# Patient Record
Sex: Female | Born: 1970 | Race: Black or African American | Hispanic: No | Marital: Single | Smoking: Current every day smoker
Health system: Southern US, Community
[De-identification: ages and names within clinical notes are randomized; demographics above are authoritative.]

## PROBLEM LIST (undated history)

## (undated) DIAGNOSIS — I10 Essential (primary) hypertension: Secondary | ICD-10-CM

## (undated) DIAGNOSIS — E119 Type 2 diabetes mellitus without complications: Secondary | ICD-10-CM

## (undated) HISTORY — PX: TUBAL LIGATION: SHX77

---

## 2001-02-19 ENCOUNTER — Inpatient Hospital Stay (HOSPITAL_COMMUNITY): Admission: RE | Admit: 2001-02-19 | Discharge: 2001-02-21 | Payer: Self-pay | Admitting: Obstetrics & Gynecology

## 2001-04-02 ENCOUNTER — Ambulatory Visit (HOSPITAL_COMMUNITY): Admission: RE | Admit: 2001-04-02 | Discharge: 2001-04-02 | Payer: Self-pay | Admitting: Obstetrics & Gynecology

## 2002-06-21 ENCOUNTER — Encounter: Payer: Self-pay | Admitting: Emergency Medicine

## 2002-06-21 ENCOUNTER — Emergency Department (HOSPITAL_COMMUNITY): Admission: EM | Admit: 2002-06-21 | Discharge: 2002-06-21 | Payer: Self-pay | Admitting: Emergency Medicine

## 2010-05-25 ENCOUNTER — Emergency Department (HOSPITAL_COMMUNITY)
Admission: EM | Admit: 2010-05-25 | Discharge: 2010-05-25 | Disposition: A | Discharge status: Home / Self Care | Payer: Self-pay | Attending: Emergency Medicine | Admitting: Emergency Medicine

## 2010-05-25 DIAGNOSIS — L03211 Cellulitis of face: Secondary | ICD-10-CM | POA: Insufficient documentation

## 2010-05-25 DIAGNOSIS — L0201 Cutaneous abscess of face: Secondary | ICD-10-CM | POA: Insufficient documentation

## 2010-05-25 DIAGNOSIS — R51 Headache: Secondary | ICD-10-CM | POA: Insufficient documentation

## 2010-05-28 LAB — CULTURE, ROUTINE-ABSCESS

## 2010-11-05 ENCOUNTER — Emergency Department (HOSPITAL_COMMUNITY)
Admission: EM | Admit: 2010-11-05 | Discharge: 2010-11-05 | Disposition: A | Discharge status: Home / Self Care | Payer: Self-pay | Attending: Emergency Medicine | Admitting: Emergency Medicine

## 2010-11-05 ENCOUNTER — Encounter: Payer: Self-pay | Admitting: *Deleted

## 2010-11-05 ENCOUNTER — Emergency Department (HOSPITAL_COMMUNITY): Payer: Self-pay

## 2010-11-05 DIAGNOSIS — M25519 Pain in unspecified shoulder: Secondary | ICD-10-CM | POA: Insufficient documentation

## 2010-11-05 DIAGNOSIS — M25512 Pain in left shoulder: Secondary | ICD-10-CM

## 2010-11-05 DIAGNOSIS — F17200 Nicotine dependence, unspecified, uncomplicated: Secondary | ICD-10-CM | POA: Insufficient documentation

## 2010-11-05 DIAGNOSIS — W1789XA Other fall from one level to another, initial encounter: Secondary | ICD-10-CM | POA: Insufficient documentation

## 2010-11-05 MED ORDER — IBUPROFEN 800 MG PO TABS: 800.0000 mg | ORAL_TABLET | Freq: Three times a day (TID) | ORAL | Status: AC

## 2010-11-05 MED ORDER — IBUPROFEN 800 MG PO TABS
800.0000 mg | ORAL_TABLET | Freq: Once | ORAL | Status: AC
  Administered 2010-11-05: 800 mg via ORAL
  Filled 2010-11-05: qty 1

## 2010-11-05 NOTE — ED Provider Notes (Signed)
History     CSN: [id number] Arrival date & time: 11/05/2010  9:44 AM   First MD Initiated Contact with Patient 11/05/10 0945      Chief Complaint  Patient presents with  . Fall  . Shoulder Pain    left    (Consider location/radiation/quality/duration/timing/severity/associated sxs/prior treatment) Patient is a 40 y.o. female presenting with fall and shoulder pain. The history is provided by the patient.  Fall Incident onset: She fell off her porch 2 weeks ago landing on her left  shoulder.   The fall occurred while walking. She fell from a height of 1 to 2 ft. She landed on grass. The point of impact was the left shoulder. The pain is present in the left shoulder. The pain is at a severity of 7/10. The pain is moderate. She was ambulatory at the scene. Pertinent negatives include no fever, no numbness, no abdominal pain, no nausea, no headaches, no loss of consciousness and no tingling. The symptoms are aggravated by rotation (Attempts to raise arm above shoulder level worsens pain.). Treatments tried: aspirin. The treatment provided no relief.  Shoulder Pain Associated symptoms include arthralgias. Pertinent negatives include no abdominal pain, chest pain, congestion, fever, headaches, joint swelling, nausea, neck pain, numbness, rash, sore throat or weakness.    History reviewed. No pertinent past medical history.  Past Surgical History  Procedure Date  . Tubal ligation     History reviewed. No pertinent family history.  History  Substance Use Topics  . Smoking status: Current Everyday Smoker  . Smokeless tobacco: Not on file  . Alcohol Use: Yes     occasionally    OB History    Grav Para Term Preterm Abortions TAB SAB Ect Mult Living                  Review of Systems  Constitutional: Negative for fever.  HENT: Negative for congestion, sore throat and neck pain.   Eyes: Negative.   Respiratory: Negative for chest tightness and shortness of breath.     Cardiovascular: Negative for chest pain.  Gastrointestinal: Negative for nausea and abdominal pain.  Genitourinary: Negative.   Musculoskeletal: Positive for arthralgias. Negative for joint swelling.  Skin: Negative.  Negative for rash and wound.  Neurological: Negative for dizziness, tingling, loss of consciousness, weakness, light-headedness, numbness and headaches.  Hematological: Negative.   Psychiatric/Behavioral: Negative.     Allergies  Review of patient's allergies indicates no known allergies.  Home Medications  No current outpatient prescriptions on file.  BP 159/108  Pulse 84  Temp(Src) 97.6 F (36.4 C) (Oral)  Resp 18  Ht 5\' 1"  (1.549 m)  Wt 220 lb (99.791 kg)  BMI 41.57 kg/m2  SpO2 100%  LMP 10/11/2010  Physical Exam  Nursing note and vitals reviewed. Constitutional: She is oriented to person, place, and time. She appears well-developed and well-nourished.  HENT:  Head: Normocephalic.  Eyes: Conjunctivae are normal.  Neck: Normal range of motion.  Cardiovascular: Normal rate and intact distal pulses.  Exam reveals no decreased pulses.   Pulses:      Dorsalis pedis pulses are 2+ on the right side, and 2+ on the left side.       Posterior tibial pulses are 2+ on the right side, and 2+ on the left side.  Pulmonary/Chest: Effort normal.  Musculoskeletal: She exhibits tenderness. She exhibits no edema.       Left shoulder: She exhibits decreased range of motion and tenderness. She exhibits no swelling,  no effusion, no crepitus, no deformity, normal pulse and normal strength.       Point tender over anterior proximal humeral head.  Neurological: She is alert and oriented to person, place, and time. No sensory deficit.  Skin: Skin is warm, dry and intact.    ED Course  Procedures (including critical care time)  Labs Reviewed - No data to display No results found.   No diagnosis found.    MDM  Patients labs and/or radiological studies were reviewed  during the medical decision making and disposition process. xrays negative.  Given injury and site of pain,  Favor rotator cuff injury as source of continued sx.  Referral to Dr Romeo Apple for further eval.          Candis Musa, PA 11/05/10 1205

## 2010-11-05 NOTE — ED Notes (Signed)
Sling applied to left arm---voices much increase in comfort with sling application.

## 2010-11-05 NOTE — ED Notes (Signed)
B/P retaken  In exam room  153/105

## 2010-11-05 NOTE — ED Notes (Signed)
Pt states she fell two weeks ago and is still having pain to left shoulder; pt states she has limited mobility to that shoulder now

## 2010-11-05 NOTE — ED Notes (Signed)
Advises she injured her left shoulder about one week ago --did not seek trt until now.  Pain is increased when arm is out stretched and she lifts the entire arm upwardly--no visible deformities--rates the pain a 7 on 1-10 scale.

## 2010-11-05 NOTE — ED Notes (Signed)
Returned from x-ray--reports slight decrease in shoulder pain 2nd to Motrin administration--awaiting x-ray results

## 2010-11-11 NOTE — ED Provider Notes (Signed)
Evaluation and management procedures were performed by the PA/NP under my supervision/collaboration.    Rhianon Zabawa D Riyansh Gerstner, MD 11/11/10 2123 

## 2012-03-12 ENCOUNTER — Emergency Department (HOSPITAL_COMMUNITY)
Admission: EM | Admit: 2012-03-12 | Discharge: 2012-03-12 | Disposition: A | Discharge status: Home / Self Care | Payer: Self-pay | Attending: Emergency Medicine | Admitting: Emergency Medicine

## 2012-03-12 ENCOUNTER — Encounter (HOSPITAL_COMMUNITY): Payer: Self-pay

## 2012-03-12 DIAGNOSIS — R220 Localized swelling, mass and lump, head: Secondary | ICD-10-CM | POA: Insufficient documentation

## 2012-03-12 DIAGNOSIS — K089 Disorder of teeth and supporting structures, unspecified: Secondary | ICD-10-CM | POA: Insufficient documentation

## 2012-03-12 DIAGNOSIS — K047 Periapical abscess without sinus: Secondary | ICD-10-CM | POA: Insufficient documentation

## 2012-03-12 DIAGNOSIS — F17200 Nicotine dependence, unspecified, uncomplicated: Secondary | ICD-10-CM | POA: Insufficient documentation

## 2012-03-12 MED ORDER — OXYCODONE-ACETAMINOPHEN 5-325 MG PO TABS: 2.0000 | ORAL_TABLET | ORAL | Status: DC | PRN

## 2012-03-12 MED ORDER — PENICILLIN V POTASSIUM 500 MG PO TABS: 500.0000 mg | ORAL_TABLET | Freq: Four times a day (QID) | ORAL | Status: AC

## 2012-03-12 MED ORDER — OXYCODONE-ACETAMINOPHEN 5-325 MG PO TABS
2.0000 | ORAL_TABLET | Freq: Once | ORAL | Status: AC
  Administered 2012-03-12: 2 via ORAL
  Filled 2012-03-12: qty 2

## 2012-03-12 NOTE — ED Notes (Signed)
Pt c/o toothache x 3 days.  Left side of face swollen.

## 2012-03-12 NOTE — ED Provider Notes (Signed)
History     CSN: [id number]  Arrival date & time 03/12/12  0719   First MD Initiated Contact with Patient 03/12/12 [phone]      Chief Complaint  Patient presents with  . Dental Pain    (Consider location/radiation/quality/duration/timing/severity/associated sxs/prior treatment) HPI Comments: Left lower molar pain and swelling for the past 3 days. No difficulty breathing or swallowing. No fevers. No vomiting. Taking ibuprofen without relief.  Patient is a 42 y.o. female presenting with tooth pain. The history is provided by the patient.  Dental PainThe primary symptoms include mouth pain. Primary symptoms do not include dental injury, headaches, fever, sore throat or cough. The symptoms began 3 to 5 days ago. The symptoms are worsening. The symptoms are new. The symptoms occur constantly.  Additional symptoms include: facial swelling. Additional symptoms do not include: trismus, trouble swallowing, pain with swallowing and drooling. Medical issues include: smoking and periodontal disease.    History reviewed. No pertinent past medical history.  Past Surgical History  Procedure Laterality Date  . Tubal ligation      No family history on file.  History  Substance Use Topics  . Smoking status: Current Every Day Smoker  . Smokeless tobacco: Not on file  . Alcohol Use: No     Comment: occasionally    OB History   Grav Para Term Preterm Abortions TAB SAB Ect Mult Living                  Review of Systems  Constitutional: Negative for fever.  HENT: Positive for facial swelling and dental problem. Negative for sore throat, drooling and trouble swallowing.   Respiratory: Negative for cough and stridor.   Gastrointestinal: Negative for nausea, vomiting and abdominal pain.  Musculoskeletal: Negative for back pain.  Neurological: Negative for headaches.  A complete 10 system review of systems was obtained and all systems are negative except as noted in the HPI and PMH.     Allergies  Review of patient's allergies indicates no known allergies.  Home Medications  No current outpatient prescriptions on file.  BP 147/93  Pulse 101  Temp(Src) 98.4 F (36.9 C) (Oral)  Resp 18  Ht 5\' 1"  (1.549 m)  Wt 225 lb (102.059 kg)  BMI 42.54 kg/m2  SpO2 100%  LMP 02/19/2012  Physical Exam  Constitutional: She is oriented to person, place, and time. She appears well-developed and well-nourished. No distress.  HENT:  Head: Normocephalic and atraumatic.  Mouth/Throat: Oropharynx is clear and moist. No oropharyngeal exudate.    Induration of gum line. No fluctuance Mild L sided facial swelling, no trismus, floor of mouth soft.  Eyes: Conjunctivae and EOM are normal. Pupils are equal, round, and reactive to light.  Neck: Normal range of motion. Neck supple.  No meningismus  Cardiovascular: Normal rate, regular rhythm and normal heart sounds.   No murmur heard. Pulmonary/Chest: Effort normal and breath sounds normal. No respiratory distress.  Abdominal: Soft. There is no tenderness. There is no rebound and no guarding.  Musculoskeletal: Normal range of motion. She exhibits no edema and no tenderness.  Lymphadenopathy:    She has no cervical adenopathy.  Neurological: She is alert and oriented to person, place, and time. No cranial nerve deficit. She exhibits normal muscle tone. Coordination normal.  Skin: Skin is warm.    ED Course  Procedures (including critical care time)  Labs Reviewed - No data to display No results found.   No diagnosis found.    MDM  Dental pain for the past 3 days. No systemic symptoms. No difficulty breathing or swallowing.  No trismus, floor of mouth soft, no evidence of Ludwig angina. Suspect early abscess. Patient declines drainage attempt today.  Antibiotics, warm compresses, pain medication, Referral to dentistry.       Glynn Octave, MD 03/12/12 [phone]

## 2012-07-22 ENCOUNTER — Emergency Department (HOSPITAL_COMMUNITY)
Admission: EM | Admit: 2012-07-22 | Discharge: 2012-07-22 | Disposition: A | Discharge status: Home / Self Care | Payer: Self-pay | Attending: Emergency Medicine | Admitting: Emergency Medicine

## 2012-07-22 ENCOUNTER — Encounter (HOSPITAL_COMMUNITY): Payer: Self-pay

## 2012-07-22 ENCOUNTER — Emergency Department (HOSPITAL_COMMUNITY): Payer: Self-pay

## 2012-07-22 DIAGNOSIS — F17200 Nicotine dependence, unspecified, uncomplicated: Secondary | ICD-10-CM | POA: Insufficient documentation

## 2012-07-22 DIAGNOSIS — Z79899 Other long term (current) drug therapy: Secondary | ICD-10-CM | POA: Insufficient documentation

## 2012-07-22 DIAGNOSIS — I10 Essential (primary) hypertension: Secondary | ICD-10-CM | POA: Insufficient documentation

## 2012-07-22 DIAGNOSIS — K029 Dental caries, unspecified: Secondary | ICD-10-CM | POA: Insufficient documentation

## 2012-07-22 DIAGNOSIS — L0201 Cutaneous abscess of face: Secondary | ICD-10-CM | POA: Insufficient documentation

## 2012-07-22 DIAGNOSIS — L03211 Cellulitis of face: Secondary | ICD-10-CM | POA: Insufficient documentation

## 2012-07-22 LAB — CBC WITH DIFFERENTIAL/PLATELET
Basophils Absolute: 0 10*3/uL (ref 0.0–0.1)
Basophils Relative: 0 % (ref 0–1)
Eosinophils Absolute: 0.1 10*3/uL (ref 0.0–0.7)
Eosinophils Relative: 1 % (ref 0–5)
HCT: 34.4 % — ABNORMAL LOW (ref 36.0–46.0)
MCH: 31.6 pg (ref 26.0–34.0)
MCHC: 34 g/dL (ref 30.0–36.0)
MCV: 93 fL (ref 78.0–100.0)
Monocytes Absolute: 0.4 10*3/uL (ref 0.1–1.0)
RDW: 13.4 % (ref 11.5–15.5)

## 2012-07-22 LAB — BASIC METABOLIC PANEL
CO2: 28 mEq/L (ref 19–32)
Calcium: 9.7 mg/dL (ref 8.4–10.5)
Creatinine, Ser: 0.78 mg/dL (ref 0.50–1.10)
GFR calc Af Amer: >90 mL/min (ref >90)
GFR calc non Af Amer: >90 mL/min (ref >90)

## 2012-07-22 MED ORDER — AMOXICILLIN 500 MG PO CAPS: 500.0000 mg | ORAL_CAPSULE | Freq: Three times a day (TID) | ORAL | Status: DC

## 2012-07-22 MED ORDER — OXYCODONE-ACETAMINOPHEN 5-325 MG PO TABS: 2.0000 | ORAL_TABLET | ORAL | Status: DC | PRN

## 2012-07-22 MED ORDER — CLINDAMYCIN PHOSPHATE 600 MG/50ML IV SOLN
600.0000 mg | Freq: Once | INTRAVENOUS | Status: AC
  Administered 2012-07-22: 600 mg via INTRAVENOUS
  Filled 2012-07-22: qty 50

## 2012-07-22 MED ORDER — SODIUM CHLORIDE 0.9 % IV BOLUS (SEPSIS)
1000.0000 mL | Freq: Once | INTRAVENOUS | Status: AC
  Administered 2012-07-22: 1000 mL via INTRAVENOUS

## 2012-07-22 MED ORDER — HYDROMORPHONE HCL PF 1 MG/ML IJ SOLN
1.0000 mg | Freq: Once | INTRAMUSCULAR | Status: AC
  Administered 2012-07-22: 1 mg via INTRAVENOUS
  Filled 2012-07-22: qty 1

## 2012-07-22 MED ORDER — IOHEXOL 300 MG/ML  SOLN
75.0000 mL | Freq: Once | INTRAMUSCULAR | Status: AC | PRN
  Administered 2012-07-22: 75 mL via INTRAVENOUS

## 2012-07-22 MED ORDER — KETOROLAC TROMETHAMINE 30 MG/ML IJ SOLN
30.0000 mg | Freq: Once | INTRAMUSCULAR | Status: AC
  Administered 2012-07-22: 30 mg via INTRAVENOUS
  Filled 2012-07-22: qty 1

## 2012-07-22 NOTE — ED Notes (Signed)
Swelling left lower jaw 

## 2012-07-22 NOTE — ED Notes (Signed)
Pt reports dental swelling to left lower jaw since yesterday, denies any fever, pt has large amount of swelling to left side of face.

## 2012-07-23 NOTE — ED Provider Notes (Signed)
History    CSN: [id number] Arrival date & time 07/22/12  1128  First MD Initiated Contact with Patient 07/22/12 1223     Chief Complaint  Patient presents with  . Oral Swelling  . Hypertension   (Consider location/radiation/quality/duration/timing/severity/associated sxs/prior Treatment) HPI.... Left facial swelling and lower toothache for 24 hours.   No fever, chills, meningeal signs. Severity is moderate. Nothing makes symptoms better or worse. No radiation of pain. History reviewed. No pertinent past medical history. Past Surgical History  Procedure Laterality Date  . Tubal ligation     No family history on file. History  Substance Use Topics  . Smoking status: Current Every Day Smoker    Types: Cigarettes  . Smokeless tobacco: Not on file  . Alcohol Use: Yes     Comment: occasionally   OB History   Grav Para Term Preterm Abortions TAB SAB Ect Mult Living                 Review of Systems  All other systems reviewed and are negative.    Allergies  Review of patient's allergies indicates no known allergies.  Home Medications   Current Outpatient Rx  Name  Route  Sig  Dispense  Refill  . acetaminophen (TYLENOL) 500 MG tablet   Oral   Take 500 mg by mouth every 6 (six) hours as needed for pain.         Marland Kitchen amoxicillin (AMOXIL) 500 MG capsule   Oral   Take 1 capsule (500 mg total) by mouth 3 (three) times daily.   30 capsule   0   . oxyCODONE-acetaminophen (PERCOCET) 5-325 MG per tablet   Oral   Take 2 tablets by mouth every 4 (four) hours as needed for pain.   20 tablet   0    BP 156/108  Pulse 90  Temp(Src) 97.6 F (36.4 C) (Oral)  Resp 16  Ht 5\' 1"  (1.549 m)  Wt 241 lb 1 oz (109.345 kg)  BMI 45.57 kg/m2  SpO2 100%  LMP 07/16/2012 Physical Exam  Nursing note and vitals reviewed. Constitutional: She is oriented to person, place, and time. She appears well-developed and well-nourished.  HENT:  Obvious caries in left lower mandible. Cheek is  edematous and tender to palpation.  Neck is supple.  Eyes: Conjunctivae and EOM are normal. Pupils are equal, round, and reactive to light.  Neck: Normal range of motion. Neck supple.  Cardiovascular: Normal rate, regular rhythm and normal heart sounds.   Pulmonary/Chest: Effort normal and breath sounds normal.  Abdominal: Soft. Bowel sounds are normal.  Musculoskeletal: Normal range of motion.  Neurological: She is alert and oriented to person, place, and time.  Skin: Skin is warm and dry.  Psychiatric: She has a normal mood and affect.    ED Course  Procedures (including critical care time) Labs Reviewed  CBC WITH DIFFERENTIAL - Abnormal; Notable for the following:    RBC 3.70 (*)    Hemoglobin 11.7 (*)    HCT 34.4 (*)    All other components within normal limits  BASIC METABOLIC PANEL - Abnormal; Notable for the following:    Glucose, Bld 109 (*)    All other components within normal limits   Ct Maxillofacial W/cm  07/22/2012   *RADIOLOGY REPORT*  Clinical Data: Left jaw and lips swelling.  Dental pain.  Query abscess.  CT MAXILLOFACIAL WITH CONTRAST  Technique:  Multidetector CT imaging of the maxillofacial structures was performed with intravenous contrast. Multiplanar  CT image reconstructions were also generated.  Contrast: 75mL OMNIPAQUE IOHEXOL 300 MG/ML  SOLN  Comparison: None.  Findings: With a periapical lucencies associated with sheath 28 and 29 as shown on image 35 of series seven.  Both of these teeth demonstrate a large amount of decay.  Cavity and periapical lucency noted in tooth #20.  There is periapical lucency and severe decay involving the remaining left posterior mandibular molar.  Mild sclerosis is present in both mandibular angle regions but without bony destructive findings aside from the periapical lucencies.  There is prominent decayed of tooth #10 along with periapical lucencies in tooth #8 and tooth #10.  Nasal mucosal swelling noted.  Paranasal sinuses clear.   Extensive phlegmon noted in the left facial soft tissues adjacent to the mandible.  Thickening and adjacent platysma muscle noted. Small reactive submandibular lymph nodes are present.  I do not observe a drainable abscess.  No significant orbital or periorbital involvement.  Parotid glands symmetric.  Parapharyngeal spaces unremarkable.  IMPRESSION:  1.  Considerable bilateral tooth decay with periapical lucencies favoring periapical abscesses.  There is some sclerosis in the mandibular angles but without definite bony destructive findings aside from the periapical lucencies.  Left facial cellulitis noted without discrete abscess observed.   Original Report Authenticated By: Gaylyn Rong, M.D.   1. Facial cellulitis     MDM  CT scan reveals no abscess.   Rx IV clindamycin.   Discharge meds amoxicillin and Percocet.   No dentist or oral surgeon on call in the [facility] system today.  Patient understands to return to the emergency department if worse  Donnetta Hutching, MD 07/23/12 [phone]

## 2014-03-13 ENCOUNTER — Other Ambulatory Visit (HOSPITAL_COMMUNITY): Payer: Self-pay

## 2014-03-13 ENCOUNTER — Inpatient Hospital Stay (HOSPITAL_COMMUNITY)
Admission: EM | Admit: 2014-03-13 | Discharge: 2014-03-16 | DRG: 638 | Disposition: A | Discharge status: Home / Self Care | Payer: Self-pay | Attending: Internal Medicine | Admitting: Internal Medicine

## 2014-03-13 ENCOUNTER — Emergency Department (HOSPITAL_COMMUNITY): Payer: Self-pay

## 2014-03-13 ENCOUNTER — Encounter (HOSPITAL_COMMUNITY): Payer: Self-pay | Admitting: Emergency Medicine

## 2014-03-13 DIAGNOSIS — Z720 Tobacco use: Secondary | ICD-10-CM | POA: Diagnosis present

## 2014-03-13 DIAGNOSIS — Z6841 Body Mass Index (BMI) 40.0 and over, adult: Secondary | ICD-10-CM

## 2014-03-13 DIAGNOSIS — E669 Obesity, unspecified: Secondary | ICD-10-CM | POA: Diagnosis present

## 2014-03-13 DIAGNOSIS — Z833 Family history of diabetes mellitus: Secondary | ICD-10-CM

## 2014-03-13 DIAGNOSIS — E1100 Type 2 diabetes mellitus with hyperosmolarity without nonketotic hyperglycemic-hyperosmolar coma (NKHHC): Principal | ICD-10-CM | POA: Diagnosis present

## 2014-03-13 DIAGNOSIS — E1101 Type 2 diabetes mellitus with hyperosmolarity with coma: Secondary | ICD-10-CM | POA: Insufficient documentation

## 2014-03-13 DIAGNOSIS — F17210 Nicotine dependence, cigarettes, uncomplicated: Secondary | ICD-10-CM | POA: Diagnosis present

## 2014-03-13 LAB — CBC
HCT: 40.8 % (ref 36.0–46.0)
Hemoglobin: 13.5 g/dL (ref 12.0–15.0)
MCH: 32 pg (ref 26.0–34.0)
MCHC: 33.1 g/dL (ref 30.0–36.0)
MCV: 96.7 fL (ref 78.0–100.0)
PLATELETS: 280 10*3/uL (ref 150–400)
RBC: 4.22 MIL/uL (ref 3.87–5.11)
RDW: 13.2 % (ref 11.5–15.5)
WBC: 6.1 10*3/uL (ref 4.0–10.5)

## 2014-03-13 LAB — COMPREHENSIVE METABOLIC PANEL
ALK PHOS: 88 U/L (ref 39–117)
ALT: 43 U/L — ABNORMAL HIGH (ref 0–35)
ANION GAP: 9 (ref 5–15)
AST: 41 U/L — ABNORMAL HIGH (ref 0–37)
Albumin: 4.5 g/dL (ref 3.5–5.2)
BILIRUBIN TOTAL: 1 mg/dL (ref 0.3–1.2)
BUN: 11 mg/dL (ref 6–23)
CHLORIDE: 94 mmol/L — AB (ref 96–112)
CO2: 24 mmol/L (ref 19–32)
Calcium: 9.3 mg/dL (ref 8.4–10.5)
Creatinine, Ser: 1.29 mg/dL — ABNORMAL HIGH (ref 0.50–1.10)
GFR, EST AFRICAN AMERICAN: 59 mL/min — AB (ref >90)
GFR, EST NON AFRICAN AMERICAN: 51 mL/min — AB (ref >90)
Glucose, Bld: 825 mg/dL (ref 70–99)
Potassium: 4.1 mmol/L (ref 3.5–5.1)
SODIUM: 127 mmol/L — AB (ref 135–145)
Total Protein: 8.2 g/dL (ref 6.0–8.3)

## 2014-03-13 LAB — BLOOD GAS, VENOUS
Acid-base deficit: 2 mmol/L (ref 0.0–2.0)
BICARBONATE: 22.4 meq/L (ref 20.0–24.0)
Drawn by: 105551
FIO2: 0.21 %
O2 SAT: 78 %
PATIENT TEMPERATURE: 37
PO2 VEN: 42.9 mmHg (ref 30.0–45.0)
TCO2: 18.7 mmol/L (ref 0–100)
pCO2, Ven: 39.3 mmHg — ABNORMAL LOW (ref 45.0–50.0)
pH, Ven: 7.374 — ABNORMAL HIGH (ref 7.250–7.300)

## 2014-03-13 LAB — URINALYSIS, ROUTINE W REFLEX MICROSCOPIC
Bilirubin Urine: NEGATIVE
Glucose, UA: 250 mg/dL — AB
KETONES UR: 15 mg/dL — AB
LEUKOCYTES UA: NEGATIVE
NITRITE: NEGATIVE
PH: 5.5 (ref 5.0–8.0)
PROTEIN: NEGATIVE mg/dL
Specific Gravity, Urine: <1.005 — ABNORMAL LOW (ref 1.005–1.030)
UROBILINOGEN UA: 0.2 mg/dL (ref 0.0–1.0)

## 2014-03-13 LAB — PREGNANCY, URINE: PREG TEST UR: NEGATIVE

## 2014-03-13 LAB — MRSA PCR SCREENING: MRSA BY PCR: NEGATIVE

## 2014-03-13 LAB — URINE MICROSCOPIC-ADD ON

## 2014-03-13 LAB — LACTIC ACID, PLASMA: LACTIC ACID, VENOUS: 2 mmol/L (ref 0.5–2.0)

## 2014-03-13 LAB — CBG MONITORING, ED: Glucose-Capillary: >600 mg/dL (ref 70–99)

## 2014-03-13 MED ORDER — SODIUM CHLORIDE 0.9 % IV SOLN
INTRAVENOUS | Status: DC
  Filled 2014-03-13: qty 2.5

## 2014-03-13 MED ORDER — SODIUM CHLORIDE 0.9 % IV BOLUS (SEPSIS)
1000.0000 mL | Freq: Once | INTRAVENOUS | Status: AC
  Administered 2014-03-13: 1000 mL via INTRAVENOUS

## 2014-03-13 MED ORDER — SODIUM CHLORIDE 0.9 % IV SOLN: INTRAVENOUS | Status: DC

## 2014-03-13 MED ORDER — SODIUM CHLORIDE 0.9 % IV SOLN
INTRAVENOUS | Status: DC
  Administered 2014-03-13 – 2014-03-16 (×6): via INTRAVENOUS

## 2014-03-13 MED ORDER — DEXTROSE-NACL 5-0.45 % IV SOLN: INTRAVENOUS | Status: DC

## 2014-03-13 MED ORDER — MORPHINE SULFATE 2 MG/ML IJ SOLN: 1.0000 mg | INTRAMUSCULAR | Status: DC | PRN

## 2014-03-13 MED ORDER — INSULIN REGULAR HUMAN 100 UNIT/ML IJ SOLN
INTRAMUSCULAR | Status: DC
  Administered 2014-03-13: 5.4 [IU]/h via INTRAVENOUS
  Filled 2014-03-13: qty 2.5

## 2014-03-13 MED ORDER — SODIUM CHLORIDE 0.9 % IV SOLN: INTRAVENOUS | Status: AC

## 2014-03-13 MED ORDER — HEPARIN SODIUM (PORCINE) 5000 UNIT/ML IJ SOLN
5000.0000 [IU] | Freq: Three times a day (TID) | INTRAMUSCULAR | Status: DC
  Administered 2014-03-13 – 2014-03-16 (×6): 5000 [IU] via SUBCUTANEOUS
  Filled 2014-03-13 (×8): qty 1

## 2014-03-13 MED ORDER — POTASSIUM CHLORIDE 10 MEQ/100ML IV SOLN
10.0000 meq | INTRAVENOUS | Status: DC
  Administered 2014-03-13 (×2): 10 meq via INTRAVENOUS
  Filled 2014-03-13 (×4): qty 100

## 2014-03-13 NOTE — ED Provider Notes (Signed)
CSN: [id number]     Arrival date & time 03/13/14  1745 History   First MD Initiated Contact with Patient 03/13/14 1902     Chief Complaint  Patient presents with  . Hyperglycemia     (Consider location/radiation/quality/duration/timing/severity/associated sxs/prior Treatment) HPI Comments: Patient reports dry mouth for the past 2 days. She endorses urinary frequency and urgency. She denies any cough, chills, fever, sore throat, nausea or vomiting. She has some trouble swallowing his dry. Denies any history of diabetes. Denies any chronic medical problems and takes no medications.  The history is provided by the patient.    History reviewed. No pertinent past medical history. Past Surgical History  Procedure Laterality Date  . Tubal ligation     History reviewed. No pertinent family history. History  Substance Use Topics  . Smoking status: Current Every Day Smoker -- 1.00 packs/day    Types: Cigarettes  . Smokeless tobacco: Not on file  . Alcohol Use: Yes     Comment: occasionally   OB History    No data available     Review of Systems  Constitutional: Positive for activity change, appetite change and fatigue. Negative for fever.  Respiratory: Negative for cough, chest tightness and shortness of breath.   Cardiovascular: Negative for chest pain.  Gastrointestinal: Negative for nausea, vomiting and abdominal pain.  Genitourinary: Negative for dysuria and hematuria.  Musculoskeletal: Negative for myalgias and arthralgias.  Skin: Negative for rash.  Neurological: Negative for dizziness, weakness and headaches.  A complete 10 system review of systems was obtained and all systems are negative except as noted in the HPI and PMH.      Allergies  Review of patient's allergies indicates no known allergies.  Home Medications   Prior to Admission medications   Medication Sig Start Date End Date Taking? Authorizing Provider  acetaminophen (TYLENOL) 500 MG tablet Take 500 mg  by mouth every 6 (six) hours as needed for pain.   Yes Historical Provider, MD  amoxicillin (AMOXIL) 500 MG capsule Take 1 capsule (500 mg total) by mouth 3 (three) times daily. Patient not taking: Reported on 03/13/2014 07/22/12   Donnetta Hutching, MD  oxyCODONE-acetaminophen (PERCOCET) 5-325 MG per tablet Take 2 tablets by mouth every 4 (four) hours as needed for pain. Patient not taking: Reported on 03/13/2014 07/22/12   Donnetta Hutching, MD   BP 126/71 mmHg  Pulse 122  Temp(Src) 97.8 F (36.6 C) (Oral)  Resp 18  Ht  (1.549 m)  Wt 220 lb (99.791 kg)  BMI 41.59 kg/m2  SpO2 100%  LMP 03/11/2014 Physical Exam  Constitutional: She is oriented to person, place, and time. She appears well-developed and well-nourished. No distress.  HENT:  Head: Normocephalic and atraumatic.  Mouth/Throat: Oropharynx is clear and moist. No oropharyngeal exudate.  Dry mucus membranes  Eyes: Conjunctivae and EOM are normal. Pupils are equal, round, and reactive to light.  Neck: Normal range of motion. Neck supple.  No meningismus.  Cardiovascular: Normal rate, normal heart sounds and intact distal pulses.   No murmur heard. tachycardic  Pulmonary/Chest: Effort normal and breath sounds normal. No respiratory distress.  Abdominal: Soft. There is no tenderness. There is no rebound and no guarding.  Musculoskeletal: Normal range of motion. She exhibits no edema or tenderness.  Neurological: She is alert and oriented to person, place, and time. No cranial nerve deficit. She exhibits normal muscle tone. Coordination normal.  No ataxia on finger to nose bilaterally. No pronator drift. 5/5 strength throughout. CN  2-12 intact. Negative Romberg. Equal grip strength. Sensation intact. Gait is normal.   Skin: Skin is warm.  Psychiatric: She has a normal mood and affect. Her behavior is normal.  Nursing note and vitals reviewed.   ED Course  Procedures (including critical care time) Labs Review Labs Reviewed  COMPREHENSIVE  METABOLIC PANEL - Abnormal; Notable for the following:    Sodium 127 (*)    Chloride 94 (*)    Glucose, Bld 825 (*)    Creatinine, Ser 1.29 (*)    AST 41 (*)    ALT 43 (*)    GFR calc non Af Amer 51 (*)    GFR calc Af Amer 59 (*)    All other components within normal limits  URINALYSIS, ROUTINE W REFLEX MICROSCOPIC - Abnormal; Notable for the following:    Color, Urine STRAW (*)    Specific Gravity, Urine <1.005 (*)    Glucose, UA 250 (*)    Hgb urine dipstick LARGE (*)    Ketones, ur 15 (*)    All other components within normal limits  BLOOD GAS, VENOUS - Abnormal; Notable for the following:    pH, Ven 7.374 (*)    pCO2, Ven 39.3 (*)    All other components within normal limits  URINE MICROSCOPIC-ADD ON - Abnormal; Notable for the following:    Squamous Epithelial / LPF MANY (*)    All other components within normal limits  CBG MONITORING, ED - Abnormal; Notable for the following:    Glucose-Capillary >600 (*)    All other components within normal limits  MRSA PCR SCREENING  CBC  PREGNANCY, URINE  LACTIC ACID, PLASMA  BETA-HYDROXYBUTYRIC ACID  BASIC METABOLIC PANEL  BASIC METABOLIC PANEL  BASIC METABOLIC PANEL    Imaging Review Dg Chest 2 View  03/13/2014   CLINICAL DATA:  Dry mouth for 2 days. Hyperglycemia. Current smoker. Patient states she is diabetic.  EXAM: CHEST  2 VIEW  COMPARISON:  None.  FINDINGS: The heart size and mediastinal contours are within normal limits. Both lungs are clear. The visualized skeletal structures are unremarkable.  IMPRESSION: No active cardiopulmonary disease.   Electronically Signed   By: Burman NievesWilliam  Stevens M.D.   On: 03/13/2014 20:13     EKG Interpretation None      MDM   Final diagnoses:  Hyperglycemic hyperosmolar nonketotic coma    3 days of dry mouth and increased urination. Patient is tachycardic. CBG greater than 600. Reports no history of diabetes. She is drinking [employer]ountain [employer].  Anion gap is normal. Hyperglycemia with an  800. Patient started on IV fluids and IV insulin. No evidence of infection.  Admission discussed with Dr. Karilyn CotaGosrani.  CRITICAL CARE Performed by: Glynn OctaveANCOUR, Birdia Jaycox Total critical care time: 30 Critical care time was exclusive of separately billable procedures and treating other patients. Critical care was necessary to treat or prevent imminent or life-threatening deterioration. Critical care was time spent personally by me on the following activities: development of treatment plan with patient and/or surrogate as well as nursing, discussions with consultants, evaluation of patient's response to treatment, examination of patient, obtaining history from patient or surrogate, ordering and performing treatments and interventions, ordering and review of laboratory studies, ordering and review of radiographic studies, pulse oximetry and re-evaluation of patient's condition.   Glynn OctaveStephen Kierre Deines, MD 03/13/14 [phone]

## 2014-03-13 NOTE — H&P (Signed)
[facility] History and Physical  Megan Mullins YQM:[id number] DOB: [birth year] DOA: 03/13/2014  Referring physician: ER PCP: No PCP Per Patient   Chief Complaint: Polydipsia, polyuria.  HPI: Megan Mullins is a 44 y.o. female  This is a 44 year old lady who presents because of polyuria, polydipsia, dry mouth for last 3 days. She denies any nausea, vomiting, diarrhea, fever. She denies any chest pain, dyspnea or palpitations. There is no cough. There is no weight loss. She does have a family history of diabetes. Evaluation in the emergency room showed her to be newly diagnosed diabetic. She is now being admitted for further management.   Review of Systems:  Apart from symptoms above, all systems negative.   History reviewed. No pertinent past medical history. Past Surgical History  Procedure Laterality Date  . Tubal ligation     Social History:  reports that she has been smoking Cigarettes.  She has been smoking about 1.00 pack per day. She does not have any smokeless tobacco history on file. She reports that she drinks alcohol. She reports that she does not use illicit drugs.  No Known Allergies   FH: her mother and sister both have diabetes.   Prior to Admission medications   Medication Sig Start Date End Date Taking? Authorizing Provider  acetaminophen (TYLENOL) 500 MG tablet Take 500 mg by mouth every 6 (six) hours as needed for pain.   Yes Historical Provider, MD  amoxicillin (AMOXIL) 500 MG capsule Take 1 capsule (500 mg total) by mouth 3 (three) times daily. Patient not taking: Reported on 03/13/2014 07/22/12   Donnetta Hutching, MD  oxyCODONE-acetaminophen (PERCOCET) 5-325 MG per tablet Take 2 tablets by mouth every 4 (four) hours as needed for pain. Patient not taking: Reported on 03/13/2014 07/22/12   Donnetta Hutching, MD   Physical Exam: Filed Vitals:   03/13/14 1751  BP: 126/71  Pulse: 122  Temp: 97.8 F (36.6 C)  TempSrc: Oral  Resp: 18  Height:  (1.549 m)  Weight:  99.791 kg (220 lb)  SpO2: 100%    Wt Readings from Last 3 Encounters:  03/13/14 99.791 kg (220 lb)  07/22/12 109.345 kg (241 lb 1 oz)  03/12/12 102.059 kg (225 lb)    General:  Appears calm and comfortable. She does not look clinically dehydrated. She is alert and oriented.  Eyes: PERRL, normal lids, irises & conjunctiva ENT: grossly normal hearing, lips & tongue Neck: no LAD, masses or thyromegaly Cardiovascular: RRR, no m/r/g. No LE edema. Telemetry: SR, no arrhythmias  Respiratory: CTA bilaterally, no w/r/r. Normal respiratory effort. Abdomen: soft, ntnd Skin: no rash or induration seen on limited exam Musculoskeletal: grossly normal tone BUE/BLE Psychiatric: grossly normal mood and affect, speech fluent and appropriate Neurologic: grossly non-focal.          Labs on Admission:  Basic Metabolic Panel:  Recent Labs Lab 03/13/14 1839  NA 127*  K 4.1  CL 94*  CO2 24  GLUCOSE 825*  BUN 11  CREATININE 1.29*  CALCIUM 9.3   Liver Function Tests:  Recent Labs Lab 03/13/14 1839  AST 41*  ALT 43*  ALKPHOS 88  BILITOT 1.0  PROT 8.2  ALBUMIN 4.5   No results for input(s): LIPASE, AMYLASE in the last 168 hours. No results for input(s): AMMONIA in the last 168 hours. CBC:  Recent Labs Lab 03/13/14 1839  WBC 6.1  HGB 13.5  HCT 40.8  MCV 96.7  PLT 280   Cardiac Enzymes: No results for  input(s): CKTOTAL, CKMB, CKMBINDEX, TROPONINI in the last 168 hours.  BNP (last 3 results) No results for input(s): BNP in the last 8760 hours.  ProBNP (last 3 results) No results for input(s): PROBNP in the last 8760 hours.  CBG:  Recent Labs Lab 03/13/14 1932  GLUCAP >600*    Radiological Exams on Admission: Dg Chest 2 View  03/13/2014   CLINICAL DATA:  Dry mouth for 2 days. Hyperglycemia. Current smoker. Patient states she is diabetic.  EXAM: CHEST  2 VIEW  COMPARISON:  None.  FINDINGS: The heart size and mediastinal contours are within normal limits. Both lungs  are clear. The visualized skeletal structures are unremarkable.  IMPRESSION: No active cardiopulmonary disease.   Electronically Signed   By: Burman NievesWilliam  Stevens M.D.   On: 03/13/2014 20:13      Assessment/Plan  1. Newly diagnosed type 2 diabetes mellitus with hyperosmolar nonketotic hyperglycemia. She will be treated with intravenous insulin, intravenous fluids. Once her glucose stabilizes, she will need to be started on oral hypoglycemic agent. She will need diabetic indication, in particular nutrition.  2. Tobacco abuse. I counseled her to stop smoking.  Code Status:Full code.   DVT Prophylaxis: heparin   Family Communication: I discussed the plan with the patient at the bedside.  Disposition Home when medically stable.  Time spent: 60 minutes  [facility] - [facility] One ResidenceGOSRANI,NIMISH C [facility] Pager [phone]

## 2014-03-13 NOTE — ED Notes (Signed)
Pt reports dry mouth x2 days. Pt reports cbg at home 601. Pt denies any pain or other symptoms.

## 2014-03-13 NOTE — ED Notes (Signed)
Attempted to obtain insulin drip several drip several time but no answer

## 2014-03-13 NOTE — ED Notes (Signed)
CBG in triage resulted "HIGH".

## 2014-03-14 ENCOUNTER — Encounter (HOSPITAL_COMMUNITY): Payer: Self-pay | Admitting: *Deleted

## 2014-03-14 LAB — BASIC METABOLIC PANEL
ANION GAP: 5 (ref 5–15)
Anion gap: 3 — ABNORMAL LOW (ref 5–15)
Anion gap: 5 (ref 5–15)
Anion gap: 9 (ref 5–15)
Anion gap: 3 — ABNORMAL LOW (ref 5–15)
BUN: 10 mg/dL (ref 6–23)
BUN: 11 mg/dL (ref 6–23)
BUN: 10 mg/dL (ref 6–23)
BUN: 12 mg/dL (ref 6–23)
BUN: 13 mg/dL (ref 6–23)
CALCIUM: 8.4 mg/dL (ref 8.4–10.5)
CHLORIDE: 107 mmol/L (ref 96–112)
CO2: 26 mmol/L (ref 19–32)
CO2: 23 mmol/L (ref 19–32)
CO2: 23 mmol/L (ref 19–32)
CO2: 22 mmol/L (ref 19–32)
CO2: 21 mmol/L (ref 19–32)
CREATININE: 1.05 mg/dL (ref 0.50–1.10)
CREATININE: 1.03 mg/dL (ref 0.50–1.10)
CREATININE: 0.96 mg/dL (ref 0.50–1.10)
Calcium: 9.2 mg/dL (ref 8.4–10.5)
Calcium: 8.9 mg/dL (ref 8.4–10.5)
Calcium: 8.6 mg/dL (ref 8.4–10.5)
Calcium: 8.3 mg/dL — ABNORMAL LOW (ref 8.4–10.5)
Chloride: 108 mmol/L (ref 96–112)
Chloride: 108 mmol/L (ref 96–112)
Chloride: 103 mmol/L (ref 96–112)
Chloride: 108 mmol/L (ref 96–112)
Creatinine, Ser: 0.99 mg/dL (ref 0.50–1.10)
Creatinine, Ser: 1.01 mg/dL (ref 0.50–1.10)
GFR calc Af Amer: 77 mL/min — ABNORMAL LOW (ref >90)
GFR calc non Af Amer: 65 mL/min — ABNORMAL LOW (ref >90)
GFR calc non Af Amer: 68 mL/min — ABNORMAL LOW (ref >90)
GFR calc non Af Amer: 72 mL/min — ABNORMAL LOW (ref >90)
GFR, EST AFRICAN AMERICAN: 81 mL/min — AB (ref >90)
GFR, EST AFRICAN AMERICAN: 75 mL/min — AB (ref >90)
GFR, EST AFRICAN AMERICAN: 79 mL/min — AB (ref >90)
GFR, EST AFRICAN AMERICAN: 84 mL/min — AB (ref >90)
GFR, EST NON AFRICAN AMERICAN: 70 mL/min — AB (ref >90)
GFR, EST NON AFRICAN AMERICAN: 67 mL/min — AB (ref >90)
GLUCOSE: 309 mg/dL — AB (ref 70–99)
Glucose, Bld: 193 mg/dL — ABNORMAL HIGH (ref 70–99)
Glucose, Bld: 288 mg/dL — ABNORMAL HIGH (ref 70–99)
Glucose, Bld: 309 mg/dL — ABNORMAL HIGH (ref 70–99)
Glucose, Bld: 403 mg/dL — ABNORMAL HIGH (ref 70–99)
POTASSIUM: 3.6 mmol/L (ref 3.5–5.1)
POTASSIUM: 3.6 mmol/L (ref 3.5–5.1)
Potassium: 3.2 mmol/L — ABNORMAL LOW (ref 3.5–5.1)
Potassium: 4.2 mmol/L (ref 3.5–5.1)
Potassium: 3.5 mmol/L (ref 3.5–5.1)
SODIUM: 135 mmol/L (ref 135–145)
SODIUM: 136 mmol/L (ref 135–145)
SODIUM: 132 mmol/L — AB (ref 135–145)
Sodium: 137 mmol/L (ref 135–145)
Sodium: 134 mmol/L — ABNORMAL LOW (ref 135–145)

## 2014-03-14 LAB — GLUCOSE, CAPILLARY
GLUCOSE-CAPILLARY: 422 mg/dL — AB (ref 70–99)
GLUCOSE-CAPILLARY: 267 mg/dL — AB (ref 70–99)
GLUCOSE-CAPILLARY: 307 mg/dL — AB (ref 70–99)
Glucose-Capillary: 224 mg/dL — ABNORMAL HIGH (ref 70–99)
Glucose-Capillary: 224 mg/dL — ABNORMAL HIGH (ref 70–99)
Glucose-Capillary: 286 mg/dL — ABNORMAL HIGH (ref 70–99)
Glucose-Capillary: 335 mg/dL — ABNORMAL HIGH (ref 70–99)
Glucose-Capillary: 342 mg/dL — ABNORMAL HIGH (ref 70–99)
Glucose-Capillary: 342 mg/dL — ABNORMAL HIGH (ref 70–99)
Glucose-Capillary: 292 mg/dL — ABNORMAL HIGH (ref 70–99)
Glucose-Capillary: 340 mg/dL — ABNORMAL HIGH (ref 70–99)

## 2014-03-14 LAB — BETA-HYDROXYBUTYRIC ACID: BETA-HYDROXYBUTYRIC ACID: 2.01 mmol/L — AB (ref 0.05–0.27)

## 2014-03-14 MED ORDER — INSULIN ASPART 100 UNIT/ML ~~LOC~~ SOLN
5.0000 [IU] | Freq: Once | SUBCUTANEOUS | Status: AC
Start: 2014-03-14 — End: 2014-03-14
  Administered 2014-03-14: 5 [IU] via SUBCUTANEOUS

## 2014-03-14 MED ORDER — INSULIN GLARGINE 100 UNIT/ML ~~LOC~~ SOLN
10.0000 [IU] | Freq: Every day | SUBCUTANEOUS | Status: DC
  Administered 2014-03-14: 10 [IU] via SUBCUTANEOUS
  Filled 2014-03-14 (×3): qty 0.1

## 2014-03-14 MED ORDER — METFORMIN HCL 500 MG PO TABS
500.0000 mg | ORAL_TABLET | Freq: Two times a day (BID) | ORAL | Status: DC
  Administered 2014-03-14 – 2014-03-16 (×4): 500 mg via ORAL
  Filled 2014-03-14 (×4): qty 1

## 2014-03-14 MED ORDER — INSULIN GLARGINE 100 UNIT/ML ~~LOC~~ SOLN
12.0000 [IU] | Freq: Every day | SUBCUTANEOUS | Status: DC
  Administered 2014-03-15 – 2014-03-16 (×2): 12 [IU] via SUBCUTANEOUS
  Filled 2014-03-14 (×3): qty 0.12

## 2014-03-14 MED ORDER — INSULIN ASPART 100 UNIT/ML ~~LOC~~ SOLN
0.0000 [IU] | Freq: Three times a day (TID) | SUBCUTANEOUS | Status: DC
  Administered 2014-03-14: 7 [IU] via SUBCUTANEOUS
  Administered 2014-03-14 (×2): 5 [IU] via SUBCUTANEOUS
  Administered 2014-03-15: 3 [IU] via SUBCUTANEOUS
  Administered 2014-03-15 – 2014-03-16 (×4): 5 [IU] via SUBCUTANEOUS

## 2014-03-14 MED ORDER — POTASSIUM CHLORIDE CRYS ER 20 MEQ PO TBCR
40.0000 meq | EXTENDED_RELEASE_TABLET | Freq: Once | ORAL | Status: AC
  Administered 2014-03-14: 40 meq via ORAL
  Filled 2014-03-14: qty 2

## 2014-03-14 MED ORDER — LIVING WELL WITH DIABETES BOOK
Freq: Once | Status: AC
  Administered 2014-03-14: 12:00:00
  Filled 2014-03-14: qty 1

## 2014-03-14 NOTE — Progress Notes (Signed)
1730 Patient educated on proper technique to administer insulin. Patient adequately demonstrated how to cleanse selected area with alcohol swab, pinch up selected SQ tissue and administer insulin needle into the skin. Patient tolerated this well and performed this skill appropriately. Patient educated on areas of the body she can give herself insulin including inner thigh region, back of arm and abdominal tissue. Patient administered herself insulin to RIGHT inner thigh SQ tissue and was comfortable with that area she reported.

## 2014-03-14 NOTE — Progress Notes (Signed)
Consult Acknowledged  Rd not on site today. Will f/u for education regarding diabetic education on Monday if pt still in hospital.  Christophe LouisNathan Elleah Hemsley RD, LDN Nutrition Pager: [phone] 03/14/2014 11:30 AM

## 2014-03-14 NOTE — Progress Notes (Signed)
[facility] PROGRESS NOTE  Megan Mullins ZOX:[id number] DOB: Dec 16, 1970 DOA: 03/13/2014 PCP: No PCP Per Patient  Assessment/Plan: New Onset DM/HONK -Transitioned off insulin drip overnight. -Likely is a Type II and would hope to eventually be able to control with oral meds only. -For now will need insulin to get under better control. -Education today on diet, insulin administration and CBG checks. -Hopes to go home in am.  Morbid Obesity  Tobacco Abuse -Counseled on cessation.  Code Status: Full Code Family Communication: Patient only  Disposition Plan: Home in am   Consultants:  None   Antibiotics:  None   Subjective: No complaints.  Objective: Filed Vitals:   03/14/14 1100 03/14/14 1130 03/14/14 1200 03/14/14 1456  BP: 127/100  134/107 126/75  Pulse: 88  97 88  Temp:  97.9 F (36.6 C)  97.7 F (36.5 C)  TempSrc:  Oral  Oral  Resp: Height:      Weight:      SpO2: 100%  100% 99%    Intake/Output Summary (Last 24 hours) at 03/14/14 1858 Last data filed at 03/14/14 1500  Gross per 24 hour  Intake 2307.5 ml  Output    750 ml  Net 1557.5 ml   Filed Weights   03/13/14 1751 03/14/14 0400  Weight: 99.791 kg (220 lb) 101 kg (222 lb 10.6 oz)    Exam:   General:  AA Ox3  Cardiovascular: RRR  Respiratory: CTA B  Abdomen: obese/S/NT/ND/+BS  Extremities: no C/C/E   Neurologic:  Intact/non-focal  Data Reviewed: Basic Metabolic Panel:  Recent Labs Lab 03/14/14 0234 03/14/14 0518 03/14/14 0857 03/14/14 1311 03/14/14 1636  NA 137 135 136 134* 132*  K 3.2* 4.2 3.6 3.6 3.5  CL 108 107 108 103 108  CO2 GLUCOSE 193* 288* 309* 403* 309*  BUN CREATININE 0.99 1.05 1.03 1.01 0.96  CALCIUM 9.2 8.4 8.9 8.6 8.3*   Liver Function Tests:  Recent Labs Lab 03/13/14 1839  AST 41*  ALT 43*  ALKPHOS 88  BILITOT 1.0  PROT 8.2  ALBUMIN 4.5   No results for input(s): LIPASE, AMYLASE in the last  168 hours. No results for input(s): AMMONIA in the last 168 hours. CBC:  Recent Labs Lab 03/13/14 1839  WBC 6.1  HGB 13.5  HCT 40.8  MCV 96.7  PLT 280   Cardiac Enzymes: No results for input(s): CKTOTAL, CKMB, CKMBINDEX, TROPONINI in the last 168 hours. BNP (last 3 results) No results for input(s): BNP in the last 8760 hours.  ProBNP (last 3 results) No results for input(s): PROBNP in the last 8760 hours.  CBG:  Recent Labs Lab 03/14/14 0205 03/14/14 0341 03/14/14 0751 03/14/14 1206 03/14/14 1658  GLUCAP 224* 224* 267* 292* 307*    Recent Results (from the past 240 hour(s))  MRSA PCR Screening     Status: None   Collection Time: 03/13/14  9:28 PM  Result Value Ref Range Status   MRSA by PCR NEGATIVE NEGATIVE Final    Comment:        The GeneXpert MRSA Assay (FDA approved for NASAL specimens only), is one component of a comprehensive MRSA colonization surveillance program. It is not intended to diagnose MRSA infection nor to guide or monitor treatment for MRSA infections.      Studies: Dg Chest 2 View  03/13/2014   CLINICAL DATA:  Dry mouth for 2 days.  Hyperglycemia. Current smoker. Patient states she is diabetic.  EXAM: CHEST  2 VIEW  COMPARISON:  None.  FINDINGS: The heart size and mediastinal contours are within normal limits. Both lungs are clear. The visualized skeletal structures are unremarkable.  IMPRESSION: No active cardiopulmonary disease.   Electronically Signed   By: Burman NievesWilliam  Stevens M.D.   On: 03/13/2014 20:13    Scheduled Meds: . heparin  5,000 Units Subcutaneous 3 times per day  . insulin aspart  0-9 Units Subcutaneous TID WC  . [START ON 03/15/2014] insulin glargine  12 Units Subcutaneous Daily  . metFORMIN  500 mg Oral BID WC   Continuous Infusions: . sodium chloride 100 mL/hr at 03/14/14 1729    Active Problems:   Type 2 diabetes mellitus with hyperosmolar nonketotic hyperglycemia   Obesity   Tobacco abuse    Time spent: 35  minutes. Greater than 50% of this time was spent in direct contact with the patient coordinating care.    Chaya JanHERNANDEZ ACOSTA,ESTELA  [facility] Pager [phone]  If 7PM-7AM, please contact night-coverage at [email or web address], password [email or web address] 03/14/2014, 6:58 PM  LOS: 1 day

## 2014-03-14 NOTE — Plan of Care (Signed)
Problem: Phase I Progression Outcomes Goal: CBGs steadily decreasing with treatment Outcome: Progressing Insulin drip being titrated to bring blood sugar down  Goal: NPO or per MD order Outcome: Not Applicable Date Met:  63/78/58 Carb modified diet Goal: Pain controlled with appropriate interventions Outcome: Progressing Patient denies pain at this time Goal: OOB as tolerated unless otherwise ordered Outcome: Progressing Steady gait Goal: Initial discharge plan identified Outcome: [place] with family Goal: Hemodynamically stable Outcome: Progressing Vital Signs Stable

## 2014-03-14 NOTE — Progress Notes (Signed)
1425 New order given per MD to d/c cardiac monitoring at this time.

## 2014-03-14 NOTE — Progress Notes (Signed)
Pt is a newly diagnosed diabetic. I have paged the on call diabetes coordinator three times this morning with no return call. I will page one more time before transferring pt to med-surg floor. Pt/family is aware and agreeable to transfer. Assessment is unchanged from this morning and receiving RN has been given report. Belongings sent with pt at bedside.

## 2014-03-14 NOTE — Progress Notes (Signed)
Inpatient Diabetes Program Recommendations  AACE/ADA: New Consensus Statement on Inpatient Glycemic Control (2013)  Target Ranges:  Prepandial:   less than 140 mg/dL      Peak postprandial:   less than 180 mg/dL (1-2 hours)      Critically ill patients:  140 - 180 mg/dL   Consult received regarding new onset dm 2. Glucose level up into 400's since insulin d   Inpatient Diabetes Program Recommendations Correction (SSI): Please increase to resistant correction tidwc and add the HS scale.  Called patient and discussed with her what she needs to review on the TV education network, review the Ed booklet, Living Well with Diabetes and to practice checking her own blood sugar with the floor meter. Pt states she is familiar with this as she has helped her mother check hers in the past;however, she is not familiar with the strip technology as to drawing the drop of blood into the strip. Pt [facility]bg [facility] in order to check her cbg's at home. Pt does not have insurance nor a PCP. Ordered a care management consult to assist with f/up care when discharged from the hospital. Also ordered a dietician consult for basic diabetes nutrition. Ed booklet ordered and requested RN's and CMA's to assist with education. Pt can get education at [facility]. Have.attempted to talk with RN regarding basic ed assistance-she was unable to answer the call. Will continue to try to contact and write a sticky note to the team management.  Will follow and glad to assist.  Thank you Lenor CoffinAnn Ariyanna Oien, RN, MSN, CDE  Diabetes Inpatient Program Office: [phone] Pager: [phone] 8:00 am to 5:00 pm

## 2014-03-14 NOTE — Progress Notes (Signed)
1409 Patient is refusing to wear heart monitor stating "My heart is fine. I don't need to wear that thing." MD notified.

## 2014-03-14 NOTE — Progress Notes (Signed)
1757 Inpatient diabetic coordinator on call paged.

## 2014-03-15 LAB — GLUCOSE, CAPILLARY
GLUCOSE-CAPILLARY: 254 mg/dL — AB (ref 70–99)
GLUCOSE-CAPILLARY: 232 mg/dL — AB (ref 70–99)
Glucose-Capillary: 268 mg/dL — ABNORMAL HIGH (ref 70–99)
Glucose-Capillary: 348 mg/dL — ABNORMAL HIGH (ref 70–99)
Glucose-Capillary: 367 mg/dL — ABNORMAL HIGH (ref 70–99)

## 2014-03-15 MED ORDER — METFORMIN HCL 500 MG PO TABS: 500.0000 mg | ORAL_TABLET | Freq: Two times a day (BID) | ORAL | Status: DC

## 2014-03-15 MED ORDER — INSULIN NPH (HUMAN) (ISOPHANE) 100 UNIT/ML ~~LOC~~ SUSP: 8.0000 [IU] | Freq: Two times a day (BID) | SUBCUTANEOUS | Status: DC

## 2014-03-15 MED ORDER — INSULIN SYRINGES (DISPOSABLE) U-100 1 ML MISC: Status: DC

## 2014-03-15 MED ORDER — BLOOD GLUCOSE MONITOR KIT: PACK | Status: DC

## 2014-03-15 NOTE — Progress Notes (Signed)
[id number] Patient unable to make it to the pharmacy this evening in time if d/c and is concerned about getting her diabetic supplies. MD notified and ok for patient to stay another night admitted.

## 2014-03-15 NOTE — Discharge Summary (Addendum)
Physician Discharge Summary  Megan Mullins:[id number] DOB: Aug 02, 1970 DOA: 03/13/2014  PCP: No PCP Per Patient  Admit date: 03/13/2014 Discharge date: 03/16/2014  Time spent: 45 minutes  Recommendations for Outpatient Follow-up:  -Will be discharged home today. -Advised to follow up with PCP in 2 weeks.   Discharge Diagnoses:  Active Problems:   Type 2 diabetes mellitus with hyperosmolar nonketotic hyperglycemia   Obesity   Tobacco abuse   Discharge Condition: Stable and improved  Filed Weights   03/13/14 1751 03/14/14 0400  Weight: 99.791 kg (220 lb) 101 kg (222 lb 10.6 oz)    History of present illness:  This is a 44 year old lady who presents because of polyuria, polydipsia, dry mouth for last 3 days. She denies any nausea, vomiting, diarrhea, fever. She denies any chest pain, dyspnea or palpitations. There is no cough. There is no weight loss. She does have a family history of diabetes. Evaluation in the emergency room showed her to be newly diagnosed diabetic. She is now being admitted for further management.  Hospital Course:   New Onset DM/HONK -Likely is a Type II and would hope to eventually be able to control with oral meds only. -For now will need insulin to get under better control. -Lantus is cost-prohibitive for her, so will order NPH 15 units BID. -She is advised to keep a CBG log and bring with her to her next appointment with her PCP. -Has been educated on diet, insulin administration and CBG checks.  Morbid Obesity  Tobacco Abuse -Counseled on cessation.   Procedures:  None   Consultations:  None  Discharge Instructions      Discharge Instructions    Ambulatory referral to Nutrition and Diabetic Education    Complete by:  As directed   New DM dx; new to insulin; no PCP or insurance Will have follow up at [facility]; appt set for 04/03/14 Will need to be seen in the [facility] office     Diet Carb  Modified    Complete by:  As directed      Increase activity slowly    Complete by:  As directed             Medication List    STOP taking these medications        amoxicillin 500 MG capsule  Commonly known as:  AMOXIL     oxyCODONE-acetaminophen 5-325 MG per tablet  Commonly known as:  PERCOCET      TAKE these medications        acetaminophen 500 MG tablet  Commonly known as:  TYLENOL  Take 500 mg by mouth every 6 (six) hours as needed for pain.     blood glucose meter kit and supplies Kit  To check CBG every day before breakfast and before dinner.     insulin NPH Human 100 UNIT/ML injection  Commonly known as:  HUMULIN N,NOVOLIN N  Inject 0.15 mLs (15 Units total) into the skin 2 (two) times daily before a meal.     Insulin Syringes (Disposable) U-100 1 ML Misc  Use BID with NPH     metFORMIN 500 MG tablet  Commonly known as:  GLUCOPHAGE  Take 1 tablet (500 mg total) by mouth 2 (two) times daily with a meal.       No Known Allergies Follow-up Information    Follow up with [facility] On 04/03/2014.   Specialty:  Occupational Therapy   Why:  at 9:00   Contact information:   [address] [place] [address] [phone]        The results of significant diagnostics from this hospitalization (including imaging, microbiology, ancillary and laboratory) are listed below for reference.    Significant Diagnostic Studies: Dg Chest 2 View  03/13/2014   CLINICAL DATA:  Dry mouth for 2 days. Hyperglycemia. Current smoker. Patient states she is diabetic.  EXAM: CHEST  2 VIEW  COMPARISON:  None.  FINDINGS: The heart size and mediastinal contours are within normal limits. Both lungs are clear. The visualized skeletal structures are unremarkable.  IMPRESSION: No active cardiopulmonary disease.   Electronically Signed   By: Lucienne Capers M.D.   On: 03/13/2014 20:13    Microbiology: Recent Results (from the past 240 hour(s))  MRSA PCR  Screening     Status: None   Collection Time: 03/13/14  9:28 PM  Result Value Ref Range Status   MRSA by PCR NEGATIVE NEGATIVE Final    Comment:        The GeneXpert MRSA Assay (FDA approved for NASAL specimens only), is one component of a comprehensive MRSA colonization surveillance program. It is not intended to diagnose MRSA infection nor to guide or monitor treatment for MRSA infections.      Labs: Basic Metabolic Panel:  Recent Labs Lab 03/14/14 0234 03/14/14 0518 03/14/14 0857 03/14/14 1311 03/14/14 1636  NA 137 135 136 134* 132*  K 3.2* 4.2 3.6 3.6 3.5  CL 108 107 108 103 108  CO2 $Re'26 23 23 22 21  'HEp$ GLUCOSE 193* 288* 309* 403* 309*  BUN $Re'10 11 10 12 13  'FpG$ CREATININE 0.99 1.05 1.03 1.01 0.96  CALCIUM 9.2 8.4 8.9 8.6 8.3*   Liver Function Tests:  Recent Labs Lab 03/13/14 1839  AST 41*  ALT 43*  ALKPHOS 88  BILITOT 1.0  PROT 8.2  ALBUMIN 4.5   No results for input(s): LIPASE, AMYLASE in the last 168 hours. No results for input(s): AMMONIA in the last 168 hours. CBC:  Recent Labs Lab 03/13/14 1839  WBC 6.1  HGB 13.5  HCT 40.8  MCV 96.7  PLT 280   Cardiac Enzymes: No results for input(s): CKTOTAL, CKMB, CKMBINDEX, TROPONINI in the last 168 hours. BNP: BNP (last 3 results) No results for input(s): BNP in the last 8760 hours.  ProBNP (last 3 results) No results for input(s): PROBNP in the last 8760 hours.  CBG:  Recent Labs Lab 03/15/14 2358 03/16/14 0119 03/16/14 0646 03/16/14 0743 03/16/14 1132  GLUCAP 367* 327* 275* 269* 291*       Signed:  Lelon Frohlich  [facility] Pager: [phone] 03/16/2014, 11:39 AM

## 2014-03-16 DIAGNOSIS — E1101 Type 2 diabetes mellitus with hyperosmolarity with coma: Secondary | ICD-10-CM | POA: Insufficient documentation

## 2014-03-16 LAB — GLUCOSE, CAPILLARY
GLUCOSE-CAPILLARY: 275 mg/dL — AB (ref 70–99)
Glucose-Capillary: >600 mg/dL (ref 70–99)
Glucose-Capillary: 327 mg/dL — ABNORMAL HIGH (ref 70–99)
Glucose-Capillary: 269 mg/dL — ABNORMAL HIGH (ref 70–99)
Glucose-Capillary: 291 mg/dL — ABNORMAL HIGH (ref 70–99)

## 2014-03-16 MED ORDER — INSULIN ASPART 100 UNIT/ML ~~LOC~~ SOLN
7.0000 [IU] | Freq: Once | SUBCUTANEOUS | Status: AC
Start: 2014-03-16 — End: 2014-03-16
  Administered 2014-03-16: 7 [IU] via SUBCUTANEOUS

## 2014-03-16 MED ORDER — INSULIN STARTER KIT- SYRINGES (ENGLISH)
1.0000 | Freq: Once | Status: AC
  Administered 2014-03-16: 1
  Filled 2014-03-16: qty 1

## 2014-03-16 MED ORDER — INSULIN NPH (HUMAN) (ISOPHANE) 100 UNIT/ML ~~LOC~~ SUSP: 15.0000 [IU] | Freq: Two times a day (BID) | SUBCUTANEOUS | Status: DC

## 2014-03-16 NOTE — Progress Notes (Signed)
Recheck of CBG = 327.  FYI to Dr

## 2014-03-16 NOTE — Progress Notes (Signed)
Another CBG check was 275 @0700 

## 2014-03-16 NOTE — Care Management Note (Signed)
    Page 1 of 1   03/16/2014     11:00:19 AM CARE MANAGEMENT NOTE 03/16/2014  Patient:  Megan Mullins,Megan Mullins   Account Number:  [id number]  Date Initiated:  03/16/2014  Documentation initiated by:  Sharrie RothmanBLACKWELL,Wong Steadham C  Subjective/Objective Assessment:   Pt admitted from home with DKA. Pt lives with family and will return home at discharge. Pt is independent with ADL's.     Action/Plan:   Pt has no insurance or PCP. [facility] per pts preference. Appt documented on AVS and pt made aware. Pt also made aware of ouptpt DM classes at [facility] the first Monday of each month.   Anticipated DC Date:  03/16/2014   Anticipated DC Plan:  HOME/SELF CARE  In-house referral  Financial Counselor      DC Planning Services  CM consult  MATCH Program      Choice offered to / List presented to:             Status of service:  Completed, signed off Medicare Important Message given?   (If response is "NO", the following Medicare IM given date fields will be blank) Date Medicare IM given:   Medicare IM given by:   Date Additional Medicare IM given:   Additional Medicare IM given by:    Discharge Disposition:  HOME/SELF CARE  Per UR Regulation:    If discussed at Long Length of Stay Meetings, dates discussed:    Comments:  03/16/14 1055 Arlyss Queenammy Regina Coppolino, RN BSN CM Referral made in [employer] for outpt DM education per [employer] direction. MATCH voucher given as well. Financial counselor made aware of self pay status.

## 2014-03-16 NOTE — Progress Notes (Signed)
Inpatient Diabetes Program Recommendations  AACE/ADA: New Consensus Statement on Inpatient Glycemic Control (2013)  Target Ranges:  Prepandial:   less than 140 mg/dL      Peak postprandial:   less than 180 mg/dL (1-2 hours)      Critically ill patients:  140 - 180 mg/dL   Results for Megan Mullins, Megan Mullins (MRN [id number]) as of 03/16/2014 10:53  Ref. Range 03/15/2014 07:56 03/15/2014 12:01 03/15/2014 17:12 03/15/2014 21:31 03/15/2014 23:58 03/16/2014 01:19 03/16/2014 06:46 03/16/2014 07:43  Glucose-Capillary Latest Range: 70-99 mg/dL 528268 (H) 413254 (H) 244232 (H) 348 (H) 367 (H) 327 (H) 275 (H) 269 (H)   Diabetes history: No; new onset Outpatient Diabetes medications: NA Current orders for Inpatient glycemic control: Lantus 12 units daily, Novolog 0-9 units TID with meals, Metformin 500 mg BID  Inpatient Diabetes Program Recommendations Insulin - Basal: Noted patient has no insurance and will discharge on insulin. Patient can purchase Novolin 70/30 at [facility] for $25 per vial. Please consider ordering 70/30 15 units BID with meals (breakfast and lunch).  Thanks, Orlando PennerMarie Nadie Fiumara, RN, MSN, CCRN, CDE Diabetes Coordinator Inpatient Diabetes Program [phone] (Team Pager) [phone] ([facility] office) [phone] [facility] office)

## 2014-03-16 NOTE — Progress Notes (Signed)
Patient received discharge instructions, scripts, and insulin starter kit and had no further questions.  Patient was able to demonstrate how to draw up 15 units of insulin.  Patient was able to teach back how and when to check blood sugar and how to administer insulin.  Patient about to teach back about signs of hyper and hypoglycemia.  Patient in stable condition at discharge.

## 2014-03-16 NOTE — Progress Notes (Signed)
  RD consulted for nutrition education regarding diabetes.   No results found for: HGBA1C  RD provided "Carbohydrate Counting for People with Diabetes" handout from the Academy of Nutrition and Dietetics. Discussed different food groups and their effects on blood sugar, emphasizing carbohydrate-containing foods. Provided list of carbohydrates and recommended serving sizes of common foods.  Discussed importance of controlled and consistent carbohydrate intake throughout the day. Provided examples of ways to balance meals/snacks and encouraged intake of high-fiber, whole grain complex carbohydrates. Teach back method used.  Expect good compliance.  Body mass index is 42.09 kg/(m^2). Pt meets criteria for obesity class III based on current BMI.  Current diet order is CHO modified, patient is consuming approximately 75% of meals at this time. Labs and medications reviewed. Pt is planning to attend Diabetes Ed [facility]  Royann ShiversLynn Gevin Perea MS,RD,CSG,LDN Office: [phone] Pager: [phone]

## 2014-03-16 NOTE — Progress Notes (Signed)
03/16/14 Spoke with pt regarding discharge plan.  Encouraged to check BS 4x/day before meals and at bedtime.  Pt has apptmt at the health department March 18th, but plans [facility] Monday March 7th. Lives with her mother who also has diabetes. Orlando PennerMarie Byrd, diabetes coordinator,  recommended 70/30 at discharge.  Discussed discharge insulin orders with MD.  Dr.Hernandez prefers to leave on NPH, but increased the dose.  Mellissa KohutSherrie Mehki Klumpp RD, CDE. M.Ed. Pager [phone] Inpatient Diabetes Coordinator

## 2014-03-16 NOTE — Progress Notes (Signed)
Pt's CBG was 367. Dr notified. Ordered 7 units Novalog and  recheck in about an hour.

## 2014-03-16 NOTE — Progress Notes (Signed)
UR chart review completed.  

## 2014-05-04 ENCOUNTER — Ambulatory Visit: Payer: Self-pay | Admitting: Nutrition

## 2014-05-07 ENCOUNTER — Ambulatory Visit: Payer: Self-pay | Admitting: Nutrition

## 2014-05-13 ENCOUNTER — Other Ambulatory Visit (HOSPITAL_COMMUNITY): Payer: Self-pay | Admitting: Family

## 2014-05-13 DIAGNOSIS — Z1231 Encounter for screening mammogram for malignant neoplasm of breast: Secondary | ICD-10-CM

## 2014-06-01 ENCOUNTER — Ambulatory Visit (HOSPITAL_COMMUNITY): Payer: Self-pay

## 2014-06-17 ENCOUNTER — Ambulatory Visit: Payer: Self-pay | Admitting: Nutrition

## 2014-09-20 ENCOUNTER — Emergency Department (HOSPITAL_COMMUNITY)
Admission: EM | Admit: 2014-09-20 | Discharge: 2014-09-20 | Disposition: A | Discharge status: Home / Self Care | Payer: Self-pay | Attending: Emergency Medicine | Admitting: Emergency Medicine

## 2014-09-20 ENCOUNTER — Encounter (HOSPITAL_COMMUNITY): Payer: Self-pay | Admitting: Cardiology

## 2014-09-20 DIAGNOSIS — E119 Type 2 diabetes mellitus without complications: Secondary | ICD-10-CM | POA: Insufficient documentation

## 2014-09-20 DIAGNOSIS — Z79899 Other long term (current) drug therapy: Secondary | ICD-10-CM | POA: Insufficient documentation

## 2014-09-20 DIAGNOSIS — H109 Unspecified conjunctivitis: Secondary | ICD-10-CM | POA: Insufficient documentation

## 2014-09-20 DIAGNOSIS — Z794 Long term (current) use of insulin: Secondary | ICD-10-CM | POA: Insufficient documentation

## 2014-09-20 DIAGNOSIS — Z720 Tobacco use: Secondary | ICD-10-CM | POA: Insufficient documentation

## 2014-09-20 HISTORY — DX: Type 2 diabetes mellitus without complications: E11.9

## 2014-09-20 MED ORDER — TOBRAMYCIN 0.3 % OP SOLN
2.0000 [drp] | Freq: Once | OPHTHALMIC | Status: AC
  Administered 2014-09-20: 2 [drp] via OPHTHALMIC
  Filled 2014-09-20: qty 10

## 2014-09-20 NOTE — ED Notes (Signed)
Bilateral eye draining and pain times 3 days .

## 2014-09-20 NOTE — Discharge Instructions (Signed)
Bacterial Conjunctivitis °Bacterial conjunctivitis (commonly called pink eye) is redness, soreness, or puffiness (inflammation) of the white part of your eye. It is caused by a germ called bacteria. These germs can easily spread from person to person (contagious). Your eye often will become red or pink. Your eye may also become irritated, watery, or have a thick discharge.  °HOME CARE  °· Apply a cool, clean washcloth over closed eyelids. Do this for 10-20 minutes, 3-4 times a day while you have pain. °· Gently wipe away any fluid coming from the eye with a warm, wet washcloth or cotton ball. °· Wash your hands often with soap and water. Use paper towels to dry your hands. °· Do not share towels or washcloths. °· Change or wash your pillowcase every day. °· Do not use eye makeup until the infection is gone. °· Do not use machines or drive if your vision is blurry. °· Stop using contact lenses. Do not use them again until your doctor says it is okay. °· Do not touch the tip of the eye drop bottle or medicine tube with your fingers when you put medicine on the eye. °GET HELP RIGHT AWAY IF:  °· Your eye is not better after 3 days of starting your medicine. °· You have a yellowish fluid coming out of the eye. °· You have more pain in the eye. °· Your eye redness is spreading. °· Your vision becomes blurry. °· You have a fever or lasting symptoms for more than 2-3 days. °· You have a fever and your symptoms suddenly get worse. °· You have pain in the face. °· Your face gets red or puffy (swollen). °MAKE SURE YOU:  °· Understand these instructions. °· Will watch this condition. °· Will get help right away if you are not doing well or get worse. °Document Released: 10/12/2007 Document Revised: 12/20/2011 Document Reviewed: 09/08/2011 °ExitCare® Patient Information ©2015 [employer]. This information is not intended to replace advice given to you by your health care provider. Make sure you discuss any questions you have  with your health care provider. ° °

## 2014-09-21 NOTE — ED Provider Notes (Signed)
CSN: [id number]     Arrival date & time 09/20/14  1020 History   First MD Initiated Contact with Patient 09/20/14 1035     Chief Complaint  Patient presents with  . Eye Problem     (Consider location/radiation/quality/duration/timing/severity/associated sxs/prior Treatment) HPI   Megan Mullins is a 44 y.o. female who presents to the Emergency Department complaining of redness, itching and tearing of both her eyes for 3 days.  She states that she wakes up each morning with her eyes crusted closed. She has tried OTC eye drops without relief.  She denies fever, sore throat, facial pain, headaches and dizziness.  Tearing of her eyes is worse with bright light.  Nothing makes her symptoms better   Past Medical History  Diagnosis Date  . Diabetes mellitus without complication    Past Surgical History  Procedure Laterality Date  . Tubal ligation     History reviewed. No pertinent family history. Social History  Substance Use Topics  . Smoking status: Current Every Day Smoker -- 1.00 packs/day    Types: Cigarettes  . Smokeless tobacco: None  . Alcohol Use: Yes     Comment: occasionally   OB History    No data available     Review of Systems  Constitutional: Negative for fever, chills, activity change and appetite change.  HENT: Negative for congestion, ear pain and facial swelling.   Eyes: Positive for photophobia, pain, discharge, redness, itching and visual disturbance.  Respiratory: Negative for shortness of breath.   Cardiovascular: Negative for chest pain.  Gastrointestinal: Negative for nausea and vomiting.  Skin: Negative for rash.  Neurological: Negative for dizziness, weakness and numbness.  All other systems reviewed and are negative.     Allergies  Review of patient's allergies indicates no known allergies.  Home Medications   Prior to Admission medications   Medication Sig Start Date End Date Taking? Authorizing Provider  insulin NPH Human (HUMULIN  N,NOVOLIN N) 100 UNIT/ML injection Inject 0.15 mLs (15 Units total) into the skin 2 (two) times daily before a meal. 03/16/14  Yes Estela Leonie Green, MD  metFORMIN (GLUCOPHAGE) 1000 MG tablet Take 1,000 mg by mouth 2 (two) times daily with a meal.   Yes Historical Provider, MD  blood glucose meter kit and supplies KIT To check CBG every day before breakfast and before dinner. 03/15/14   Erline Hau, MD  Insulin Syringes, Disposable, U-100 1 ML MISC Use BID with NPH 03/15/14   Erline Hau, MD  metFORMIN (GLUCOPHAGE) 500 MG tablet Take 1 tablet (500 mg total) by mouth 2 (two) times daily with a meal. 03/15/14   Erline Hau, MD   BP 130/88 mmHg  Pulse 90  Temp(Src) 97.8 F (36.6 C) (Oral)  Resp 18  Ht 5' 1"  (1.549 m)  Wt 215 lb (97.523 kg)  BMI 40.64 kg/m2  SpO2 98%  LMP 08/20/2014 Physical Exam  Constitutional: She is oriented to person, place, and time. She appears well-developed and well-nourished. No distress.  HENT:  Head: Normocephalic and atraumatic.  Eyes: EOM are normal. Pupils are equal, round, and reactive to light. Lids are everted and swept, no foreign bodies found. Right eye exhibits discharge. Right eye exhibits no chemosis. No foreign body present in the right eye. Left eye exhibits discharge. Left eye exhibits no chemosis. No foreign body present in the left eye. Right conjunctiva is injected. Left conjunctiva is injected.  Neck: Normal range of motion. Neck supple.  No thyromegaly present.  Cardiovascular: Normal rate, regular rhythm, normal heart sounds and intact distal pulses.   No murmur heard. Pulmonary/Chest: Effort normal and breath sounds normal. No respiratory distress.  Musculoskeletal: Normal range of motion.  Lymphadenopathy:    She has no cervical adenopathy.  Neurological: She is alert and oriented to person, place, and time. She exhibits normal muscle tone. Coordination normal.  Skin: Skin is warm and dry. No rash  noted.  Nursing note and vitals reviewed.   ED Course  Procedures (including critical care time) Labs Review Labs Reviewed - No data to display  I  EKG Interpretation None         Visual Acuity  Right Eye Distance: 20/25 Left Eye Distance: 20/20 Bilateral Distance: 20/20  Right Eye Near:   Left Eye Near:    Bilateral Near:      MDM   Final diagnoses:  Bilateral conjunctivitis    Pt is well appearing, non-toxic.  Green discharge at the bilateral medial eye and crusting to eyelids.  Appears c/w bacterial conjunctivitis.  Tobramycin ophtho drops dispensed.  Pt agrees to warm compresses and f/u with Dr. Iona Hansen if needed.      Kem Parkinson, PA-C 09/21/14 2009  Forde Dandy, MD 09/22/14 1134

## 2015-07-08 ENCOUNTER — Encounter (HOSPITAL_COMMUNITY): Payer: Self-pay | Admitting: *Deleted

## 2015-07-08 ENCOUNTER — Observation Stay (HOSPITAL_COMMUNITY)
Admission: EM | Admit: 2015-07-08 | Discharge: 2015-07-10 | Disposition: A | Discharge status: Home / Self Care | Payer: Self-pay | Attending: Internal Medicine | Admitting: Internal Medicine

## 2015-07-08 DIAGNOSIS — E1165 Type 2 diabetes mellitus with hyperglycemia: Principal | ICD-10-CM | POA: Insufficient documentation

## 2015-07-08 DIAGNOSIS — E1101 Type 2 diabetes mellitus with hyperosmolarity with coma: Secondary | ICD-10-CM | POA: Diagnosis present

## 2015-07-08 DIAGNOSIS — E1100 Type 2 diabetes mellitus with hyperosmolarity without nonketotic hyperglycemic-hyperosmolar coma (NKHHC): Secondary | ICD-10-CM | POA: Diagnosis present

## 2015-07-08 DIAGNOSIS — E871 Hypo-osmolality and hyponatremia: Secondary | ICD-10-CM | POA: Diagnosis present

## 2015-07-08 DIAGNOSIS — Z91199 Patient's noncompliance with other medical treatment and regimen due to unspecified reason: Secondary | ICD-10-CM

## 2015-07-08 DIAGNOSIS — Z720 Tobacco use: Secondary | ICD-10-CM | POA: Diagnosis present

## 2015-07-08 DIAGNOSIS — E669 Obesity, unspecified: Secondary | ICD-10-CM | POA: Diagnosis present

## 2015-07-08 DIAGNOSIS — E870 Hyperosmolality and hypernatremia: Secondary | ICD-10-CM | POA: Diagnosis present

## 2015-07-08 DIAGNOSIS — F17210 Nicotine dependence, cigarettes, uncomplicated: Secondary | ICD-10-CM | POA: Insufficient documentation

## 2015-07-08 DIAGNOSIS — Z9119 Patient's noncompliance with other medical treatment and regimen: Secondary | ICD-10-CM

## 2015-07-08 LAB — BLOOD GAS, VENOUS
Acid-base deficit: 5.2 mmol/L — ABNORMAL HIGH (ref 0.0–2.0)
BICARBONATE: 20 meq/L (ref 20.0–24.0)
O2 SAT: 87.8 %
PATIENT TEMPERATURE: 37
PO2 VEN: 58.6 mmHg — AB (ref 31.0–45.0)
TCO2: 16.7 mmol/L (ref 0–100)
pCO2, Ven: 38.2 mmHg — ABNORMAL LOW (ref 45.0–50.0)
pH, Ven: 7.332 — ABNORMAL HIGH (ref 7.250–7.300)

## 2015-07-08 LAB — CBG MONITORING, ED
Glucose-Capillary: >600 mg/dL (ref 65–99)
Glucose-Capillary: >600 mg/dL (ref 65–99)

## 2015-07-08 LAB — CBC WITH DIFFERENTIAL/PLATELET
BASOS ABS: 0 10*3/uL (ref 0.0–0.1)
Basophils Relative: 0 %
Eosinophils Absolute: 0.1 10*3/uL (ref 0.0–0.7)
Eosinophils Relative: 1 %
HEMATOCRIT: 41.6 % (ref 36.0–46.0)
Hemoglobin: 14.4 g/dL (ref 12.0–15.0)
LYMPHS PCT: 29 %
Lymphs Abs: 2.3 10*3/uL (ref 0.7–4.0)
MCH: 31.9 pg (ref 26.0–34.0)
MCHC: 34.6 g/dL (ref 30.0–36.0)
MCV: 92.2 fL (ref 78.0–100.0)
MONO ABS: 0.6 10*3/uL (ref 0.1–1.0)
Monocytes Relative: 8 %
NEUTROS ABS: 4.9 10*3/uL (ref 1.7–7.7)
NEUTROS PCT: 62 %
Platelets: 305 10*3/uL (ref 150–400)
RBC: 4.51 MIL/uL (ref 3.87–5.11)
RDW: 12.7 % (ref 11.5–15.5)
WBC: 7.9 10*3/uL (ref 4.0–10.5)

## 2015-07-08 LAB — COMPREHENSIVE METABOLIC PANEL
ALBUMIN: 4.5 g/dL (ref 3.5–5.0)
ALT: 39 U/L (ref 14–54)
AST: 36 U/L (ref 15–41)
Alkaline Phosphatase: 79 U/L (ref 38–126)
Anion gap: 19 — ABNORMAL HIGH (ref 5–15)
BUN: 18 mg/dL (ref 6–20)
CHLORIDE: 86 mmol/L — AB (ref 101–111)
CO2: 19 mmol/L — AB (ref 22–32)
Calcium: 10 mg/dL (ref 8.9–10.3)
Creatinine, Ser: 1.55 mg/dL — ABNORMAL HIGH (ref 0.44–1.00)
GFR calc Af Amer: 47 mL/min — ABNORMAL LOW (ref >60)
GFR calc non Af Amer: 40 mL/min — ABNORMAL LOW (ref >60)
Glucose, Bld: 782 mg/dL (ref 65–99)
Potassium: 3.8 mmol/L (ref 3.5–5.1)
SODIUM: 124 mmol/L — AB (ref 135–145)
Total Bilirubin: 1.1 mg/dL (ref 0.3–1.2)
Total Protein: 8.2 g/dL — ABNORMAL HIGH (ref 6.5–8.1)

## 2015-07-08 MED ORDER — SODIUM CHLORIDE 0.9 % IV SOLN
INTRAVENOUS | Status: DC
  Administered 2015-07-08: 5.4 [IU]/h via INTRAVENOUS
  Filled 2015-07-08: qty 2.5

## 2015-07-08 MED ORDER — SODIUM CHLORIDE 0.9 % IV SOLN
Freq: Once | INTRAVENOUS | Status: AC
  Administered 2015-07-08: 1000 mL via INTRAVENOUS

## 2015-07-08 MED ORDER — SODIUM CHLORIDE 0.9 % IV SOLN
INTRAVENOUS | Status: DC
  Administered 2015-07-09: 03:00:00 via INTRAVENOUS

## 2015-07-08 MED ORDER — SODIUM CHLORIDE 0.9 % IV BOLUS (SEPSIS)
1000.0000 mL | Freq: Once | INTRAVENOUS | Status: AC
  Administered 2015-07-08: 1000 mL via INTRAVENOUS

## 2015-07-08 MED ORDER — SODIUM CHLORIDE 0.9 % IV SOLN
INTRAVENOUS | Status: AC
  Filled 2015-07-08: qty 2.5

## 2015-07-08 MED ORDER — SODIUM CHLORIDE 0.9 % IV BOLUS (SEPSIS)
1000.0000 mL | Freq: Once | INTRAVENOUS | Status: AC
  Administered 2015-07-09: 1000 mL via INTRAVENOUS

## 2015-07-08 NOTE — ED Notes (Signed)
Pt reports having a CBG of >600 just PTA. Pt reports polyuria and polydipsia.

## 2015-07-08 NOTE — ED Provider Notes (Signed)
CSN: [id number]     Arrival date & time 07/08/15  2144 History  By signing my name below, I, Ephriam Jenkins, attest that this documentation has been prepared under the direction and in the presence of Dorie Rank, MD. Electronically signed, Ephriam Jenkins, ED Scribe. 07/08/2015. 10:32 PM.    Chief Complaint  Patient presents with  . Hyperglycemia   The history is provided by the patient. No language interpreter was used.   HPI Comments: Megan Mullins is a 45 y.o. female with a PMHx of DM, who presents to the Emergency Department complaining of high blood sugar onset less than one hour ago. Pt states she checked her blood sugar PTA and was >600 prior to arrival. Pt also reports polyuria and states she has been drinking a lot of water today. Pt states she does not take insulin for her diabetes but she takes a pill occasionally. Pt denies any fever or cough, vomiting, diarrhea.  Past Medical History  Diagnosis Date  . Diabetes mellitus without complication [facility])    Past Surgical History  Procedure Laterality Date  . Tubal ligation     History reviewed. No pertinent family history. Social History  Substance Use Topics  . Smoking status: Current Every Day Smoker -- 0.50 packs/day    Types: Cigarettes  . Smokeless tobacco: None  . Alcohol Use: Yes     Comment: occasionally   OB History    No data available     Review of Systems  Constitutional: Negative for fever.  Respiratory: Negative for cough.   Gastrointestinal: Negative for vomiting and diarrhea.  Endocrine: Positive for polyuria.  All other systems reviewed and are negative.     Allergies  Review of patient's allergies indicates no known allergies.  Home Medications   Prior to Admission medications   Medication Sig Start Date End Date Taking? Authorizing Provider  blood glucose meter kit and supplies KIT To check CBG every day before breakfast and before dinner. 03/15/14   Erline Hau, MD  insulin NPH Human  (HUMULIN N,NOVOLIN N) 100 UNIT/ML injection Inject 0.15 mLs (15 Units total) into the skin 2 (two) times daily before a meal. 03/16/14   Erline Hau, MD  Insulin Syringes, Disposable, U-100 1 ML MISC Use BID with NPH 03/15/14   Erline Hau, MD  metFORMIN (GLUCOPHAGE) 1000 MG tablet Take 1,000 mg by mouth 2 (two) times daily with a meal.    Historical Provider, MD  metFORMIN (GLUCOPHAGE) 500 MG tablet Take 1 tablet (500 mg total) by mouth 2 (two) times daily with a meal. 03/15/14   Erline Hau, MD   BP 126/98 mmHg  Pulse 129  Temp(Src) 96.4 F (35.8 C) (Temporal)  Resp 20  Ht '5\' 1"'$  (1.549 m)  Wt 240 lb (108.863 kg)  BMI 45.37 kg/m2  SpO2 97%  LMP 06/23/2015 Physical Exam  Constitutional: She appears well-developed and well-nourished. No distress.  HENT:  Head: Normocephalic and atraumatic.  Right Ear: External ear normal.  Left Ear: External ear normal.  Eyes: Conjunctivae are normal. Right eye exhibits no discharge. Left eye exhibits no discharge. No scleral icterus.  Neck: Neck supple. No tracheal deviation present.  Cardiovascular: Normal rate, regular rhythm and intact distal pulses.   Pulmonary/Chest: Effort normal and breath sounds normal. No stridor. No respiratory distress. She has no wheezes. She has no rales.  Abdominal: Soft. Bowel sounds are normal. She exhibits no distension. There is no tenderness. There is  no rebound and no guarding.  Musculoskeletal: She exhibits no edema or tenderness.  Neurological: She is alert. She has normal strength. No cranial nerve deficit (no facial droop, extraocular movements intact, no slurred speech) or sensory deficit. She exhibits normal muscle tone. She displays no seizure activity. Coordination normal.  Skin: Skin is warm and dry. No rash noted.  Psychiatric: She has a normal mood and affect.  Nursing note and vitals reviewed.   ED Course  Procedures  10:21 PM-Will order blood work. Discussed  treatment plan with pt at bedside and pt agreed to plan.   Medications given in the ED Medications  insulin regular (NOVOLIN R,HUMULIN R) 250 Units in sodium chloride 0.9 % 250 mL (1 Units/mL) infusion (5.4 Units/hr Intravenous New Bag/Given 07/08/15 2257)  sodium chloride 0.9 % bolus 1,000 mL (not administered)    And  sodium chloride 0.9 % bolus 1,000 mL (1,000 mLs Intravenous New Bag/Given 07/08/15 2230)    And  0.9 %  sodium chloride infusion (not administered)  0.9 %  sodium chloride infusion (1,000 mLs Intravenous New Bag/Given 07/08/15 2227)     Labs Review Labs Reviewed  CBG MONITORING, ED - Abnormal; Notable for the following:    Glucose-Capillary >600 (*)    All other components within normal limits  CBC WITH DIFFERENTIAL/PLATELET  COMPREHENSIVE METABOLIC PANEL     MDM   Final diagnoses:  Type 2 diabetes mellitus with hyperosmolar nonketotic hyperglycemia ([place])   The patient has profound hyperglycemia. Patient unfortunately has not been taking her medications.  Venous blood gas is not acidotic. Marland Kitchen No evidence of DKA.  Pt has been given IV fluids and started on an insulin infusion.  Will admit for further treatment.  I personally performed the services described in this documentation, which was scribed in my presence.  The recorded information has been reviewed and is accurate.     Dorie Rank, MD 07/08/15 2350

## 2015-07-08 NOTE — ED Notes (Signed)
CRITICAL VALUE ALERT  Critical value received:  Blood Glucose 782  Date of notification:  07/08/2015 Time of notification:  2238  Critical value read back:Yes.    Nurse who received alert:  BKN  MD notified (1st page):  Lynelle DoctorKnapp  Time of first page:  2238  MD notified (2nd page):  Time of second page:  Responding MD:    Time MD responded:

## 2015-07-08 NOTE — H&P (Signed)
History and Physical    Megan Mullins VZD:[id number] DOB: 1970-11-12 DOA: 07/08/2015  Referring MD/NP/PA: Ephriam Jenkins PCP: No PCP Per Patient  Outpatient Specialists:  Patient coming from: home  Chief Complaint: hyperglycemia  HPI: Megan Mullins is a 45 y.o. female with medical history significant of tobacco abuse, diabetes type 2 who presents to the ED with complaints of hyperglycemia onset less than an hour prior to her arrival in the ED. She reports that when she checked her blood sugar prior to admission, her glucose level was greater than 600. She reports associated polyuria and polydipsia. She admits to poor diabetes management. She was newly diagnosed with diabetes in February 2017, and was started on insulin (15 units NPH in the a.m. and 15 units in the p.m.) upon discharge. She did not take her insulin, but did take her PO medication until she ran out of pills. She has not taken any medication since. She does not regularly check her blood glucose either. She denies any fever, cough, vomiting, or diarrhea. She reports cigarette use, but denies any illicit drug use. She drinks occasionally, but is not an every day drinker.  She believes in God now, and feels God will take away her diabetes.   ED Course: While in the ED, patient was found to be hyperglycemic. She was started on IV fluids and insulin drip. ABG showed a pH of 7.332 and pCO2 of 38.2 Hospitalist was asked to admit for treatment of hyperosmolar non-ketotic hyperglycemia.  Review of Systems: As per HPI otherwise 10 point review of systems negative.   Past Medical History  Diagnosis Date  . Diabetes mellitus without complication [facility])     Past Surgical History  Procedure Laterality Date  . Tubal ligation       reports that she has been smoking Cigarettes.  She has been smoking about 0.50 packs per day. She does not have any smokeless tobacco history on file. She reports that she drinks alcohol. She reports that she does not  use illicit drugs.  No Known Allergies  History reviewed. No pertinent family history.  Prior to Admission medications   Medication Sig Start Date End Date Taking? Authorizing Provider  blood glucose meter kit and supplies KIT To check CBG every day before breakfast and before dinner. 03/15/14   Erline Hau, MD  insulin NPH Human (HUMULIN N,NOVOLIN N) 100 UNIT/ML injection Inject 0.15 mLs (15 Units total) into the skin 2 (two) times daily before a meal. 03/16/14   Erline Hau, MD  Insulin Syringes, Disposable, U-100 1 ML MISC Use BID with NPH 03/15/14   Erline Hau, MD  metFORMIN (GLUCOPHAGE) 1000 MG tablet Take 1,000 mg by mouth 2 (two) times daily with a meal.    Historical Provider, MD  metFORMIN (GLUCOPHAGE) 500 MG tablet Take 1 tablet (500 mg total) by mouth 2 (two) times daily with a meal. 03/15/14   Erline Hau, MD    Physical Exam: Filed Vitals:   07/08/15 2149 07/08/15 2312  BP: 126/98 122/84  Pulse: 129 107  Temp: 96.4 F (35.8 C)   TempSrc: Temporal   Resp: 20 15  Height: _0  (1.549 m)   Weight: 108.863 kg (240 lb)   SpO2: 97% 96%   Constitutional: NAD, calm, comfortable Filed Vitals:   07/08/15 2149 07/08/15 2312  BP: 126/98 122/84  Pulse: 129 107  Temp: 96.4 F (35.8 C)   TempSrc: Temporal   Resp: 20 15  Height: _0  (1.549 m)   Weight: 108.863 kg (240 lb)   SpO2: 97% 96%   Eyes: PERRL, lids and conjunctivae normal  ENMT: Mucous membranes are moist. Posterior pharynx clear of any exudate or lesions.Normal dentition. Neck: normal, supple, no masses, no thyromegaly Respiratory: clear to auscultation bilaterally, no wheezing, no crackles. Normal respiratory effort. No accessory muscle use.  Cardiovascular: Regular rate and rhythm, no murmurs / rubs / gallops. No extremity edema. 2+ pedal pulses. No carotid bruits.  Abdomen: no tenderness, no masses palpated. No hepatosplenomegaly. Bowel sounds positive.    Musculoskeletal: no clubbing / cyanosis. No joint deformity upper and lower extremities. Good ROM, no contractures. Normal muscle tone.  Skin: no rashes, lesions, ulcers. No induration Neurologic: CN 2-12 grossly intact. Sensation intact, DTR normal. Strength 5/5 in all 4.  Psychiatric: Normal judgment and insight. Alert and oriented x 3. Normal mood.   Labs on Admission: I have personally reviewed following labs and imaging studies  CBC:  Recent Labs Lab 07/08/15 2207  WBC 7.9  NEUTROABS 4.9  HGB 14.4  HCT 41.6  MCV 92.2  PLT 401   Basic Metabolic Panel:  Recent Labs Lab 07/08/15 2207  NA 124*  K 3.8  CL 86*  CO2 19*  GLUCOSE 782*  BUN 18  CREATININE 1.55*  CALCIUM 10.0   Liver Function Tests:  Recent Labs Lab 07/08/15 2207  AST 36  ALT 39  ALKPHOS 79  BILITOT 1.1  PROT 8.2*  ALBUMIN 4.5   CBG:  Recent Labs Lab 07/08/15 2153 07/08/15 2254  GLUCAP >600* >600*   Urine analysis:    Component Value Date/Time   COLORURINE STRAW* 03/13/2014 1940   APPEARANCEUR CLEAR 03/13/2014 1940   LABSPEC <1.005* 03/13/2014 1940   PHURINE 5.5 03/13/2014 1940   GLUCOSEU 250* 03/13/2014 1940   HGBUR LARGE* 03/13/2014 1940   BILIRUBINUR NEGATIVE 03/13/2014 1940   KETONESUR 15* 03/13/2014 1940   PROTEINUR NEGATIVE 03/13/2014 1940   UROBILINOGEN 0.2 03/13/2014 1940   NITRITE NEGATIVE 03/13/2014 1940   LEUKOCYTESUR NEGATIVE 03/13/2014 1940   EKG: Independently reviewed.   Assessment/Plan Principal Problem:   Hyperglycemic hyperosmolar nonketotic coma ([place]) Active Problems:   Type 2 diabetes mellitus with hyperosmolar nonketotic hyperglycemia ([facility])   Obesity   Tobacco abuse   Noncompliance  1. Hyperosmolar nonketotic hyperglycemia. CBG >600. Serum glucose 782.  ABG showed pH 7.332 and pCO2 of 38.2.  Will continue IV fluids, SSI, and Novolog IV. Will monitor.  She doesn't require insulin drip.  Will admit her to medical floor.  2. Diabetes type 2 with  hyperosmolar non-ketotic hyperglycemia. Will start the patient on sliding scale insulin and novologwith resistant scale.  Follow up A1c.  3. Hyponatremia. Anticipate improvement with IVFs and correction of blood glucose.  4. Tobacco abuse. Counseled on the importance of cessation. Nicotine patch PRN. 5. Medical non-compliance. 6. Obesity.  DVT prophylaxis: Lovenox Code Status: Full  Family Communication: Discussed with patient. No family present at bedside. Disposition Plan: admit to telemetry. Discharge home once improved, likely tomorrow  Consults called: none Admission status: Admit as observation   Orvan Falconer, MD FACP [facility]   If 7PM-7AM, please contact night-coverage [email or web address] Password [facility]  07/08/2015, 11:31 PM   By signing my name below, I, Delene Ruffini, attest that this documentation has been prepared under the direction and in the presence of Kathie Dike, MD. Electronically Signed: Delene Ruffini 07/08/2015 11:30pm

## 2015-07-09 ENCOUNTER — Encounter (HOSPITAL_COMMUNITY): Payer: Self-pay | Admitting: Internal Medicine

## 2015-07-09 DIAGNOSIS — Z9119 Patient's noncompliance with other medical treatment and regimen: Secondary | ICD-10-CM

## 2015-07-09 DIAGNOSIS — E870 Hyperosmolality and hypernatremia: Secondary | ICD-10-CM | POA: Diagnosis present

## 2015-07-09 DIAGNOSIS — Z91199 Patient's noncompliance with other medical treatment and regimen due to unspecified reason: Secondary | ICD-10-CM

## 2015-07-09 LAB — COMPREHENSIVE METABOLIC PANEL
ALK PHOS: 70 U/L (ref 38–126)
ALT: 33 U/L (ref 14–54)
ANION GAP: 12 (ref 5–15)
AST: 27 U/L (ref 15–41)
Albumin: 3.8 g/dL (ref 3.5–5.0)
BILIRUBIN TOTAL: 0.7 mg/dL (ref 0.3–1.2)
BUN: 13 mg/dL (ref 6–20)
CALCIUM: 9.1 mg/dL (ref 8.9–10.3)
CO2: 19 mmol/L — ABNORMAL LOW (ref 22–32)
CREATININE: 1.04 mg/dL — AB (ref 0.44–1.00)
Chloride: 101 mmol/L (ref 101–111)
GFR calc non Af Amer: >60 mL/min (ref >60)
GLUCOSE: 288 mg/dL — AB (ref 65–99)
Potassium: 3.4 mmol/L — ABNORMAL LOW (ref 3.5–5.1)
Sodium: 132 mmol/L — ABNORMAL LOW (ref 135–145)
TOTAL PROTEIN: 6.8 g/dL (ref 6.5–8.1)

## 2015-07-09 LAB — CBC
HCT: 36.4 % (ref 36.0–46.0)
HEMOGLOBIN: 12.9 g/dL (ref 12.0–15.0)
MCH: 32.2 pg (ref 26.0–34.0)
MCHC: 35.4 g/dL (ref 30.0–36.0)
MCV: 90.8 fL (ref 78.0–100.0)
PLATELETS: 273 10*3/uL (ref 150–400)
RBC: 4.01 MIL/uL (ref 3.87–5.11)
RDW: 12.5 % (ref 11.5–15.5)
WBC: 6.9 10*3/uL (ref 4.0–10.5)

## 2015-07-09 LAB — GLUCOSE, CAPILLARY
GLUCOSE-CAPILLARY: 319 mg/dL — AB (ref 65–99)
GLUCOSE-CAPILLARY: 288 mg/dL — AB (ref 65–99)
GLUCOSE-CAPILLARY: 373 mg/dL — AB (ref 65–99)
Glucose-Capillary: 196 mg/dL — ABNORMAL HIGH (ref 65–99)
Glucose-Capillary: 348 mg/dL — ABNORMAL HIGH (ref 65–99)

## 2015-07-09 LAB — BASIC METABOLIC PANEL
Anion gap: 7 (ref 5–15)
Anion gap: 6 (ref 5–15)
Anion gap: 7 (ref 5–15)
BUN: 10 mg/dL (ref 6–20)
BUN: 10 mg/dL (ref 6–20)
BUN: 10 mg/dL (ref 6–20)
CHLORIDE: 107 mmol/L (ref 101–111)
CHLORIDE: 106 mmol/L (ref 101–111)
CHLORIDE: 105 mmol/L (ref 101–111)
CO2: 21 mmol/L — AB (ref 22–32)
CO2: 22 mmol/L (ref 22–32)
CO2: 22 mmol/L (ref 22–32)
CREATININE: 0.81 mg/dL (ref 0.44–1.00)
CREATININE: 0.87 mg/dL (ref 0.44–1.00)
CREATININE: 0.83 mg/dL (ref 0.44–1.00)
Calcium: 8.2 mg/dL — ABNORMAL LOW (ref 8.9–10.3)
Calcium: 8.2 mg/dL — ABNORMAL LOW (ref 8.9–10.3)
Calcium: 8.6 mg/dL — ABNORMAL LOW (ref 8.9–10.3)
GFR calc Af Amer: >60 mL/min (ref >60)
GFR calc Af Amer: >60 mL/min (ref >60)
GFR calc Af Amer: >60 mL/min (ref >60)
GFR calc non Af Amer: >60 mL/min (ref >60)
GFR calc non Af Amer: >60 mL/min (ref >60)
GFR calc non Af Amer: >60 mL/min (ref >60)
GLUCOSE: 245 mg/dL — AB (ref 65–99)
Glucose, Bld: 221 mg/dL — ABNORMAL HIGH (ref 65–99)
Glucose, Bld: 381 mg/dL — ABNORMAL HIGH (ref 65–99)
Potassium: 3.4 mmol/L — ABNORMAL LOW (ref 3.5–5.1)
Potassium: 3.6 mmol/L (ref 3.5–5.1)
Potassium: 4.1 mmol/L (ref 3.5–5.1)
SODIUM: 135 mmol/L (ref 135–145)
Sodium: 134 mmol/L — ABNORMAL LOW (ref 135–145)
Sodium: 134 mmol/L — ABNORMAL LOW (ref 135–145)

## 2015-07-09 LAB — CBG MONITORING, ED: GLUCOSE-CAPILLARY: 565 mg/dL — AB (ref 65–99)

## 2015-07-09 MED ORDER — SODIUM CHLORIDE 0.9% FLUSH
3.0000 mL | Freq: Two times a day (BID) | INTRAVENOUS | Status: DC
  Administered 2015-07-09 – 2015-07-10 (×3): 3 mL via INTRAVENOUS

## 2015-07-09 MED ORDER — PNEUMOCOCCAL VAC POLYVALENT 25 MCG/0.5ML IJ INJ
0.5000 mL | INJECTION | INTRAMUSCULAR | Status: AC
  Administered 2015-07-10: 0.5 mL via INTRAMUSCULAR
  Filled 2015-07-09: qty 0.5

## 2015-07-09 MED ORDER — BLOOD GLUCOSE MONITOR KIT: PACK | Status: DC

## 2015-07-09 MED ORDER — INSULIN NPH (HUMAN) (ISOPHANE) 100 UNIT/ML ~~LOC~~ SUSP
15.0000 [IU] | Freq: Two times a day (BID) | SUBCUTANEOUS | Status: DC
  Administered 2015-07-09 – 2015-07-10 (×3): 15 [IU] via SUBCUTANEOUS
  Filled 2015-07-09 (×2): qty 10

## 2015-07-09 MED ORDER — INSULIN ASPART 100 UNIT/ML ~~LOC~~ SOLN
5.0000 [IU] | Freq: Once | SUBCUTANEOUS | Status: AC
  Administered 2015-07-09: 5 [IU] via SUBCUTANEOUS

## 2015-07-09 MED ORDER — SODIUM CHLORIDE 0.9 % IV SOLN
Freq: Once | INTRAVENOUS | Status: AC
  Administered 2015-07-09: 01:00:00 via INTRAVENOUS

## 2015-07-09 MED ORDER — POTASSIUM CHLORIDE CRYS ER 20 MEQ PO TBCR
40.0000 meq | EXTENDED_RELEASE_TABLET | ORAL | Status: AC
  Administered 2015-07-09 (×2): 40 meq via ORAL
  Filled 2015-07-09 (×2): qty 2

## 2015-07-09 MED ORDER — INSULIN ASPART 100 UNIT/ML ~~LOC~~ SOLN
0.0000 [IU] | Freq: Three times a day (TID) | SUBCUTANEOUS | Status: DC
  Administered 2015-07-09: 8 [IU] via SUBCUTANEOUS
  Administered 2015-07-09 – 2015-07-10 (×2): 11 [IU] via SUBCUTANEOUS
  Administered 2015-07-10: 8 [IU] via SUBCUTANEOUS

## 2015-07-09 MED ORDER — METFORMIN HCL 1000 MG PO TABS: 1000.0000 mg | ORAL_TABLET | Freq: Two times a day (BID) | ORAL | Status: DC

## 2015-07-09 MED ORDER — INSULIN ASPART 100 UNIT/ML ~~LOC~~ SOLN: 0.0000 [IU] | Freq: Three times a day (TID) | SUBCUTANEOUS | Status: DC

## 2015-07-09 MED ORDER — LIVING WELL WITH DIABETES BOOK
Freq: Once | Status: AC
  Administered 2015-07-09: 1
  Filled 2015-07-09: qty 1

## 2015-07-09 MED ORDER — INSULIN ASPART 100 UNIT/ML ~~LOC~~ SOLN
0.0000 [IU] | Freq: Every day | SUBCUTANEOUS | Status: DC
  Administered 2015-07-09: 5 [IU] via SUBCUTANEOUS

## 2015-07-09 MED ORDER — SODIUM CHLORIDE 0.9% FLUSH: 3.0000 mL | INTRAVENOUS | Status: DC | PRN

## 2015-07-09 MED ORDER — INSULIN SYRINGES (DISPOSABLE) U-100 1 ML MISC: Status: DC

## 2015-07-09 MED ORDER — INSULIN ASPART 100 UNIT/ML ~~LOC~~ SOLN
10.0000 [IU] | Freq: Once | SUBCUTANEOUS | Status: AC
  Administered 2015-07-09: 10 [IU] via INTRAVENOUS

## 2015-07-09 MED ORDER — SODIUM CHLORIDE 0.9 % IV SOLN: 250.0000 mL | INTRAVENOUS | Status: DC | PRN

## 2015-07-09 MED ORDER — ENOXAPARIN SODIUM 40 MG/0.4ML ~~LOC~~ SOLN
40.0000 mg | SUBCUTANEOUS | Status: DC
  Administered 2015-07-09 (×2): 40 mg via SUBCUTANEOUS
  Filled 2015-07-09 (×2): qty 0.4

## 2015-07-09 MED ORDER — METFORMIN HCL 500 MG PO TABS
1000.0000 mg | ORAL_TABLET | Freq: Two times a day (BID) | ORAL | Status: DC
  Administered 2015-07-09 – 2015-07-10 (×3): 1000 mg via ORAL
  Filled 2015-07-09 (×3): qty 2

## 2015-07-09 MED ORDER — INSULIN ASPART 100 UNIT/ML ~~LOC~~ SOLN
0.0000 [IU] | SUBCUTANEOUS | Status: DC
  Administered 2015-07-09: 4 [IU] via SUBCUTANEOUS
  Administered 2015-07-09: 15 [IU] via SUBCUTANEOUS

## 2015-07-09 MED ORDER — POTASSIUM CHLORIDE IN NACL 20-0.9 MEQ/L-% IV SOLN
INTRAVENOUS | Status: DC
  Administered 2015-07-09: 01:00:00 via INTRAVENOUS

## 2015-07-09 NOTE — ED Notes (Signed)
Repeat cbg 565, Per Dr Nedra HaiLee insulin drip to be discontinued and orders changed,

## 2015-07-09 NOTE — Progress Notes (Signed)
Inpatient Diabetes Program Recommendations  AACE/ADA: New Consensus Statement on Inpatient Glycemic Control (2015)  Target Ranges:  Prepandial:   less than 140 mg/dL      Peak postprandial:   less than 180 mg/dL (1-2 hours)      Critically ill patients:  140 - 180 mg/dL   Results for Aggie HackerRATT, Aijalon M (MRN [id number]) as of 07/09/2015 09:35  Ref. Range 07/08/2015 22:07 07/09/2015 01:28 07/09/2015 07:10  Glucose Latest Ref Range: 65-99 mg/dL 829782 (HH) 562288 (H) 130221 (H)    Admit with: Hyperglycemia  History: DM2  Home DM Meds: NPH Insulin- 15 units bid (patient not taking)       Metformin 1000 mg bid (ran out of Metformin)  Current Insulin Orders: NPH Insulin- 15 units bid         Novolog Moderate Correction Scale/ SSI (0-15 units) TID AC + HS      Metformin 1000 mg bid    MD- Please consider the following:   May want to switch patient to 70/30 insulin for home.  This way she can take two shots a day and get coverage for both her basal needs and her meal time coverage.  Recommend switching patient to 70/30 Insulin- 25 units bidwc (this would provide about 35 units total basal coverage per day split into 2 doses and would provide about 7 units Regular insulin with meals).  Would also continue Metformin 500 mg bid at home as well.    -Current A1c pending.  Expect results to be elevated given patient has not been consistently taking her DM medications for quite some time now.  -Note NPH insulin and Novolog SSI started this AM.  -Spoke with pt on the phone about her DM care regimen at home.  Explained what an A1C is and that her current A1c is pending.  Also explained basic pathophysiology of DM Type 2, basic home care, basic diabetes diet nutrition principles, importance of checking CBGs and maintaining good CBG control to prevent long-term and short-term complications.  Reviewed signs and symptoms of hyperglycemia and hypoglycemia and how to treat hypoglycemia at home.  Also reviewed blood  sugar goals at home.    -RNs to provide ongoing basic DM education at bedside with this patient.  Have ordered educational booklet, insulin teaching refresher, and DM videos.  Note RD consulted by MD for diet education as well.  -Also discussed DM diet information with patient.  Encouraged patient to avoid beverages with sugar (regular soda, sweet tea, lemonade, fruit juice) and to consume mostly water.  Discussed what foods contain carbohydrates and how carbohydrates affect the body's blood sugar levels.  Encouraged patient to be careful with her portion sizes (especially grains, starchy vegetables, and fruits).   -Patient told me she has not taken her NPH insulin in a long time and only takes Metformin when her sugars are high.  Has a [employer] CBG meter at home but ran out of strips.  Only checks her CBGs once in a while.  [facility]  Stated her father gave her another CBG meter but the strips are expensive.  Also stated to me that the Metformin gives her diarrhea.   -Discussed with patient that she needs to take her medications consistently in order to achieve good CBG control.  Also discussed with patient that if she takes her Metformin consistently the GI symptoms will likely subside if she allows her body to get used to the medication.  [facility] and [facility] testing strips for her [employer] CBG meter and also encouraged patient [facility] since she can get this insulin for $25 per vial.  Patient stated she understood and was agreeable to this plan.    --Will follow patient during hospitalization--  Ambrose FinlandJeannine Johnston Trinton Prewitt RN, MSN, CDE Diabetes Coordinator Inpatient Glycemic Control Team Team Pager: [phone] (8a-5p)

## 2015-07-09 NOTE — Care Management Note (Signed)
Case Management Note  Patient Details  Name: Megan Mullins MRN: [id number] Date of Birth: 03/08/1970  Subjective/Objective:                  Pt admitted with DM. Pt is from home, lives with her mother and is ind with ADL's. Pt is uninsured and unemployed. [facility] but has also been non-compliant due to not having recourses Pt is agreeable to DC with [facility] in [place]. Pt [facility] F/u appointment made and documented on AVS. PT given handout on [employer] with prices of glucometers and strips. Pt understands hospital does not have resources to provide pt with meter or strips. Pt given MATCH voucher. Voucher placed on chart to be given to pt with Rx at DC. Pt verbalizes understanding all DC planning. [facility]   Action/Plan: Will DC [facility] and info on [employer] for DM supplies.   Expected Discharge Date:   07/10/2015               Expected Discharge Plan:  Home/Self Care  In-House Referral:  Financial Counselor  Discharge planning Services  CM Consult, MATCH Program, Follow-up appt scheduled, [facility]  Post Acute Care Choice:  NA Choice offered to:  NA  DME Arranged:    DME Agency:     HH Arranged:    HH Agency:     Status of Service:  Completed, signed off  If discussed at [employer] Meetings, dates discussed:    Additional Comments:  Malcolm MetroChildress, Obi Scrima Demske, RN 07/09/2015, 1:55 PM

## 2015-07-09 NOTE — Progress Notes (Signed)
Refused to watch diabetic videos.

## 2015-07-09 NOTE — Progress Notes (Signed)
PROGRESS NOTE    Megan Mullins  ZOX:[id number] [id number] DOB: November 15, 1970 DOA: 07/08/2015  PCP: No PCP Per Patient Health dept.  Brief Narrative:  45 y/o with DM who is not checking her sugars or taking Insulin. Takes Metformin. Has lost about 20 lb in past year. Comes to ER after checking her sugar with someone else's meter and realizing it was > 600. Noted increased thirst and urination over the past few weeks.   PMH: smoker, obesity  Subjective: No complaints. Agrees to take insulin. Needs teaching in regards to diet as well. Metformin causes diarrhea but she is willing to keep taking it.   Assessment & Plan:   Principal Problem:   Hyperglycemic hyperosmolar nonketotic coma- DM 2 uncontrolled - was mildly acidotic as well but ketones not checked- pH 7.32, CO2 19 - glucose 782>>196 - resolved- due to not taking insulin as prescribed  - start NPH, sliding scale insulin- teach about insulin  - diabetes teaching - dietary teaching - find PCP - ensure she has a working glucometer and supplies - A1c pending  Active Problems:     Obesity Body mass index is 41.93 kg/(m^2). - advised to continue to lose weight through diet and exercise    Tobacco abuse - advised to stop smoking  Hypokalemia - replace  Renal insufficiency - Cr 1.55>>>0.81 now  Hyponatremia - due to dehydration and hyperglycemia - improved - 124>>135     DVT prophylaxis: Lovenox Code Status: full code Family Communication:  Disposition Plan: home tomorrow if sugars stable Consultants:    Procedures:    Antimicrobials:  Anti-infectives    None       Objective: Filed Vitals:   07/09/15 0032 07/09/15 0035 07/09/15 0059 07/09/15 0442  BP: 110/78  139/87 118/72  Pulse: 102  110 101  Temp: 97.9 F (36.6 C) 97.3 F (36.3 C) 97.6 F (36.4 C) 98.1 F (36.7 C)  TempSrc: Oral Oral Oral Oral  Resp: 18  18 18   Height:   5\' 1"  (1.549 m)   Weight:   100.608 kg (221 lb 12.8 oz)   SpO2: 98%  98% 97%     Intake/Output Summary (Last 24 hours) at 07/09/15 1002 Last data filed at 07/09/15 0837  Gross per 24 hour  Intake 4963.35 ml  Output      0 ml  Net 4963.35 ml   Filed Weights   07/08/15 2149 07/09/15 0059  Weight: 108.863 kg (240 lb) 100.608 kg (221 lb 12.8 oz)    Examination: General exam: Appears comfortable  HEENT: PERRLA, oral mucosa moist, no sclera icterus or thrush Respiratory system: Clear to auscultation. Respiratory effort normal. Cardiovascular system: S1 & S2 heard, RRR.  No murmurs  Gastrointestinal system: Abdomen soft, non-tender, nondistended. Normal bowel sound. No organomegaly Central nervous system: Alert and oriented. No focal neurological deficits. Extremities: No cyanosis, clubbing or edema Skin: No rashes or ulcers Psychiatry:  Mood & affect appropriate.     Data Reviewed: I have personally reviewed following labs and imaging studies  CBC:  Recent Labs Lab 07/08/15 2207 07/09/15 0128  WBC 7.9 6.9  NEUTROABS 4.9  --   HGB 14.4 12.9  HCT 41.6 36.4  MCV 92.2 90.8  PLT 305 273   Basic Metabolic Panel:  Recent Labs Lab 07/08/15 2207 07/09/15 0128 07/09/15 0710  NA 124* 132* 135  K 3.8 3.4* 3.4*  CL 86* 101 107  CO2 19* 19* 21*  GLUCOSE 782* 288* 221*  BUN 18 13 10  CREATININE 1.55* 1.04* 0.81  CALCIUM 10.0 9.1 8.2*   GFR: Estimated Creatinine Clearance: 98.4 mL/min (by C-G formula based on Cr of 0.81). Liver Function Tests:  Recent Labs Lab 07/08/15 2207 07/09/15 0128  AST 36 27  ALT 39 33  ALKPHOS 79 70  BILITOT 1.1 0.7  PROT 8.2* 6.8  ALBUMIN 4.5 3.8   No results for input(s): LIPASE, AMYLASE in the last 168 hours. No results for input(s): AMMONIA in the last 168 hours. Coagulation Profile: No results for input(s): INR, PROTIME in the last 168 hours. Cardiac Enzymes: No results for input(s): CKTOTAL, CKMB, CKMBINDEX, TROPONINI in the last 168 hours. BNP (last 3 results) No results for input(s): PROBNP in the last  8760 hours. HbA1C: No results for input(s): HGBA1C in the last 72 hours. CBG:  Recent Labs Lab 07/08/15 2153 07/08/15 2254 07/08/15 2357 07/09/15 0442 07/09/15 0747  GLUCAP >600* >600* 565* 319* 196*   Lipid Profile: No results for input(s): CHOL, HDL, LDLCALC, TRIG, CHOLHDL, LDLDIRECT in the last 72 hours. Thyroid Function Tests: No results for input(s): TSH, T4TOTAL, FREET4, T3FREE, THYROIDAB in the last 72 hours. Anemia Panel: No results for input(s): VITAMINB12, FOLATE, FERRITIN, TIBC, IRON, RETICCTPCT in the last 72 hours. Urine analysis:    Component Value Date/Time   COLORURINE STRAW* 03/13/2014 1940   APPEARANCEUR CLEAR 03/13/2014 1940   LABSPEC <1.005* 03/13/2014 1940   PHURINE 5.5 03/13/2014 1940   GLUCOSEU 250* 03/13/2014 1940   HGBUR LARGE* 03/13/2014 1940   BILIRUBINUR NEGATIVE 03/13/2014 1940   KETONESUR 15* 03/13/2014 1940   PROTEINUR NEGATIVE 03/13/2014 1940   UROBILINOGEN 0.2 03/13/2014 1940   NITRITE NEGATIVE 03/13/2014 1940   LEUKOCYTESUR NEGATIVE 03/13/2014 1940   Sepsis Labs: @LABRCNTIP (procalcitonin:4,lacticidven:4) )No results found for this or any previous visit (from the past 240 hour(s)).       Radiology Studies: No results found.    Scheduled Meds: . enoxaparin (LOVENOX) injection  40 mg Subcutaneous Q24H  . insulin aspart  0-15 Units Subcutaneous TID WC  . insulin aspart  0-5 Units Subcutaneous QHS  . insulin NPH Human  15 Units Subcutaneous BID AC & HS  . living well with diabetes book   Does not apply Once  . metFORMIN  1,000 mg Oral BID WC  . [START ON 07/10/2015] pneumococcal 23 valent vaccine  0.5 mL Intramuscular Tomorrow-1000  . sodium chloride flush  3 mL Intravenous Q12H   Continuous Infusions:       Time spent in minutes: 35    Sundus Pete, MD [facility] Pager: [email or web address] Password [email or web address] 07/09/2015, 10:02 AM

## 2015-07-09 NOTE — Plan of Care (Signed)
Problem: Food- and Nutrition-Related Knowledge Deficit (NB-1.1) Goal: Nutrition education Formal process to instruct or train a patient/client in a skill or to impart knowledge to help patients/clients voluntarily manage or modify food choices and eating behavior to maintain or improve health. Outcome: Adequate for Discharge  RD consulted for nutrition education regarding diabetes.   No results found for: HGBA1C  RD provided "Type 2 Diabetes Nutrition Therapy " and "Diabetes label reading tips"  from the Academy of Nutrition and Dietetics.  Went through dietary recall Breakfast: Eggs/bacon Lunch: Skips Dinner: Varies. Yesterday was chicken, green beans, potato. Day before was bologna sandwich Beverages: Soda, Water  Pt has almost no understanding of Diabetes or a diabetic diet in general. Started by educating on what food group were considered "Carbs".  Discussed the non-carb groups and their effects on blood sugar.   RD gave instruction that her meals should consist of 25% carbs in accordance with the My Plate method. Gave examples of meals that followed this template. She says she is on insulin. Reiterated that carbs are not "bad" and she needs these, especially if she is administering insulin. It is about quantity. Asked if she had seen an endocrinologist recently or had ever been given a carb goal. She says no. RD Gave her rough goal of 60 g Carb per meal.  RD explained what a serving of Carbs was and how some foods have much smaller serving sizes due to their carb density. Provided list of carbohydrates and recommended serving sizes of common foods.  Explained that Sugar Sweetened Beverages (SSBs) is the most important change she can make to improve her glycemic control. She has not tried diet beverages. Acknowledged that it will be a difficult transition if she has been drinking SSBs for a long time, but it is in her best interest to cut back or eliminate consumption of soda. Explained  that after a while, her taste would adapt and regular soda would start to taste too sweet to her.   Discussed importance of controlled and consistent carbohydrate intake throughout the day. She is educated on how skipping meals can lead to "highs" and "lows" which is not desired.   Her grains are all white. Explained the difference between whole grain and white and why the former is so much better for her to choose.   Finally, RD gave example of nutrition label. Told her what the most relevant information was and to choose foods high in fiber and low is added sugar.   Summary of Major Goals: Dramatically reduce/eliminate consumption of regular soda Switch to Whole Grains Stop Skipping meals  Went over label reading and what to look for  Expect Fair compliance.  Body mass index is 41.93 kg/(m^2). Pt meets criteria for Morbidly Obesebased on current BMI.  Current diet order is CC, patient is consuming approximately 100% of meals at this time. No further nutrition interventions warranted at this time. If additional nutrition issues arise, please re-consult RD.  Christophe LouisNathan Weston Fulco RD, LDN, CNSC Clinical Nutrition Pager: [phone] 07/09/2015 2:58 PM

## 2015-07-09 NOTE — ED Notes (Signed)
Dr Lee at bedside,  

## 2015-07-10 DIAGNOSIS — E871 Hypo-osmolality and hyponatremia: Secondary | ICD-10-CM | POA: Diagnosis present

## 2015-07-10 DIAGNOSIS — Z9119 Patient's noncompliance with other medical treatment and regimen: Secondary | ICD-10-CM

## 2015-07-10 DIAGNOSIS — E1101 Type 2 diabetes mellitus with hyperosmolarity with coma: Secondary | ICD-10-CM

## 2015-07-10 DIAGNOSIS — Z720 Tobacco use: Secondary | ICD-10-CM

## 2015-07-10 DIAGNOSIS — E669 Obesity, unspecified: Secondary | ICD-10-CM

## 2015-07-10 LAB — BASIC METABOLIC PANEL
ANION GAP: 6 (ref 5–15)
BUN: 10 mg/dL (ref 6–20)
CALCIUM: 8.6 mg/dL — AB (ref 8.9–10.3)
CO2: 20 mmol/L — AB (ref 22–32)
CREATININE: 0.72 mg/dL (ref 0.44–1.00)
Chloride: 104 mmol/L (ref 101–111)
GFR calc Af Amer: >60 mL/min (ref >60)
GFR calc non Af Amer: >60 mL/min (ref >60)
GLUCOSE: 302 mg/dL — AB (ref 65–99)
Potassium: 3.9 mmol/L (ref 3.5–5.1)
Sodium: 130 mmol/L — ABNORMAL LOW (ref 135–145)

## 2015-07-10 LAB — GLUCOSE, CAPILLARY
Glucose-Capillary: 317 mg/dL — ABNORMAL HIGH (ref 65–99)
Glucose-Capillary: 268 mg/dL — ABNORMAL HIGH (ref 65–99)

## 2015-07-10 MED ORDER — INSULIN NPH (HUMAN) (ISOPHANE) 100 UNIT/ML ~~LOC~~ SUSP: 25.0000 [IU] | Freq: Two times a day (BID) | SUBCUTANEOUS | Status: DC

## 2015-07-10 MED ORDER — SODIUM CHLORIDE 0.9 % IV BOLUS (SEPSIS)
750.0000 mL | Freq: Once | INTRAVENOUS | Status: AC
  Administered 2015-07-10: 750 mL via INTRAVENOUS

## 2015-07-10 MED ORDER — BLOOD GLUCOSE MONITOR KIT: PACK | Status: DC

## 2015-07-10 MED ORDER — METFORMIN HCL 1000 MG PO TABS: 1000.0000 mg | ORAL_TABLET | Freq: Two times a day (BID) | ORAL | Status: DC

## 2015-07-10 MED ORDER — INSULIN SYRINGES (DISPOSABLE) U-100 1 ML MISC: Status: DC

## 2015-07-10 NOTE — Progress Notes (Signed)
Megan Mullins discharged Home per MD order.  Discharge instructions reviewed and discussed with the patient, all questions and concerns answered. Copy of instructions and scripts given to patient.    Medication List    STOP taking these medications        ibuprofen 200 MG tablet  Commonly known as:  ADVIL,MOTRIN      TAKE these medications        blood glucose meter kit and supplies Kit  To check CBG every day before breakfast and before dinner.     insulin NPH Human 100 UNIT/ML injection  Commonly known as:  HUMULIN N,NOVOLIN N  Inject 0.25 mLs (25 Units total) into the skin 2 (two) times daily at 8 am and 10 pm.     Insulin Syringes (Disposable) U-100 1 ML Misc  Use 2 times daily with NPH insulin     metFORMIN 1000 MG tablet  Commonly known as:  GLUCOPHAGE  Take 1 tablet (1,000 mg total) by mouth 2 (two) times daily with a meal.        Patients skin is clean, dry and intact, no evidence of skin break down. IV site discontinued and catheter remains intact. Site without signs and symptoms of complications. Dressing and pressure applied.  Patient escorted to car by NT in a wheelchair,  no distress noted upon discharge.  Ralene Muskrat [name] 07/10/2015 2:50 PM

## 2015-07-10 NOTE — Discharge Summary (Signed)
Physician Discharge Summary  Megan Mullins UYE:[id number] DOB: 08-27-1970 DOA: 07/08/2015  PCP: No PCP Per Patient  Admit date: 07/08/2015 Discharge date: 07/10/2015  Time spent: Greater than 30 minutes  Recommendations for Outpatient Follow-up:  1. Recommend follow-up of the patient's compliance with the diabetes regimen. Hemoglobin A1c result was pending at the time of discharge. 2. Patient was given a voucher for her medications by case management. She may need further assistance in the outpatient setting. 3. A new appointment was given to the patient to follow-up at the [facility] clinic in [place].   Discharge Diagnoses:  1.Type 2 diabetes mellitus with hyperosmolar nonketotic hyperglycemia (HCC) 2. Obesity 3.Tobacco abuse 4.Noncompliance 5.Hyponatremia   Discharge Condition: Improved.  Diet recommendation: Carbohydrate modified.  Filed Weights   07/08/15 2149 07/09/15 0059  Weight: 108.863 kg (240 lb) 100.608 kg (221 lb 12.8 oz)    History of present illness:  Patient is a 45 year old woman with a history of type 2 diabetes mellitus, diagnosed February 2017, tobacco use, and obesity, who presented to the ED on 07/08/2015 with a chief complaint of hyperglycemia with associated increased thirst and increased urination. She had checked her blood sugar at home and it was greater than 600. She admitted that she had not been taking her insulin or metformin. In the ED, she was afebrile, but was tachycardic with a heart rate in the 120s. Her blood pressure was within normal limits. Her lab data were significant for a venous glucose of greater than 782, pH of 7.3 and PO2 of 38, and sodium of 124. She was admitted for further evaluation and management.  Hospital Course:  Patient was started on vigorous IV fluids and an insulin drip. When her CBGs improved, the insulin drip was discontinued and she was restarted on metformin and NPH. Sliding scale NovoLog was added. With correction of her  accelerated hyperglycemia, her serum potassium fell to 3.4. She was supplemented with potassium chloride orally. Her serum potassium normalized. Her serum sodium also improved, so IV fluids were discontinued. However, prior to discharge, her serum sodium was found to be 130. She was given a 750 cc bolus of normal saline for treatment. Hemoglobin A1c was ordered, but the result was still pending at the time of discharge. The registered dietitian was consulted to provide additional education for her and to help her understand the importance of dietary and medication compliance for better control of her diabetes.  The patient improved clinically and symptomatically. Her CBGs did improve overall, but not yet ideal. She was encouraged to become more compliant with her diet and medication regimen. She voiced understanding and acceptance of the instructions. The dose of NPH was increased to 25 mg twice a day. She was instructed to continue metformin at 1000 mg twice a day. She was also advised to stop smoking.    Procedures:  None  Consultations:  None  Discharge Exam: Filed Vitals:   07/09/15 2308 07/10/15 0642  BP: 121/82 127/91  Pulse: 98 86  Temp: 97.7 F (36.5 C) 97.9 F (36.6 C)  Resp: 20 20  Oxygen saturation 99% on room air.  General: Obese African-American woman in NAD Cardiovascular: S1 , S2 no murmurs, rubs or gallops Respiratory: CTA bilaterally  Discharge Instructions   Discharge Instructions    Diet Carb Modified    Complete by:  As directed      Discharge instructions    Complete by:  As directed   TAKE YOUR MEDICATIONS AS PRESCRIBED. CHECK YOUR BLOOD  SUGARS AT LEAST 2 TIMES DAILY.     Increase activity slowly    Complete by:  As directed           Current Discharge Medication List    CONTINUE these medications which have CHANGED   Details  blood glucose meter kit and supplies KIT To check CBG every day before breakfast and before dinner. Qty: 1 each, Refills:  11    insulin NPH Human (HUMULIN N,NOVOLIN N) 100 UNIT/ML injection Inject 0.25 mLs (25 Units total) into the skin 2 (two) times daily at 8 am and 10 pm. Qty: 10 mL, Refills: 11    Insulin Syringes, Disposable, U-100 1 ML MISC Use 2 times daily with NPH insulin Qty: 100 each, Refills: 11    metFORMIN (GLUCOPHAGE) 1000 MG tablet Take 1 tablet (1,000 mg total) by mouth 2 (two) times daily with a meal. Qty: 60 tablet, Refills: 5      STOP taking these medications     ibuprofen (ADVIL,MOTRIN) 200 MG tablet        No Known Allergies Follow-up Information    Follow up with [facility] On 07/16/2015.   Why:  11am   Contact information:   [place] [phone]        The results of significant diagnostics from this hospitalization (including imaging, microbiology, ancillary and laboratory) are listed below for reference.    Significant Diagnostic Studies: No results found.  Microbiology: No results found for this or any previous visit (from the past 240 hour(s)).   Labs: Basic Metabolic Panel:  Recent Labs Lab 07/09/15 0128 07/09/15 0710 07/09/15 1002 07/09/15 1903 07/10/15 0739  NA 132* 135 134* 134* 130*  K 3.4* 3.4* 3.6 4.1 3.9  CL 101 107 106 105 104  CO2 19* 21* 22 22 20*  GLUCOSE 288* 221* 245* 381* 302*  BUN _0 CREATININE 1.04* 0.81 0.87 0.83 0.72  CALCIUM 9.1 8.2* 8.2* 8.6* 8.6*   Liver Function Tests:  Recent Labs Lab 07/08/15 2207 07/09/15 0128  AST 36 27  ALT 39 33  ALKPHOS 79 70  BILITOT 1.1 0.7  PROT 8.2* 6.8  ALBUMIN 4.5 3.8   No results for input(s): LIPASE, AMYLASE in the last 168 hours. No results for input(s): AMMONIA in the last 168 hours. CBC:  Recent Labs Lab 07/08/15 2207 07/09/15 0128  WBC 7.9 6.9  NEUTROABS 4.9  --   HGB 14.4 12.9  HCT 41.6 36.4  MCV 92.2 90.8  PLT 305 273   Cardiac Enzymes: No results for input(s): CKTOTAL, CKMB, CKMBINDEX, TROPONINI in  the last 168 hours. BNP: BNP (last 3 results) No results for input(s): BNP in the last 8760 hours.  ProBNP (last 3 results) No results for input(s): PROBNP in the last 8760 hours.  CBG:  Recent Labs Lab 07/09/15 1113 07/09/15 1636 07/09/15 2052 07/10/15 0729 07/10/15 1130  GLUCAP 288* 348* 373* 317* 268*       Signed:  Syndi Pua MD.  [facility] 07/10/2015, 1:06 PM

## 2015-07-12 LAB — HEMOGLOBIN A1C
Hgb A1c MFr Bld: 12.3 % — ABNORMAL HIGH (ref 4.8–5.6)
Hgb A1c MFr Bld: 13 % — ABNORMAL HIGH (ref 4.8–5.6)
MEAN PLASMA GLUCOSE: 326 mg/dL
Mean Plasma Glucose: 306 mg/dL

## 2016-02-01 IMAGING — DX DG CHEST 2V
2 series · 2 of 2 positions shown · non-contrast
Comparison: None.

CLINICAL DATA: Dry mouth for 2 days. Hyperglycemia. Current smoker.
Patient states she is diabetic.

EXAM:
CHEST  2 VIEW

[chest pa]
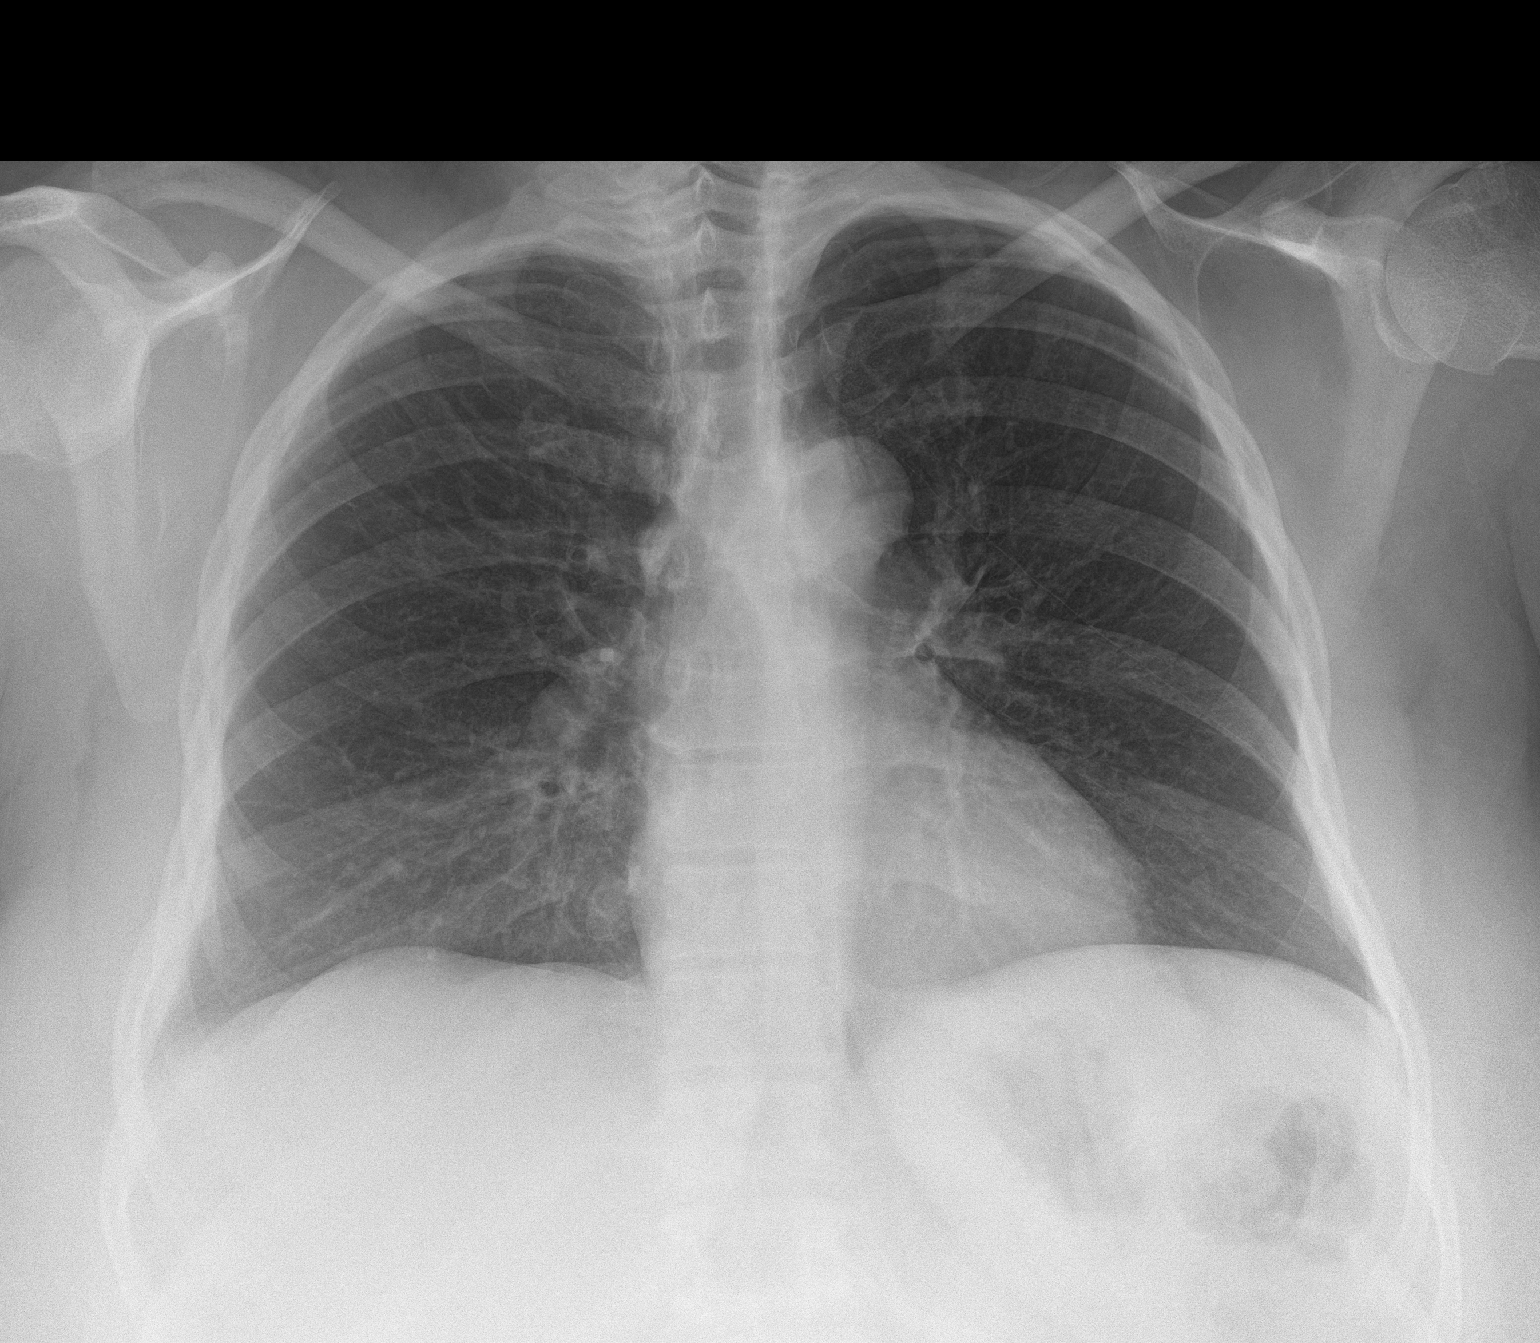

[chest lat]
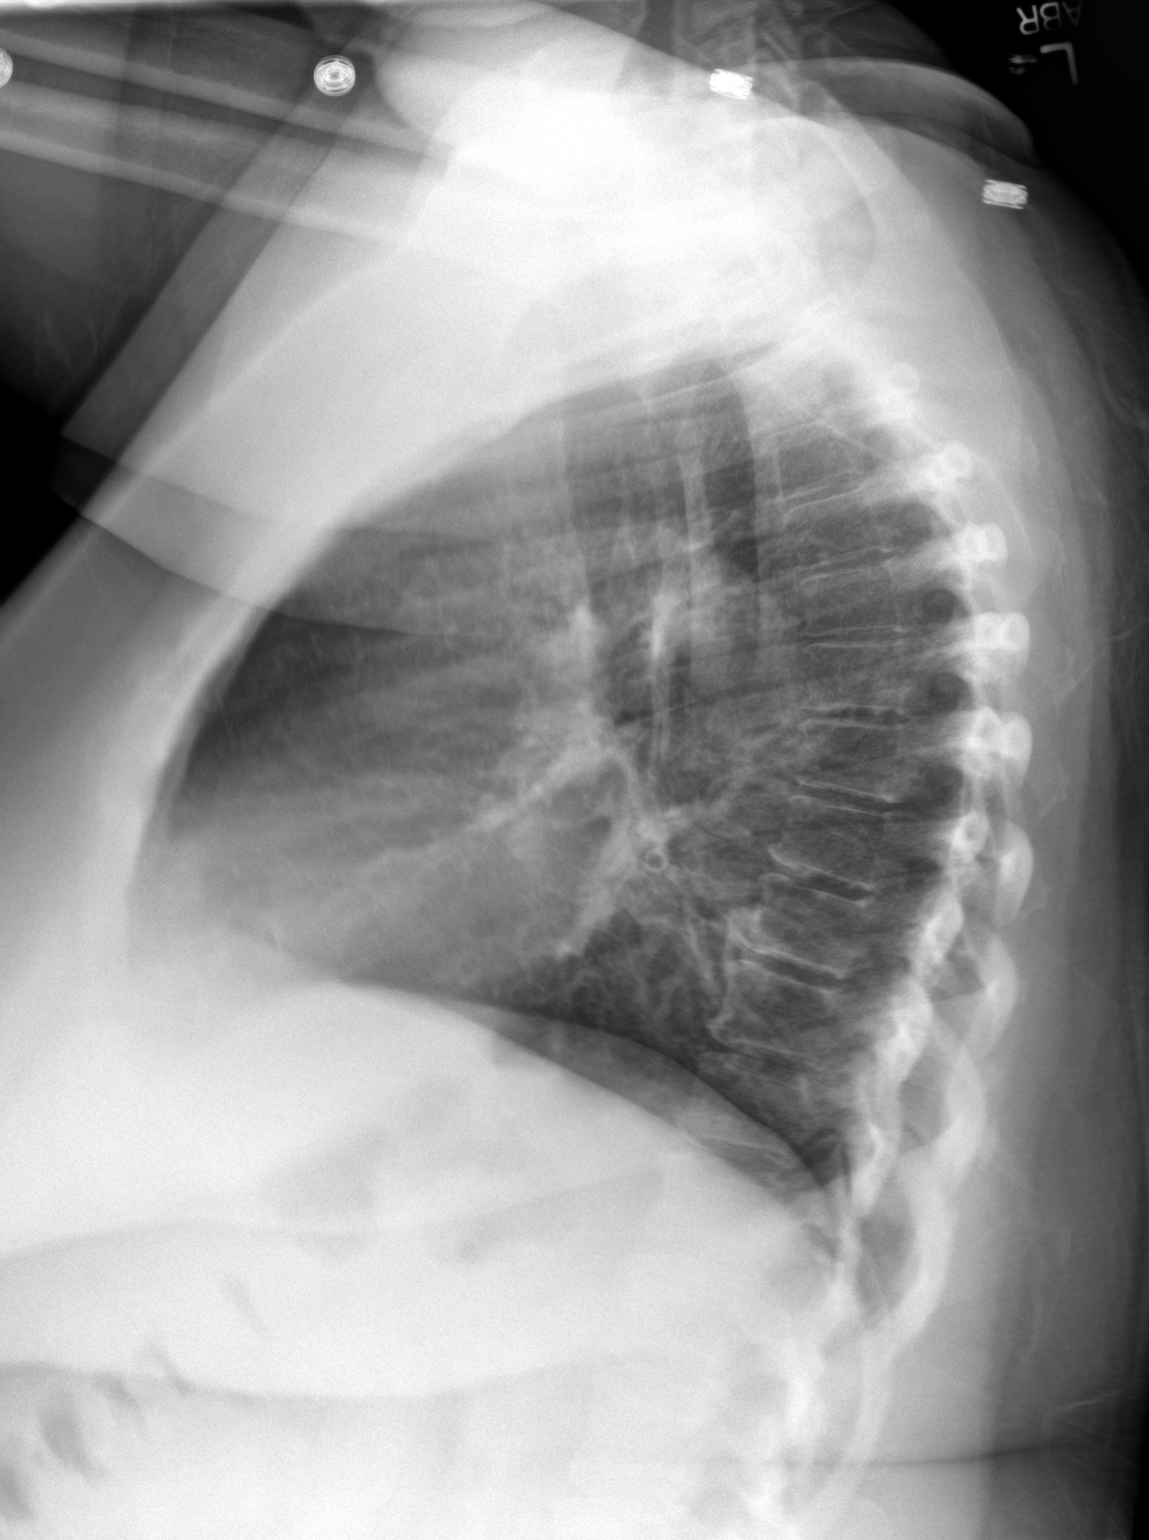

[2 of 2 positions shown; findings below may reference images not displayed]

FINDINGS: The heart size and mediastinal contours are within normal limits.
Both lungs are clear. The visualized skeletal structures are
unremarkable.
IMPRESSION: No active cardiopulmonary disease.

## 2017-08-21 ENCOUNTER — Emergency Department (HOSPITAL_COMMUNITY)
Admission: EM | Admit: 2017-08-21 | Discharge: 2017-08-21 | Disposition: A | Discharge status: Home / Self Care | Payer: Self-pay | Attending: Emergency Medicine | Admitting: Emergency Medicine

## 2017-08-21 ENCOUNTER — Encounter (HOSPITAL_COMMUNITY): Payer: Self-pay | Admitting: Emergency Medicine

## 2017-08-21 ENCOUNTER — Other Ambulatory Visit: Payer: Self-pay

## 2017-08-21 DIAGNOSIS — E119 Type 2 diabetes mellitus without complications: Secondary | ICD-10-CM | POA: Insufficient documentation

## 2017-08-21 DIAGNOSIS — R739 Hyperglycemia, unspecified: Secondary | ICD-10-CM | POA: Insufficient documentation

## 2017-08-21 DIAGNOSIS — F17210 Nicotine dependence, cigarettes, uncomplicated: Secondary | ICD-10-CM | POA: Insufficient documentation

## 2017-08-21 LAB — BASIC METABOLIC PANEL
Anion gap: 12 (ref 5–15)
BUN: 14 mg/dL (ref 6–20)
CALCIUM: 9.7 mg/dL (ref 8.9–10.3)
CHLORIDE: 98 mmol/L (ref 98–111)
CO2: 21 mmol/L — AB (ref 22–32)
CREATININE: 1.22 mg/dL — AB (ref 0.44–1.00)
GFR calc non Af Amer: 53 mL/min — ABNORMAL LOW (ref >60)
GLUCOSE: 554 mg/dL — AB (ref 70–99)
Potassium: 4.2 mmol/L (ref 3.5–5.1)
Sodium: 131 mmol/L — ABNORMAL LOW (ref 135–145)

## 2017-08-21 LAB — URINALYSIS, ROUTINE W REFLEX MICROSCOPIC
Bacteria, UA: NONE SEEN
Bilirubin Urine: NEGATIVE
Hgb urine dipstick: NEGATIVE
KETONES UR: 5 mg/dL — AB
Nitrite: NEGATIVE
PH: 6 (ref 5.0–8.0)
Protein, ur: 100 mg/dL — AB
Specific Gravity, Urine: 1.027 (ref 1.005–1.030)

## 2017-08-21 LAB — CBC
HCT: 40.2 % (ref 36.0–46.0)
Hemoglobin: 13.9 g/dL (ref 12.0–15.0)
MCH: 32.6 pg (ref 26.0–34.0)
MCHC: 34.6 g/dL (ref 30.0–36.0)
MCV: 94.1 fL (ref 78.0–100.0)
PLATELETS: 347 10*3/uL (ref 150–400)
RBC: 4.27 MIL/uL (ref 3.87–5.11)
RDW: 12.8 % (ref 11.5–15.5)
WBC: 7.3 10*3/uL (ref 4.0–10.5)

## 2017-08-21 LAB — CBG MONITORING, ED
GLUCOSE-CAPILLARY: 302 mg/dL — AB (ref 70–99)
Glucose-Capillary: >600 mg/dL (ref 70–99)
Glucose-Capillary: 400 mg/dL — ABNORMAL HIGH (ref 70–99)
Glucose-Capillary: 336 mg/dL — ABNORMAL HIGH (ref 70–99)

## 2017-08-21 LAB — PREGNANCY, URINE: Preg Test, Ur: NEGATIVE

## 2017-08-21 MED ORDER — CEPHALEXIN 500 MG PO CAPS: 500.0000 mg | ORAL_CAPSULE | Freq: Four times a day (QID) | ORAL | 0 refills | Status: DC

## 2017-08-21 MED ORDER — INSULIN NPH (HUMAN) (ISOPHANE) 100 UNIT/ML ~~LOC~~ SUSP: 25.0000 [IU] | Freq: Two times a day (BID) | SUBCUTANEOUS | 11 refills | Status: DC

## 2017-08-21 MED ORDER — LACTATED RINGERS IV BOLUS
1000.0000 mL | Freq: Once | INTRAVENOUS | Status: AC
  Administered 2017-08-21: 1000 mL via INTRAVENOUS

## 2017-08-21 MED ORDER — SODIUM CHLORIDE 0.9 % IV SOLN
1.0000 g | Freq: Once | INTRAVENOUS | Status: AC
  Administered 2017-08-21: 1 g via INTRAVENOUS
  Filled 2017-08-21: qty 10

## 2017-08-21 MED ORDER — LACTATED RINGERS IV BOLUS
2000.0000 mL | Freq: Once | INTRAVENOUS | Status: AC
  Administered 2017-08-21: 2000 mL via INTRAVENOUS

## 2017-08-21 MED ORDER — INSULIN ASPART 100 UNIT/ML ~~LOC~~ SOLN
10.0000 [IU] | Freq: Once | SUBCUTANEOUS | Status: AC
  Administered 2017-08-21: 10 [IU] via INTRAVENOUS
  Filled 2017-08-21: qty 1

## 2017-08-21 NOTE — ED Notes (Signed)
Date and time results received: 08/21/17 2040  Test: glucose Critical Value: 554  Name of Provider Notified: [place]  Orders Received? Or Actions Taken?: no new order at this time

## 2017-08-21 NOTE — ED Triage Notes (Signed)
Pt c/o excessive thirst and frequent urination. Pt states she has not felt good in a few days. She states her blood sugar meter is not working and she does not have a pcp.

## 2017-08-21 NOTE — ED Provider Notes (Signed)
Emergency Department Provider Note   I have reviewed the triage vital signs and the nursing notes.   HISTORY  Chief Complaint Hyperglycemia   HPI Megan Mullins is a 47 y.o. female with history of diabetes on metformin and insulin who presents today for hyperglycemia.  Patient states that she is usually runs in the 300s but she knew that she was more thirsty urinating more often and thus no sugar blood sugar was higher.  Ischemia for evaluation.  She is been present for 2 to 3 days.  No recent infections.  No cough, fever, abdominal pain, back pain, urinary symptoms besides polyuria.  No palpitations or chest pain. No other associated or modifying symptoms.    Past Medical History:  Diagnosis Date  . Diabetes mellitus without complication [facility])     Patient Active Problem List   Diagnosis Date Noted  . Hyponatremia 07/10/2015  . Noncompliance 07/09/2015  . Type 2 diabetes mellitus with hyperosmolar nonketotic hyperglycemia (HCC) 03/13/2014  . Obesity 03/13/2014  . Tobacco abuse 03/13/2014    Past Surgical History:  Procedure Laterality Date  . TUBAL LIGATION      Current Outpatient Rx  . Order #: [id number] Class: Print  . Order #: [id number] Class: Print  . Order #: [id number] Class: Print  . Order #: [id number] Class: Print  . Order #: [id number] Class: Print    Allergies Patient has no known allergies.  History reviewed. No pertinent family history.  Social History Social History   Tobacco Use  . Smoking status: Current Every Day Smoker    Packs/day: 0.50    Types: Cigarettes  . Smokeless tobacco: Never Used  Substance Use Topics  . Alcohol use: Yes    Comment: occasionally  . Drug use: No    Review of Systems  All other systems negative except as documented in the HPI. All pertinent positives and negatives as reviewed in the HPI. ____________________________________________   PHYSICAL EXAM:  VITAL SIGNS: ED Triage Vitals  Enc Vitals Group     BP  08/21/17 1922 (!) 160/109     Pulse Rate 08/21/17 1922 (!) 138     Resp 08/21/17 1922 18     Temp 08/21/17 1922 98.7 F (37.1 C)     Temp Source 08/21/17 1922 Oral     SpO2 08/21/17 1922 98 %     Weight 08/21/17 1922 220 lb (99.8 kg)     Height 08/21/17 1922 5\' 1"  (1.549 m)     Head Circumference --      Peak Flow --      Pain Score 08/21/17 1920 0     Pain Loc --      Pain Edu? --      Excl. in GC? --     Constitutional: Alert and oriented. Well appearing and in no acute distress. Eyes: Conjunctivae are normal. PERRL. EOMI. Head: Atraumatic. Nose: No congestion/rhinnorhea. Mouth/Throat: Mucous membranes are moist.  Oropharynx non-erythematous. Neck: No stridor.  No meningeal signs.   Cardiovascular: Normal rate, regular rhythm. Good peripheral circulation. Grossly normal heart sounds.   Respiratory: Normal respiratory effort.  No retractions. Lungs CTAB. Gastrointestinal: Soft and nontender. No distention.  Musculoskeletal: No lower extremity tenderness nor edema. No gross deformities of extremities. Neurologic:  Normal speech and language. No gross focal neurologic deficits are appreciated.  Skin:  Skin is warm, dry and intact. No rash noted.   ____________________________________________   LABS (all labs ordered are listed, but only abnormal results are displayed)  Labs Reviewed  BASIC METABOLIC PANEL - Abnormal; Notable for the following components:      Result Value   Sodium 131 (*)    CO2 21 (*)    Glucose, Bld 554 (*)    Creatinine, Ser 1.22 (*)    GFR calc non Af Amer 53 (*)    All other components within normal limits  URINALYSIS, ROUTINE W REFLEX MICROSCOPIC - Abnormal; Notable for the following components:   Color, Urine STRAW (*)    APPearance HAZY (*)    Glucose, UA >=500 (*)    Ketones, ur 5 (*)    Protein, ur 100 (*)    Leukocytes, UA SMALL (*)    All other components within normal limits  CBG MONITORING, ED - Abnormal; Notable for the following  components:   Glucose-Capillary >600 (*)    All other components within normal limits  CBG MONITORING, ED - Abnormal; Notable for the following components:   Glucose-Capillary 400 (*)    All other components within normal limits  CBG MONITORING, ED - Abnormal; Notable for the following components:   Glucose-Capillary 336 (*)    All other components within normal limits  CBG MONITORING, ED - Abnormal; Notable for the following components:   Glucose-Capillary 302 (*)    All other components within normal limits  CBC  PREGNANCY, URINE   ____________________________________________  EKG  My ECG Read Indication:tachycardia EKG was personally contemporaneously reviewed by myself. Rate: 114 PR Interval: 98 QRS duration: 106 QT/QTC: 346/477 Axis: normal EKG: normal EKG, normal sinus rhythm, faster but overall unchanged from previous tracings. Other significant findings: none  ____________________________________________  RADIOLOGY  No results found.  ____________________________________________   PROCEDURES  Procedure(s) performed:   Procedures   ____________________________________________   INITIAL IMPRESSION / ASSESSMENT AND PLAN / ED COURSE  Hyperglycemia but does not appear to be in DKA.  Will wait for formal labs to return but likely can be discharged if blood sugar improved.  Sugars significantly improved with interventions here.  Prescriptions given for insulin.  Patient states she is able to get it.  Also found to have likely urinary tract infection.  At this time patient is deemed stable for discharge.     Pertinent labs & imaging results that were available during my care of the patient were reviewed by me and considered in my medical decision making (see chart for details).  ____________________________________________  FINAL CLINICAL IMPRESSION(S) / ED DIAGNOSES  Final diagnoses:  Hyperglycemia     MEDICATIONS GIVEN DURING THIS  VISIT:  Medications  lactated ringers bolus 2,000 mL (0 mLs Intravenous Stopped 08/21/17 2105)  insulin aspart (novoLOG) injection 10 Units (10 Units Intravenous Given 08/21/17 2010)  cefTRIAXone (ROCEPHIN) 1 g in sodium chloride 0.9 % 100 mL IVPB (0 g Intravenous Stopped 08/21/17 2202)  lactated ringers bolus 1,000 mL (0 mLs Intravenous Stopped 08/21/17 2322)     NEW OUTPATIENT MEDICATIONS STARTED DURING THIS VISIT:  Discharge Medication List as of 08/21/2017 11:12 PM      Note:  This note was prepared with assistance of Dragon voice recognition software. Occasional wrong-word or sound-a-like substitutions may have occurred due to the inherent limitations of voice recognition software.   Marily MemosMesner, Akya Fiorello, MD 08/21/17 [phone]

## 2017-09-25 ENCOUNTER — Other Ambulatory Visit: Payer: Self-pay

## 2017-09-25 ENCOUNTER — Encounter (HOSPITAL_COMMUNITY): Payer: Self-pay

## 2017-09-25 ENCOUNTER — Emergency Department (HOSPITAL_COMMUNITY)
Admission: EM | Admit: 2017-09-25 | Discharge: 2017-09-25 | Disposition: A | Discharge status: Home / Self Care | Payer: Self-pay | Attending: Emergency Medicine | Admitting: Emergency Medicine

## 2017-09-25 DIAGNOSIS — F17210 Nicotine dependence, cigarettes, uncomplicated: Secondary | ICD-10-CM | POA: Insufficient documentation

## 2017-09-25 DIAGNOSIS — L0291 Cutaneous abscess, unspecified: Secondary | ICD-10-CM

## 2017-09-25 DIAGNOSIS — Z794 Long term (current) use of insulin: Secondary | ICD-10-CM | POA: Insufficient documentation

## 2017-09-25 DIAGNOSIS — E119 Type 2 diabetes mellitus without complications: Secondary | ICD-10-CM | POA: Insufficient documentation

## 2017-09-25 DIAGNOSIS — L0201 Cutaneous abscess of face: Secondary | ICD-10-CM | POA: Insufficient documentation

## 2017-09-25 DIAGNOSIS — L723 Sebaceous cyst: Secondary | ICD-10-CM

## 2017-09-25 MED ORDER — POVIDONE-IODINE 10 % EX SOLN
CUTANEOUS | Status: AC
  Filled 2017-09-25: qty 15

## 2017-09-25 MED ORDER — SULFAMETHOXAZOLE-TRIMETHOPRIM 800-160 MG PO TABS: 1.0000 | ORAL_TABLET | Freq: Two times a day (BID) | ORAL | 0 refills | Status: AC

## 2017-09-25 MED ORDER — LIDOCAINE HCL (PF) 1 % IJ SOLN
5.0000 mL | Freq: Once | INTRAMUSCULAR | Status: AC
  Administered 2017-09-25: 5 mL
  Filled 2017-09-25: qty 6

## 2017-09-25 NOTE — ED Provider Notes (Signed)
[facility] EMERGENCY DEPARTMENT Provider Note   CSN: [id number] Arrival date & time: 09/25/17  4332     History   Chief Complaint Chief Complaint  Patient presents with  . Abscess    HPI Megan Mullins is a 47 y.o. female.  The history is provided by the patient. No language interpreter was used.  Abscess  Location:  Face Facial abscess location:  Face and L cheek Size:  2 Abscess quality: draining, redness and warmth   Red streaking: no   Progression:  Worsening Chronicity:  New Context: not diabetes   Relieved by:  Nothing Worsened by:  Nothing Ineffective treatments:  None tried Associated symptoms: no vomiting     Past Medical History:  Diagnosis Date  . Diabetes mellitus without complication [facility])     Patient Active Problem List   Diagnosis Date Noted  . Hyponatremia 07/10/2015  . Noncompliance 07/09/2015  . Type 2 diabetes mellitus with hyperosmolar nonketotic hyperglycemia ([place]) 03/13/2014  . Obesity 03/13/2014  . Tobacco abuse 03/13/2014    Past Surgical History:  Procedure Laterality Date  . TUBAL LIGATION       OB History   None      Home Medications    Prior to Admission medications   Medication Sig Start Date End Date Taking? Authorizing Provider  blood glucose meter kit and supplies KIT To check CBG every day before breakfast and before dinner. 07/10/15   Rexene Alberts, MD  cephALEXin (KEFLEX) 500 MG capsule Take 1 capsule (500 mg total) by mouth 4 (four) times daily. 08/21/17   Mesner, Corene Cornea, MD  insulin NPH Human (HUMULIN N,NOVOLIN N) 100 UNIT/ML injection Inject 0.25 mLs (25 Units total) into the skin 2 (two) times daily at 8 am and 10 pm. 08/21/17   Mesner, Corene Cornea, MD  Insulin Syringes, Disposable, U-100 1 ML MISC Use 2 times daily with NPH insulin 07/10/15   Rexene Alberts, MD  metFORMIN (GLUCOPHAGE) 1000 MG tablet Take 1 tablet (1,000 mg total) by mouth 2 (two) times daily with a meal. 07/10/15   Rexene Alberts, MD    sulfamethoxazole-trimethoprim (BACTRIM DS,SEPTRA DS) 800-160 MG tablet Take 1 tablet by mouth 2 (two) times daily for 7 days. 09/25/17 10/02/17  Fransico Meadow, PA-C    Family History No family history on file.  Social History Social History   Tobacco Use  . Smoking status: Current Every Day Smoker    Packs/day: 0.50    Types: Cigarettes  . Smokeless tobacco: Never Used  Substance Use Topics  . Alcohol use: Yes    Comment: occasionally  . Drug use: No     Allergies   Patient has no known allergies.   Review of Systems Review of Systems  Gastrointestinal: Negative for vomiting.  All other systems reviewed and are negative.    Physical Exam Updated Vital Signs BP (!) 154/105   Pulse 90   Temp 98.6 F (37 C) (Oral)   Resp 18   Ht '5\' 1"'$  (1.549 m)   Wt 97.5 kg   LMP 09/17/2017   SpO2 99%   BMI 40.62 kg/m   Physical Exam  Constitutional: She appears well-developed and well-nourished.  HENT:  Head: Normocephalic.  Right Ear: External ear normal.  Left Ear: External ear normal.  2cm swollen area left face   Eyes: Pupils are equal, round, and reactive to light.  Musculoskeletal: Normal range of motion.  Neurological: She is alert.  Skin: Skin is warm.  Psychiatric: She has a  normal mood and affect.  Nursing note and vitals reviewed.    ED Treatments / Results  Labs (all labs ordered are listed, but only abnormal results are displayed) Labs Reviewed - No data to display  EKG None  Radiology No results found.  Procedures .Marland KitchenIncision and Drainage Date/Time: 09/25/2017 9:21 AM Performed by: Fransico Meadow, PA-C Authorized by: Fransico Meadow, PA-C   Consent:    Consent obtained:  Verbal   Consent given by:  Patient   Risks discussed:  Incomplete drainage   Alternatives discussed:  No treatment Location:    Type:  Abscess   Size:  2   Location:  Head   Head location:  Face Pre-procedure details:    Skin preparation:  Betadine Anesthesia  (see MAR for exact dosages):    Anesthesia method:  Local infiltration   Local anesthetic:  Lidocaine 1% w/o epi Procedure type:    Complexity:  Simple Procedure details:    Needle aspiration: no     Incision types:  Single straight   Incision depth:  Subcutaneous   Scalpel blade:  11   Wound management:  Probed and deloculated and debrided   Drainage amount:  Copious   Wound treatment:  Wound left open   Packing materials:  None Post-procedure details:    Patient tolerance of procedure:  Tolerated well, no immediate complications   (including critical care time)  Medications Ordered in ED Medications  lidocaine (PF) (XYLOCAINE) 1 % injection 5 mL (has no administration in time range)  povidone-iodine (BETADINE) 10 % external solution (has no administration in time range)     Initial Impression / Assessment and Plan / ED Course  I have reviewed the triage vital signs and the nursing notes.  Pertinent labs & imaging results that were available during my care of the patient were reviewed by me and considered in my medical decision making (see chart for details).     Large amount of sebacceous material   Final Clinical Impressions(s) / ED Diagnoses   Final diagnoses:  Abscess    ED Discharge Orders         Ordered    sulfamethoxazole-trimethoprim (BACTRIM DS,SEPTRA DS) 800-160 MG tablet  2 times daily     09/25/17 0918           Fransico Meadow, PA-C 09/25/17 0923    [facility] [employer], MD 09/25/17 2010

## 2017-09-25 NOTE — ED Triage Notes (Signed)
Pt reports abscess to left side of face x 2 days.

## 2017-09-25 NOTE — ED Notes (Addendum)
Left sided facial abscess. No drainage to the area.

## 2017-09-25 NOTE — Discharge Instructions (Signed)
Return if any problems.

## 2021-02-20 ENCOUNTER — Encounter (HOSPITAL_COMMUNITY): Payer: Self-pay | Admitting: *Deleted

## 2021-02-20 ENCOUNTER — Inpatient Hospital Stay (HOSPITAL_COMMUNITY)
Admission: EM | Admit: 2021-02-20 | Discharge: 2021-02-23 | DRG: 638 | Disposition: A | Discharge status: Home / Self Care | Payer: Self-pay | Attending: Internal Medicine | Admitting: Internal Medicine

## 2021-02-20 ENCOUNTER — Emergency Department (HOSPITAL_COMMUNITY): Payer: Self-pay

## 2021-02-20 DIAGNOSIS — Z91199 Patient's noncompliance with other medical treatment and regimen due to unspecified reason: Secondary | ICD-10-CM

## 2021-02-20 DIAGNOSIS — I10 Essential (primary) hypertension: Secondary | ICD-10-CM | POA: Diagnosis present

## 2021-02-20 DIAGNOSIS — F17210 Nicotine dependence, cigarettes, uncomplicated: Secondary | ICD-10-CM | POA: Diagnosis present

## 2021-02-20 DIAGNOSIS — F10239 Alcohol dependence with withdrawal, unspecified: Secondary | ICD-10-CM | POA: Diagnosis present

## 2021-02-20 DIAGNOSIS — Z20822 Contact with and (suspected) exposure to covid-19: Secondary | ICD-10-CM | POA: Diagnosis present

## 2021-02-20 DIAGNOSIS — Z720 Tobacco use: Secondary | ICD-10-CM | POA: Diagnosis present

## 2021-02-20 DIAGNOSIS — E876 Hypokalemia: Secondary | ICD-10-CM | POA: Diagnosis not present

## 2021-02-20 DIAGNOSIS — A599 Trichomoniasis, unspecified: Secondary | ICD-10-CM

## 2021-02-20 DIAGNOSIS — F1910 Other psychoactive substance abuse, uncomplicated: Secondary | ICD-10-CM | POA: Diagnosis present

## 2021-02-20 DIAGNOSIS — E1100 Type 2 diabetes mellitus with hyperosmolarity without nonketotic hyperglycemic-hyperosmolar coma (NKHHC): Principal | ICD-10-CM | POA: Diagnosis present

## 2021-02-20 DIAGNOSIS — I169 Hypertensive crisis, unspecified: Secondary | ICD-10-CM | POA: Diagnosis present

## 2021-02-20 DIAGNOSIS — N289 Disorder of kidney and ureter, unspecified: Secondary | ICD-10-CM

## 2021-02-20 DIAGNOSIS — E875 Hyperkalemia: Secondary | ICD-10-CM

## 2021-02-20 DIAGNOSIS — N179 Acute kidney failure, unspecified: Secondary | ICD-10-CM | POA: Diagnosis present

## 2021-02-20 DIAGNOSIS — E1165 Type 2 diabetes mellitus with hyperglycemia: Secondary | ICD-10-CM | POA: Diagnosis present

## 2021-02-20 DIAGNOSIS — E861 Hypovolemia: Secondary | ICD-10-CM | POA: Diagnosis present

## 2021-02-20 DIAGNOSIS — E871 Hypo-osmolality and hyponatremia: Secondary | ICD-10-CM | POA: Diagnosis present

## 2021-02-20 DIAGNOSIS — F1410 Cocaine abuse, uncomplicated: Secondary | ICD-10-CM | POA: Diagnosis present

## 2021-02-20 DIAGNOSIS — F1010 Alcohol abuse, uncomplicated: Secondary | ICD-10-CM | POA: Diagnosis present

## 2021-02-20 LAB — BLOOD GAS, VENOUS
Acid-Base Excess: 5.5 mmol/L — ABNORMAL HIGH (ref 0.0–2.0)
Bicarbonate: 26.4 mmol/L (ref 20.0–28.0)
Drawn by: 5212
FIO2: 21
O2 Saturation: 41.2 %
Patient temperature: 37.3
pCO2, Ven: 59.7 mmHg (ref 44.0–60.0)
pH, Ven: 7.336 (ref 7.250–7.430)
pO2, Ven: <31 mmHg — CL (ref 32.0–45.0)

## 2021-02-20 LAB — CBC WITH DIFFERENTIAL/PLATELET
Abs Immature Granulocytes: 0.02 10*3/uL (ref 0.00–0.07)
Basophils Absolute: 0 10*3/uL (ref 0.0–0.1)
Basophils Relative: 0 %
Eosinophils Absolute: 0 10*3/uL (ref 0.0–0.5)
Eosinophils Relative: 0 %
HCT: 43.8 % (ref 36.0–46.0)
Hemoglobin: 14.6 g/dL (ref 12.0–15.0)
Immature Granulocytes: 0 %
Lymphocytes Relative: 22 %
Lymphs Abs: 1.1 10*3/uL (ref 0.7–4.0)
MCH: 30.7 pg (ref 26.0–34.0)
MCHC: 33.3 g/dL (ref 30.0–36.0)
MCV: 92.2 fL (ref 80.0–100.0)
Monocytes Absolute: 0.3 10*3/uL (ref 0.1–1.0)
Monocytes Relative: 6 %
Neutro Abs: 3.8 10*3/uL (ref 1.7–7.7)
Neutrophils Relative %: 72 %
Platelets: 387 10*3/uL (ref 150–400)
RBC: 4.75 MIL/uL (ref 3.87–5.11)
RDW: 11.9 % (ref 11.5–15.5)
WBC: 5.3 10*3/uL (ref 4.0–10.5)
nRBC: 0 % (ref 0.0–0.2)

## 2021-02-20 LAB — PREGNANCY, URINE: Preg Test, Ur: NEGATIVE

## 2021-02-20 LAB — CBG MONITORING, ED
Glucose-Capillary: >600 mg/dL (ref 70–99)
Glucose-Capillary: >600 mg/dL (ref 70–99)
Glucose-Capillary: >600 mg/dL (ref 70–99)
Glucose-Capillary: 594 mg/dL (ref 70–99)
Glucose-Capillary: 459 mg/dL — ABNORMAL HIGH (ref 70–99)
Glucose-Capillary: 345 mg/dL — ABNORMAL HIGH (ref 70–99)

## 2021-02-20 LAB — BASIC METABOLIC PANEL
Anion gap: 11 (ref 5–15)
BUN: 18 mg/dL (ref 6–20)
CO2: 27 mmol/L (ref 22–32)
Calcium: 9.8 mg/dL (ref 8.9–10.3)
Chloride: 83 mmol/L — ABNORMAL LOW (ref 98–111)
Creatinine, Ser: 1.44 mg/dL — ABNORMAL HIGH (ref 0.44–1.00)
GFR, Estimated: 45 mL/min — ABNORMAL LOW (ref >60)
Glucose, Bld: 954 mg/dL (ref 70–99)
Potassium: 5.4 mmol/L — ABNORMAL HIGH (ref 3.5–5.1)
Sodium: 121 mmol/L — ABNORMAL LOW (ref 135–145)

## 2021-02-20 LAB — URINALYSIS, ROUTINE W REFLEX MICROSCOPIC
Bilirubin Urine: NEGATIVE
Glucose, UA: >=500 mg/dL — AB
Hgb urine dipstick: NEGATIVE
Ketones, ur: NEGATIVE mg/dL
Leukocytes,Ua: NEGATIVE
Nitrite: NEGATIVE
Specific Gravity, Urine: 1.01 (ref 1.005–1.030)
pH: 7.5 (ref 5.0–8.0)

## 2021-02-20 LAB — BETA-HYDROXYBUTYRIC ACID: Beta-Hydroxybutyric Acid: 0.83 mmol/L — ABNORMAL HIGH (ref 0.05–0.27)

## 2021-02-20 LAB — URINALYSIS, MICROSCOPIC (REFLEX)

## 2021-02-20 LAB — RESP PANEL BY RT-PCR (FLU A&B, COVID) ARPGX2
Influenza A by PCR: NEGATIVE
Influenza B by PCR: NEGATIVE
SARS Coronavirus 2 by RT PCR: NEGATIVE

## 2021-02-20 LAB — RAPID URINE DRUG SCREEN, HOSP PERFORMED
Amphetamines: NOT DETECTED
Barbiturates: NOT DETECTED
Benzodiazepines: NOT DETECTED
Cocaine: POSITIVE — AB
Opiates: NOT DETECTED
Tetrahydrocannabinol: NOT DETECTED

## 2021-02-20 MED ORDER — THIAMINE HCL 100 MG/ML IJ SOLN
100.0000 mg | Freq: Every day | INTRAMUSCULAR | Status: DC
Start: 2021-02-20 — End: 2021-02-22
  Administered 2021-02-20 – 2021-02-21 (×2): 100 mg via INTRAVENOUS
  Filled 2021-02-20 (×2): qty 2

## 2021-02-20 MED ORDER — FOLIC ACID 1 MG PO TABS: 1.0000 mg | ORAL_TABLET | Freq: Every day | ORAL | Status: DC

## 2021-02-20 MED ORDER — LACTATED RINGERS IV BOLUS
1000.0000 mL | Freq: Once | INTRAVENOUS | Status: AC
  Administered 2021-02-21: 1000 mL via INTRAVENOUS

## 2021-02-20 MED ORDER — THIAMINE HCL 100 MG PO TABS
100.0000 mg | ORAL_TABLET | Freq: Every day | ORAL | Status: DC
Start: 2021-02-20 — End: 2021-02-22
  Administered 2021-02-22: 100 mg via ORAL
  Filled 2021-02-20: qty 1

## 2021-02-20 MED ORDER — HEPARIN SODIUM (PORCINE) 5000 UNIT/ML IJ SOLN
5000.0000 [IU] | Freq: Three times a day (TID) | INTRAMUSCULAR | Status: DC
  Administered 2021-02-21 – 2021-02-23 (×7): 5000 [IU] via SUBCUTANEOUS
  Filled 2021-02-20 (×7): qty 1

## 2021-02-20 MED ORDER — DEXTROSE IN LACTATED RINGERS 5 % IV SOLN: INTRAVENOUS | Status: DC

## 2021-02-20 MED ORDER — ONDANSETRON HCL 4 MG/2ML IJ SOLN
4.0000 mg | Freq: Four times a day (QID) | INTRAMUSCULAR | Status: DC | PRN
  Administered 2021-02-20: 4 mg via INTRAVENOUS
  Filled 2021-02-20: qty 2

## 2021-02-20 MED ORDER — DEXTROSE 50 % IV SOLN: 0.0000 mL | INTRAVENOUS | Status: DC | PRN

## 2021-02-20 MED ORDER — ADULT MULTIVITAMIN W/MINERALS CH
1.0000 | ORAL_TABLET | Freq: Every day | ORAL | Status: DC
  Administered 2021-02-22: 1 via ORAL
  Filled 2021-02-20: qty 1

## 2021-02-20 MED ORDER — HYDRALAZINE HCL 20 MG/ML IJ SOLN
20.0000 mg | Freq: Once | INTRAMUSCULAR | Status: AC
  Administered 2021-02-20: 20 mg via INTRAVENOUS
  Filled 2021-02-20: qty 1

## 2021-02-20 MED ORDER — METRONIDAZOLE 500 MG PO TABS: 2000.0000 mg | ORAL_TABLET | Freq: Once | ORAL | Status: DC

## 2021-02-20 MED ORDER — SODIUM CHLORIDE 0.9 % IV BOLUS
1000.0000 mL | Freq: Once | INTRAVENOUS | Status: AC
Start: 2021-02-20 — End: 2021-02-20
  Administered 2021-02-20: 1000 mL via INTRAVENOUS

## 2021-02-20 MED ORDER — LACTATED RINGERS IV SOLN: INTRAVENOUS | Status: DC

## 2021-02-20 MED ORDER — LORAZEPAM 1 MG PO TABS: 1.0000 mg | ORAL_TABLET | ORAL | Status: DC | PRN

## 2021-02-20 MED ORDER — INSULIN REGULAR(HUMAN) IN NACL 100-0.9 UT/100ML-% IV SOLN
INTRAVENOUS | Status: DC
  Administered 2021-02-20: 7.5 [IU]/h via INTRAVENOUS
  Administered 2021-02-21: 1.4 [IU]/h via INTRAVENOUS
  Filled 2021-02-20 (×2): qty 100

## 2021-02-20 MED ORDER — LORAZEPAM 2 MG/ML IJ SOLN
1.0000 mg | INTRAMUSCULAR | Status: DC | PRN
  Administered 2021-02-20: 2 mg via INTRAVENOUS
  Administered 2021-02-21: 4 mg via INTRAVENOUS
  Filled 2021-02-20: qty 2
  Filled 2021-02-20: qty 1

## 2021-02-20 MED ORDER — LACTATED RINGERS IV BOLUS
20.0000 mL/kg | Freq: Once | INTRAVENOUS | Status: AC
  Administered 2021-02-20: 1418 mL via INTRAVENOUS

## 2021-02-20 MED ORDER — NITROGLYCERIN 2 % TD OINT
1.0000 [in_us] | TOPICAL_OINTMENT | Freq: Once | TRANSDERMAL | Status: AC
  Administered 2021-02-20: 1 [in_us] via TOPICAL
  Filled 2021-02-20: qty 1

## 2021-02-20 MED ORDER — CHLORHEXIDINE GLUCONATE CLOTH 2 % EX PADS
6.0000 | MEDICATED_PAD | Freq: Every day | CUTANEOUS | Status: DC
  Administered 2021-02-21 – 2021-02-22 (×2): 6 via TOPICAL

## 2021-02-20 NOTE — Assessment & Plan Note (Addendum)
-  TOC consulted to help with any barriers of access to medicine to make sure patient has primary care provider at discharge for follow-up.

## 2021-02-20 NOTE — Assessment & Plan Note (Addendum)
-  Patient with hyperosmolar non ketotic acidosis at time of admission.  -Hyperglycemia at 954 on presentation. -Secondary to medication noncompliance and use of recreational drugs (UDS positive for cocaine). -Excellent response to insulin drip, IV fluids and n.p.o. status. -Transitioned to SSI and long acting insulin -Prior to admission patient was supposed to be on 25 units of insulin (Lantus) and metformin. -Stressed importance of glycemic control with insulin and checking her blood sugars consistently

## 2021-02-20 NOTE — H&P (Addendum)
History and Physical    Patient: Megan Mullins ZCH:[id number] DOB: July 09, 1970 DOA: 02/20/2021 DOS: the patient was seen and examined on 02/20/2021 PCP: Patient, No Pcp Per (Inactive)  Patient coming from: Home  Chief Complaint:  Chief Complaint  Patient presents with   Hyperglycemia    HPI: Megan Mullins is a 51 y.o. female with medical history significant of history of diabetes mellitus, polysubstance abuse, presents to ED with a chief complaint of "I felt like my sugar was high."  Patient reports that she has had polyuria polydipsia for approximately 3 days.  Its been constant over that time period, and has not gotten worse.  She reports associated fatigue.  She has no body aches, chest pain, palpitations, shortness of breath, diarrhea, dizziness, lightheadedness.  She reports she has not been checking her glucose.  The last time she took her insulin was approximately 1 month ago.  Patient is very flippant about trying to discuss why she has not taken her insulin or checked her glucose.  May be barriers with access to medication?  Patient has no other complaints at this time.  Patient does smoke but does not want a nicotine patch, drinks alcohol with her last drink being 1 week ago.  Prior to that she was having about 80 ounces of beer per day per her report.  She denies any withdrawal symptoms.  Patient also uses cocaine.  Her last cocaine use was yesterday.  This explains her blood pressure of 193/132.  Hydralazine given in the ED.  Patient is vaccinated for COVID.  Patient is full code.  Review of Systems: As mentioned in the history of present illness. All other systems reviewed and are negative. Past Medical History:  Diagnosis Date   Diabetes mellitus without complication ([place])    Past Surgical History:  Procedure Laterality Date   TUBAL LIGATION     Social History:  reports that she has been smoking cigarettes. She has been smoking an average of .5 packs per day. She has never used  smokeless tobacco. She reports current alcohol use. She reports current drug use. Drug: Marijuana.  No Known Allergies  No family history on file.  Prior to Admission medications   Medication Sig Start Date End Date Taking? Authorizing Provider  blood glucose meter kit and supplies KIT To check CBG every day before breakfast and before dinner. 07/10/15   Rexene Alberts, MD  cephALEXin (KEFLEX) 500 MG capsule Take 1 capsule (500 mg total) by mouth 4 (four) times daily. 08/21/17   Mesner, Corene Cornea, MD  insulin NPH Human (HUMULIN N,NOVOLIN N) 100 UNIT/ML injection Inject 0.25 mLs (25 Units total) into the skin 2 (two) times daily at 8 am and 10 pm. 08/21/17   Mesner, Corene Cornea, MD  Insulin Syringes, Disposable, U-100 1 ML MISC Use 2 times daily with NPH insulin 07/10/15   Rexene Alberts, MD  metFORMIN (GLUCOPHAGE) 1000 MG tablet Take 1 tablet (1,000 mg total) by mouth 2 (two) times daily with a meal. 07/10/15   Rexene Alberts, MD    Physical Exam: Vitals:   02/20/21 1821 02/20/21 1831 02/20/21 1900 02/20/21 2000  BP:  (!) 190/113 (!) 191/120 (!) 193/132  Pulse:  (!) 53  (!) 102  Resp:  11 19 (!) 33  Temp:  99.1 F (37.3 C)    TempSrc:  Oral    SpO2:  98%  97%  Weight: 70.9 kg      1.  General: Patient lying supine in bed,  no  acute distress   2. Psychiatric: Alert and oriented x 3, irritable and and flippant, cooperative with exam   3. Neurologic: Speech and language are normal, face is symmetric, moves all 4 extremities voluntarily, at baseline without acute deficits on limited exam   4. HEENMT:  Head is atraumatic, normocephalic, pupils reactive to light, neck is supple, trachea is midline, mucous membranes are dry   5. Respiratory : Lungs are clear to auscultation bilaterally without wheezing, rhonchi, rales, no cyanosis, no increase in work of breathing or accessory muscle use   6. Cardiovascular : Heart rate tachycardic, rhythm is regular, no murmurs, rubs or gallops, no peripheral  edema, peripheral pulses palpated   7. Gastrointestinal:  Abdomen is soft, nondistended, nontender to palpation bowel sounds active, no masses or organomegaly palpated   8. Skin:  Skin is warm, dry and intact without rashes, acute lesions, or ulcers on limited exam   9.Musculoskeletal:  No acute deformities or trauma, no asymmetry in tone, no peripheral edema, peripheral pulses palpated, no tenderness to palpation in the extremities   Data Reviewed: In the ED Temp 99.1, heart rate 53-1 02, respiratory rate 11-33, blood pressure 193/132 Maintaining oxygen sats on room air Patient has no leukocytosis, hemoglobin is stable but likely hemoconcentrated as well at 14.6 Chemistry panel reveals a pseudohyponatremia at 121 that corrects at 141 Glucose 954 Potassium 5.4, bicarb 27 Patient has elevated creatinine at 1.44, it appears that her well baseline is 0.7 UA shows trichomoniasis Hydralazine 20 mg IV given in the ED Insulin drip started 1 L normal saline LR 125 maintenance   Assessment and Plan: * Hyperglycemic crisis in diabetes mellitus ([place])- (present on admission) Hyperglycemia at 954 secondary to medical noncompliance pH 7.3, gap 9.8, bicarb 27 Continue insulin drip Consult diabetes coordinator Discussed importance of medical compliance with patient N.p.o. Hyperglycemic order set utilized -At baseline patient is supposed to use 25 units of insulin nightly and metformin daily Monitor on stepdown  Hypertensive crisis- (present on admission) Patient's blood pressure is 193/132 No headache, dizziness, lightheadedness, chest pain Secondary to cocaine use -last use yesterday May have underlying hypertension as well?? Avoiding beta-blockers given recent cocaine use 20 mg IV hydralazine given in the ED Continue to monitor  Trichomonas infection Given patient history of noncompliance single dose metronidazole treatment was selected 2 g Flagyl ordered Patient would likely  need outpatient test of cure  AKI (acute kidney injury) ([place])- (present on admission) Creatinine baseline 0.7, today 1.44 Secondary to hypovolemia associated with hyperglycemic crisis 1 L NaCl given in the ED, will continue LR 125 Hypertensive crisis is also likely contributing to the blood pressure 192/132 -see above  Alcohol abuse- (present on admission) Patient is within withdrawal window as last drink was 1 week ago CIWA protocol Continue to monitor  Substance abuse ([place])- (present on admission) Patient reports last use of cocaine was yesterday UDS pending Counseled on importance of cessation TOC consult  History of noncompliance with medical treatment TOC consulted to help with any barriers of access to medicine  Tobacco abuse- (present on admission) Patient was counseled on the importance of smoking cessation She declines nicotine patch at this time       Advance Care Planning:   Code Status: Prior full  Consults: Diabetes coordinator and TOC  Family Communication: No family at bedside  Severity of Illness: The appropriate patient status for this patient is OBSERVATION. Observation status is judged to be reasonable and necessary in order to provide the required intensity  of service to ensure the patient's safety. The patient's presenting symptoms, physical exam findings, and initial radiographic and laboratory data in the context of their medical condition is felt to place them at decreased risk for further clinical deterioration. Furthermore, it is anticipated that the patient will be medically stable for discharge from the hospital within 2 midnights of admission.   Author: Rolla Plate, DO 02/20/2021 8:41 PM  For on call review [email or web address].

## 2021-02-20 NOTE — Assessment & Plan Note (Addendum)
-  Continue thiamine and folic acid -Cessation counseling provided

## 2021-02-20 NOTE — ED Provider Notes (Signed)
[facility] EMERGENCY DEPARTMENT Provider Note   CSN: [id number] Arrival date & time: 02/20/21  1811     History  Chief Complaint  Patient presents with   Hyperglycemia    Megan Mullins is a 51 y.o. female.   Hyperglycemia  This patient is a 51 year old female, she has a history of diabetes, the patient has history of noncompliance with her medications as well.  She denies having history of hypertension.  She reports that has been a couple of months since she has last had her medications, states that she does not have a local doctor, has never been diagnosed with hypertension, she has been having progressive feeling of dryness of the mouth, excessive thirst, excessive urination and has had approximately 70 pound weight loss  No nausea vomiting or diarrhea, minimal headache, no blurred vision, no fevers or chills, no coughing or shortness of breath.  Home Medications Prior to Admission medications   Medication Sig Start Date End Date Taking? Authorizing Provider  blood glucose meter kit and supplies KIT To check CBG every day before breakfast and before dinner. 07/10/15   Rexene Alberts, MD  cephALEXin (KEFLEX) 500 MG capsule Take 1 capsule (500 mg total) by mouth 4 (four) times daily. 08/21/17   Mesner, Corene Cornea, MD  insulin NPH Human (HUMULIN N,NOVOLIN N) 100 UNIT/ML injection Inject 0.25 mLs (25 Units total) into the skin 2 (two) times daily at 8 am and 10 pm. 08/21/17   Mesner, Corene Cornea, MD  Insulin Syringes, Disposable, U-100 1 ML MISC Use 2 times daily with NPH insulin 07/10/15   Rexene Alberts, MD  metFORMIN (GLUCOPHAGE) 1000 MG tablet Take 1 tablet (1,000 mg total) by mouth 2 (two) times daily with a meal. 07/10/15   Rexene Alberts, MD      Allergies    Patient has no known allergies.    Review of Systems   Review of Systems  All other systems reviewed and are negative.  Physical Exam Updated Vital Signs BP (!) 193/132    Pulse (!) 102    Temp 99.1 F (37.3 C) (Oral)    Resp (!)  33    Wt 70.9 kg    LMP 01/30/2021 (Within Weeks) Comment: stated LMP a few weeks ago   SpO2 97%    BMI 29.53 kg/m  Physical Exam Vitals and nursing note reviewed.  Constitutional:      General: She is not in acute distress.    Appearance: She is well-developed.  HENT:     Head: Normocephalic and atraumatic.     Mouth/Throat:     Pharynx: No oropharyngeal exudate.  Eyes:     General: No scleral icterus.       Right eye: No discharge.        Left eye: No discharge.     Conjunctiva/sclera: Conjunctivae normal.     Pupils: Pupils are equal, round, and reactive to light.  Neck:     Thyroid: No thyromegaly.     Vascular: No JVD.  Cardiovascular:     Rate and Rhythm: Regular rhythm. Tachycardia present.     Heart sounds: Normal heart sounds. No murmur heard.   No friction rub. No gallop.  Pulmonary:     Effort: Pulmonary effort is normal. No respiratory distress.     Breath sounds: Normal breath sounds. No wheezing or rales.  Abdominal:     General: Bowel sounds are normal. There is no distension.     Palpations: Abdomen is soft. There is no  mass.     Tenderness: There is no abdominal tenderness.  Musculoskeletal:        General: No tenderness. Normal range of motion.     Cervical back: Normal range of motion and neck supple.  Lymphadenopathy:     Cervical: No cervical adenopathy.  Skin:    General: Skin is warm and dry.     Findings: No erythema or rash.  Neurological:     General: No focal deficit present.     Mental Status: She is alert.     Coordination: Coordination normal.  Psychiatric:        Behavior: Behavior normal.    ED Results / Procedures / Treatments   Labs (all labs ordered are listed, but only abnormal results are displayed) Labs Reviewed  BASIC METABOLIC PANEL - Abnormal; Notable for the following components:      Result Value   Sodium 121 (*)    Potassium 5.4 (*)    Chloride 83 (*)    Glucose, Bld 954 (*)    Creatinine, Ser 1.44 (*)    GFR,  Estimated 45 (*)    All other components within normal limits  BETA-HYDROXYBUTYRIC ACID - Abnormal; Notable for the following components:   Beta-Hydroxybutyric Acid 0.83 (*)    All other components within normal limits  URINALYSIS, ROUTINE W REFLEX MICROSCOPIC - Abnormal; Notable for the following components:   Glucose, UA >=500 (*)    Protein, ur TRACE (*)    All other components within normal limits  BLOOD GAS, VENOUS - Abnormal; Notable for the following components:   pO2, Ven <31.0 (*)    Acid-Base Excess 5.5 (*)    All other components within normal limits  URINALYSIS, MICROSCOPIC (REFLEX) - Abnormal; Notable for the following components:   Bacteria, UA FEW (*)    Trichomonas, UA PRESENT (*)    All other components within normal limits  CBG MONITORING, ED - Abnormal; Notable for the following components:   Glucose-Capillary >600 (*)    All other components within normal limits  CBG MONITORING, ED - Abnormal; Notable for the following components:   Glucose-Capillary >600 (*)    All other components within normal limits  CBG MONITORING, ED - Abnormal; Notable for the following components:   Glucose-Capillary >600 (*)    All other components within normal limits  RESP PANEL BY RT-PCR (FLU A&B, COVID) ARPGX2  CBC WITH DIFFERENTIAL/PLATELET  BASIC METABOLIC PANEL  BASIC METABOLIC PANEL  BASIC METABOLIC PANEL  BETA-HYDROXYBUTYRIC ACID  PREGNANCY, URINE  RAPID URINE DRUG SCREEN, HOSP PERFORMED    EKG None  Radiology DG Chest Portable 1 View  Result Date: 02/20/2021 CLINICAL DATA:  Hyperglycemia, diabetic ketoacidosis EXAM: PORTABLE CHEST 1 VIEW COMPARISON:  03/13/2014 FINDINGS: Lungs are clear. No pneumothorax or pleural effusion. Cardiac size within normal limits. Pulmonary vascularity is normal. No acute bone abnormality. Rounded ossific density subjacent to the coracoid process may represent intra-articular loose bodies within the right shoulder. IMPRESSION: No radiographic  evidence of acute cardiopulmonary disease. Electronically Signed   By: Fidela Salisbury M.D.   On: 02/20/2021 19:38    Procedures .Critical Care Performed by: Noemi Chapel, MD Authorized by: Noemi Chapel, MD   Critical care provider statement:    Critical care time (minutes):  30   Critical care time was exclusive of:  Separately billable procedures and treating other patients and teaching time   Critical care was necessary to treat or prevent imminent or life-threatening deterioration of the following conditions:  Metabolic crisis and endocrine crisis   Critical care was time spent personally by me on the following activities:  Development of treatment plan with patient or surrogate, discussions with consultants, evaluation of patient's response to treatment, examination of patient, ordering and review of laboratory studies, ordering and review of radiographic studies, ordering and performing treatments and interventions, pulse oximetry, re-evaluation of patient's condition, review of old charts and obtaining history from patient or surrogate   I assumed direction of critical care for this patient from another provider in my specialty: no     Care discussed with: admitting provider   Comments:          Medications Ordered in ED Medications  insulin regular, human (MYXREDLIN) 100 units/ 100 mL infusion (7.5 Units/hr Intravenous New Bag/Given 02/20/21 1957)  lactated ringers infusion ( Intravenous New Bag/Given 02/20/21 1958)  dextrose 5 % in lactated ringers infusion (has no administration in time range)  dextrose 50 % solution 0-50 mL (has no administration in time range)  lactated ringers bolus 1,418 mL (1,418 mLs Intravenous Bolus 02/20/21 1841)  sodium chloride 0.9 % bolus 1,000 mL (1,000 mLs Intravenous New Bag/Given 02/20/21 2009)  hydrALAZINE (APRESOLINE) injection 20 mg (20 mg Intravenous Given 02/20/21 2023)    ED Course/ Medical Decision Making/ A&P                           Medical  Decision Making Amount and/or Complexity of Data Reviewed Labs: ordered. Radiology: ordered.  Risk Prescription drug management. Decision regarding hospitalization.   This patient presents to the ED for concern of hyperglycemia which I suspect is going to be DKA, this involves an extensive number of treatment options, and is a complaint that carries with it a high risk of complications and morbidity.  The differential diagnosis includes medication noncompliance, possibly underlying infection but most certainly noncompliance leads the differential.  With excessive weight loss, urinary frequency and excessive thirst it makes sense   Co morbidities that complicate the patient evaluation  Noncompliance, obesity, diabetes   Additional history obtained:  Additional history obtained from electronic medical External records from outside source obtained and reviewed including multiple ER visits for diabetic related complications although it has been a couple of years.  She has been admitted to the hospital in 2017, 2016 and prior because of recurrent hyperglycemic crisis.   Lab Tests:  I Ordered, and personally interpreted labs.  The pertinent results include: CBC, metabolic panel, urinalysis, serum ketones, VBG, urinalysis reveals glucosuria without ketones, trichomonas present, pH on VBG is unremarkable at 7.336, glucose of 954, creatinine of 1.44, both of which are higher than baseline.  Potassium was 5.4, sodium was 121, beta hydroxybutyric acid was found, CBC without leukocytosis.     Imaging Studies ordered:  I ordered imaging studies including Portable CXR  I independently visualized and interpreted imaging which showed no acute findings. I agree with the radiologist interpretation   Cardiac Monitoring:  The patient was maintained on a cardiac monitor.  I personally viewed and interpreted the cardiac monitored which showed an underlying rhythm of: sinus tachycardia   Medicines  ordered and prescription drug management:  I ordered medication including insulin with drip  for severe hyperglycemia Reevaluation of the patient after these medicines showed that the patient improved I have reviewed the patients home medicines and have made adjustments as needed   Test Considered:  CT scan of the brain but at this time the patient is  not having any vomiting, she is neurologically intact, her blood pressure has been treated with hydralazine, should be coming down   Critical Interventions:  Insulin drip Intravenous antihypertensives Admission to higher level of care   Consultations Obtained:  I requested consultation with the hospitalist,  and discussed lab and imaging findings as well as pertinent plan - they recommend: Admission to the hospital, continue insulin drip   Problem List / ED Course:  Severe hyperglycemia, borderline DKA, severe hyponatremia, hyperkalemia, acute renal insufficiency.  IV fluids, multiple boluses, insulin drip given.   Reevaluation:  After the interventions noted above, I reevaluated the patient and found that they have :improved   Social Determinants of Health:  Noncompliant with medications, no access to healthcare   Dispostion:  After consideration of the diagnostic results and the patients response to treatment, I feel that the patent would benefit from admission to the hospital to higher level of care.          Final Clinical Impression(s) / ED Diagnoses Final diagnoses:  Severe hyperglycemia due to diabetes mellitus ([place])  Primary hypertension  Hyponatremia  Renal insufficiency     Noemi Chapel, MD 02/20/21 2044

## 2021-02-20 NOTE — Assessment & Plan Note (Addendum)
-  Creatinine baseline 0.7 -At time of admission creatinine peaked at 1.44. -Appears to be in the setting of prerenal azotemia and dehydration from ongoing hyperosmolar nonketotic acidosis. -Excellent response to aggressive fluid resuscitation -Renal function is now back to normal. -Continue to follow electrolytes and renal function trend.

## 2021-02-20 NOTE — Assessment & Plan Note (Addendum)
-  Patient actively using cocaine, tobacco abuse and alcohol -Cessation counseling provided -TOC has been made aware to provide resources as an outpatient to assist patient with quitting process.

## 2021-02-20 NOTE — ED Notes (Signed)
Pt in gown and on monitor 

## 2021-02-20 NOTE — Assessment & Plan Note (Addendum)
-  Given patient history of noncompliance single dose metronidazole treatment was selected -Patient received 2 g Flagyl x1 dose.

## 2021-02-20 NOTE — ED Triage Notes (Signed)
States her sugar is up, has not taken insulin in a while

## 2021-02-20 NOTE — Assessment & Plan Note (Addendum)
-  No endorgan damage appreciated

## 2021-02-20 NOTE — Assessment & Plan Note (Addendum)
-  Cessation counseling provided -patient has declined the use of nicotine patch.

## 2021-02-21 DIAGNOSIS — N179 Acute kidney failure, unspecified: Secondary | ICD-10-CM

## 2021-02-21 DIAGNOSIS — A599 Trichomoniasis, unspecified: Secondary | ICD-10-CM

## 2021-02-21 DIAGNOSIS — Z91199 Patient's noncompliance with other medical treatment and regimen due to unspecified reason: Secondary | ICD-10-CM

## 2021-02-21 DIAGNOSIS — E875 Hyperkalemia: Secondary | ICD-10-CM

## 2021-02-21 LAB — BASIC METABOLIC PANEL
Anion gap: 10 (ref 5–15)
Anion gap: 11 (ref 5–15)
Anion gap: 6 (ref 5–15)
Anion gap: 9 (ref 5–15)
BUN: 10 mg/dL (ref 6–20)
BUN: 11 mg/dL (ref 6–20)
BUN: 12 mg/dL (ref 6–20)
BUN: 15 mg/dL (ref 6–20)
CO2: 27 mmol/L (ref 22–32)
CO2: 28 mmol/L (ref 22–32)
CO2: 29 mmol/L (ref 22–32)
CO2: 29 mmol/L (ref 22–32)
Calcium: 9.3 mg/dL (ref 8.9–10.3)
Calcium: 9.3 mg/dL (ref 8.9–10.3)
Calcium: 9.3 mg/dL (ref 8.9–10.3)
Calcium: 9.7 mg/dL (ref 8.9–10.3)
Chloride: 101 mmol/L (ref 98–111)
Chloride: 94 mmol/L — ABNORMAL LOW (ref 98–111)
Chloride: 97 mmol/L — ABNORMAL LOW (ref 98–111)
Chloride: 98 mmol/L (ref 98–111)
Creatinine, Ser: 0.88 mg/dL (ref 0.44–1.00)
Creatinine, Ser: 0.98 mg/dL (ref 0.44–1.00)
Creatinine, Ser: 1.05 mg/dL — ABNORMAL HIGH (ref 0.44–1.00)
Creatinine, Ser: 1.17 mg/dL — ABNORMAL HIGH (ref 0.44–1.00)
GFR, Estimated: 58 mL/min — ABNORMAL LOW (ref >60)
GFR, Estimated: >60 mL/min (ref >60)
GFR, Estimated: >60 mL/min (ref >60)
GFR, Estimated: >60 mL/min (ref >60)
Glucose, Bld: 185 mg/dL — ABNORMAL HIGH (ref 70–99)
Glucose, Bld: 223 mg/dL — ABNORMAL HIGH (ref 70–99)
Glucose, Bld: 228 mg/dL — ABNORMAL HIGH (ref 70–99)
Glucose, Bld: 382 mg/dL — ABNORMAL HIGH (ref 70–99)
Potassium: 3.4 mmol/L — ABNORMAL LOW (ref 3.5–5.1)
Potassium: 3.7 mmol/L (ref 3.5–5.1)
Potassium: 3.9 mmol/L (ref 3.5–5.1)
Potassium: 4 mmol/L (ref 3.5–5.1)
Sodium: 133 mmol/L — ABNORMAL LOW (ref 135–145)
Sodium: 134 mmol/L — ABNORMAL LOW (ref 135–145)
Sodium: 136 mmol/L (ref 135–145)
Sodium: 136 mmol/L (ref 135–145)

## 2021-02-21 LAB — CBC
HCT: 37.5 % (ref 36.0–46.0)
Hemoglobin: 12.7 g/dL (ref 12.0–15.0)
MCH: 31.1 pg (ref 26.0–34.0)
MCHC: 33.9 g/dL (ref 30.0–36.0)
MCV: 91.9 fL (ref 80.0–100.0)
Platelets: 348 10*3/uL (ref 150–400)
RBC: 4.08 MIL/uL (ref 3.87–5.11)
RDW: 11.9 % (ref 11.5–15.5)
WBC: 9.7 10*3/uL (ref 4.0–10.5)
nRBC: 0 % (ref 0.0–0.2)

## 2021-02-21 LAB — BLOOD GAS, VENOUS
Acid-Base Excess: 5.6 mmol/L — ABNORMAL HIGH (ref 0.0–2.0)
Bicarbonate: 27.7 mmol/L (ref 20.0–28.0)
Drawn by: 5212
FIO2: 21
O2 Saturation: 56.3 %
Patient temperature: 37.3
pCO2, Ven: 50.6 mmHg (ref 44.0–60.0)
pH, Ven: 7.396 (ref 7.250–7.430)
pO2, Ven: 35.7 mmHg (ref 32.0–45.0)

## 2021-02-21 LAB — GLUCOSE, CAPILLARY
Glucose-Capillary: 118 mg/dL — ABNORMAL HIGH (ref 70–99)
Glucose-Capillary: 148 mg/dL — ABNORMAL HIGH (ref 70–99)
Glucose-Capillary: 155 mg/dL — ABNORMAL HIGH (ref 70–99)
Glucose-Capillary: 172 mg/dL — ABNORMAL HIGH (ref 70–99)
Glucose-Capillary: 177 mg/dL — ABNORMAL HIGH (ref 70–99)
Glucose-Capillary: 177 mg/dL — ABNORMAL HIGH (ref 70–99)
Glucose-Capillary: 186 mg/dL — ABNORMAL HIGH (ref 70–99)
Glucose-Capillary: 186 mg/dL — ABNORMAL HIGH (ref 70–99)
Glucose-Capillary: 193 mg/dL — ABNORMAL HIGH (ref 70–99)
Glucose-Capillary: 203 mg/dL — ABNORMAL HIGH (ref 70–99)
Glucose-Capillary: 229 mg/dL — ABNORMAL HIGH (ref 70–99)
Glucose-Capillary: 230 mg/dL — ABNORMAL HIGH (ref 70–99)
Glucose-Capillary: 251 mg/dL — ABNORMAL HIGH (ref 70–99)
Glucose-Capillary: 257 mg/dL — ABNORMAL HIGH (ref 70–99)
Glucose-Capillary: 273 mg/dL — ABNORMAL HIGH (ref 70–99)
Glucose-Capillary: 357 mg/dL — ABNORMAL HIGH (ref 70–99)

## 2021-02-21 LAB — MAGNESIUM
Magnesium: 1.8 mg/dL (ref 1.7–2.4)
Magnesium: 1.7 mg/dL (ref 1.7–2.4)

## 2021-02-21 LAB — HIV ANTIBODY (ROUTINE TESTING W REFLEX): HIV Screen 4th Generation wRfx: NONREACTIVE

## 2021-02-21 LAB — OSMOLALITY: Osmolality: 305 mOsm/kg — ABNORMAL HIGH (ref 275–295)

## 2021-02-21 LAB — MRSA NEXT GEN BY PCR, NASAL: MRSA by PCR Next Gen: NOT DETECTED

## 2021-02-21 LAB — BETA-HYDROXYBUTYRIC ACID
Beta-Hydroxybutyric Acid: 0.14 mmol/L (ref 0.05–0.27)
Beta-Hydroxybutyric Acid: 0.15 mmol/L (ref 0.05–0.27)

## 2021-02-21 MED ORDER — INSULIN ASPART 100 UNIT/ML IJ SOLN
0.0000 [IU] | Freq: Three times a day (TID) | INTRAMUSCULAR | Status: DC
  Administered 2021-02-21: 5 [IU] via SUBCUTANEOUS
  Administered 2021-02-22: 15 [IU] via SUBCUTANEOUS
  Administered 2021-02-22 (×2): 5 [IU] via SUBCUTANEOUS
  Administered 2021-02-23: 8 [IU] via SUBCUTANEOUS
  Administered 2021-02-23: 11 [IU] via SUBCUTANEOUS

## 2021-02-21 MED ORDER — HYDRALAZINE HCL 20 MG/ML IJ SOLN
10.0000 mg | Freq: Four times a day (QID) | INTRAMUSCULAR | Status: DC | PRN
  Administered 2021-02-21: 10 mg via INTRAVENOUS
  Filled 2021-02-21: qty 1

## 2021-02-21 MED ORDER — INSULIN DETEMIR 100 UNIT/ML ~~LOC~~ SOLN
6.0000 [IU] | Freq: Two times a day (BID) | SUBCUTANEOUS | Status: DC
  Administered 2021-02-21 – 2021-02-22 (×2): 6 [IU] via SUBCUTANEOUS
  Filled 2021-02-21 (×4): qty 0.06

## 2021-02-21 MED ORDER — FOLIC ACID 5 MG/ML IJ SOLN
1.0000 mg | Freq: Every day | INTRAMUSCULAR | Status: DC
  Administered 2021-02-21 – 2021-02-23 (×3): 1 mg via INTRAVENOUS
  Filled 2021-02-21 (×3): qty 0.2

## 2021-02-21 MED ORDER — HALOPERIDOL LACTATE 5 MG/ML IJ SOLN
5.0000 mg | Freq: Four times a day (QID) | INTRAMUSCULAR | Status: DC | PRN
  Administered 2021-02-21: 5 mg via INTRAVENOUS
  Filled 2021-02-21: qty 1

## 2021-02-21 MED ORDER — LOSARTAN POTASSIUM 25 MG PO TABS
25.0000 mg | ORAL_TABLET | Freq: Every day | ORAL | Status: DC
  Administered 2021-02-21 – 2021-02-22 (×2): 25 mg via ORAL
  Filled 2021-02-21 (×3): qty 1

## 2021-02-21 MED ORDER — HALOPERIDOL LACTATE 5 MG/ML IJ SOLN: 2.0000 mg | Freq: Three times a day (TID) | INTRAMUSCULAR | Status: DC | PRN

## 2021-02-21 MED ORDER — INSULIN ASPART 100 UNIT/ML IJ SOLN: 0.0000 [IU] | Freq: Every day | INTRAMUSCULAR | Status: DC

## 2021-02-21 NOTE — Progress Notes (Signed)
Progress Note   Patient: Megan Mullins IBB:[id number] DOB: [birth year] DOA: 02/20/2021     0 DOS: the patient was seen and examined on 02/21/2021   Brief hospital admission narrative: As per H&P written by Dr. Carren Rang on 02/20/21 Megan Mullins is a 51 y.o. female with medical history significant of history of diabetes mellitus, polysubstance abuse, presents to ED with a chief complaint of "I felt like my sugar was high."  Patient reports that she has had polyuria polydipsia for approximately 3 days.  Its been constant over that time period, and has not gotten worse.  She reports associated fatigue.  She has no body aches, chest pain, palpitations, shortness of breath, diarrhea, dizziness, lightheadedness.  She reports she has not been checking her glucose.  The last time she took her insulin was approximately 1 month ago.  Patient is very flippant about trying to discuss why she has not taken her insulin or checked her glucose.  May be barriers with access to medication?  Patient has no other complaints at this time.   Patient does smoke but does not want a nicotine patch, drinks alcohol with her last drink being 1 week ago.  Prior to that she was having about 80 ounces of beer per day per her report.  She denies any withdrawal symptoms.  Patient also uses cocaine.  Her last cocaine use was yesterday.  This explains her blood pressure of 193/132.  Hydralazine given in the ED.  Patient is vaccinated for COVID.  Patient is full code.    Assessment and Plan: * Hyperglycemic crisis in diabetes mellitus (HCC)- (present on admission) -Hyperglycemia at 954 at time of admission  -secondary to medication noncompliance -Excellent response to insulin drip, IV fluids and n.p.o. status. -Patient will be transition off insulin and sliding sliding scale insulin and Levemir every 12 hours. -Modified carbohydrate diet will be ordered -Continue to follow CBGs to further adjust hypoglycemia regimen. -Prior to  admission patient was supposed to be on 25 units of insulin (Lantus) and metformin.   Hyperkalemia - Borderline hyperkalemia at time of admission; with transient hypokalemia throughout treatment with insulin drip. -Potassium is currently stable and within normal limits; K 3.9 -Continue to follow trend.  Trichomonas infection -Given patient history of noncompliance single dose metronidazole treatment was selected -Patient received 2 g Flagyl x1 dose.   AKI (acute kidney injury) (HCC)- (present on admission) -Creatinine baseline 0.7 -At time of admission creatinine peak at 1.44. -Appears to be in the setting of prerenal azotemia and dehydration from ongoing hyperosmolar nonketotic acidosis. -Excellent response to aggressive fluid resuscitation -Renal function is now back to normal. -Continue to follow electrolytes and renal function trend.  Alcohol abuse- (present on admission) -Continue CIWA protocol -Continue thiamine and folic acid -Cessation counseling .  Hypertensive crisis- (present on admission) -No endorgan damage is appreciated -Ongoing outpatient high blood pressure in the setting of withdrawal elevated likely underlying hypertension -Continue the use of hydralazine -will start losartan -Follow-up vital signs.  Substance abuse (HCC)- (present on admission) -Patient actively using cocaine, tobacco abuse and alcohol -Cessation counseling provided -TOC has been made aware to provide resources as an outpatient to assist patient with quitting process.  History of noncompliance with medical treatment -TOC consulted to help with any barriers of access to medicine to make sure patient has primary care provider at discharge for follow-up.  Tobacco abuse- (present on admission) -Cessation counseling provided -The patient had declined the use of nicotine patch.  Subjective:  Somnolent after receiving medication for alcohol withdrawal; no chest pain, no nausea, no bowel  afebrile.  Distress.  Still tachycardic with elevated blood pressure.  Physical Exam: Vitals:   02/21/21 1400 02/21/21 1402 02/21/21 1604 02/21/21 1700  BP: (!) 166/99   (!) 151/96  Pulse: (!) 117  (!) 111 (!) 110  Resp:  17 (!) 24 (!) 23  Temp:   99.8 F (37.7 C)   TempSrc:   Oral   SpO2: 100%  100% 99%  Weight:       General exam: Somnolent, but arousable and oriented to person, place and time.  No fever, no nausea, no vomiting. Respiratory system: Clear to auscultation. Respiratory effort normal.  No using accessory muscles.  Good saturation on room air. Cardiovascular system: Sinus tachycardia, no rubs, no gallops, no JVD. Gastrointestinal system: Abdomen is nondistended, soft and nontender. No organomegaly or masses felt. Normal bowel sounds heard. Central nervous system: No focal neurological deficits. Extremities: No cyanosis or clubbing. Skin: No petechiae. Psychiatry: Unable to properly assess with dictation; patient very somnolent after receiving treatment   Data Reviewed: Creatinine 0.9 CBGs less than 200 for more than 3 occasions -Potassium within normal limits. Patient is still demonstrating sinus tachycardia and elevated blood pressure.  Family Communication: No family at bedside.  Disposition: Status is: Inpatient Remains inpatient appropriate because: Receiving treatment with IV insulin drip, medications for alcohol withdrawal.  Follow clinical response.  Author: Vassie Loll, MD 02/21/2021 5:25 PM  For on call review [email or web address].

## 2021-02-21 NOTE — Progress Notes (Signed)
Dr Gwenlyn Perking aware endo tool stated blood sugars are stable and recommends to transition. No orders received at this time. Will continue with endo tool unless directed otherwise by Dr Gwenlyn Perking.

## 2021-02-21 NOTE — Progress Notes (Signed)
Inpatient Diabetes Program Recommendations  AACE/ADA: New Consensus Statement on Inpatient Glycemic Control (2015)  Target Ranges:  Prepandial:   less than 140 mg/dL      Peak postprandial:   less than 180 mg/dL (1-2 hours)      Critically ill patients:  140 - 180 mg/dL   Lab Results  Component Value Date   GLUCAP 186 (H) 02/21/2021   HGBA1C 13.0 (H) 07/10/2015    Review of Glycemic Control  Latest Reference Range & Units 02/21/21 06:50 02/21/21 09:35 02/21/21 10:46  Glucose-Capillary 70 - 99 mg/dL 952 (H) 841 (H) 324 (H)  (H): Data is abnormally high Diabetes history: Type 2 DM Outpatient Diabetes medications: NPH (NT) 25 units BID, Metformin 1000 mg BID ( NT) Current orders for Inpatient glycemic control: IV insulin A1C pending  Inpatient Diabetes Program Recommendations:    When ready to transition consider: -Semglee 18 units two hours prior to discontinuation then QD to follow -Novolog 0-9 units TID & HS  Secure chat sent to RN- per RN patient is not oriented and not appropriate to speak with regarding diabetes. Team will continue to follow for needs.   Thanks, Lujean Rave, MSN, RNC-OB Diabetes Coordinator [phone] (8a-5p)

## 2021-02-21 NOTE — Assessment & Plan Note (Addendum)
-  Borderline hyperkalemia at time of admission; with transient hypokalemia throughout treatment with insulin drip. -Potassium is currently stable

## 2021-02-21 NOTE — Progress Notes (Signed)
CBG now 118 one hour post levemir given. Insulin drip turned off per verbal from Dr Gwenlyn Perking to turn it off one hour after giving levemir. Patient ate about 80% of dinner and tolerated PO meds well. Appetite good. Patient slept most of the day and started confused this morning and pulling at lines when wrist restraints were released. By later afternoon around 1545, patient wakes up and states her name, birthdate, current location and year. Wrist restraints released and discontinued per protocol. Dr Gwenlyn Perking aware. Patient currently swallowing without difficulty and appears to be resting comfortably with no complaints or signs or symptoms of discomfort or distress.

## 2021-02-22 LAB — BASIC METABOLIC PANEL
Anion gap: 7 (ref 5–15)
BUN: 12 mg/dL (ref 6–20)
CO2: 24 mmol/L (ref 22–32)
Calcium: 8.9 mg/dL (ref 8.9–10.3)
Chloride: 104 mmol/L (ref 98–111)
Creatinine, Ser: 0.98 mg/dL (ref 0.44–1.00)
GFR, Estimated: >60 mL/min (ref >60)
Glucose, Bld: 211 mg/dL — ABNORMAL HIGH (ref 70–99)
Potassium: 4 mmol/L (ref 3.5–5.1)
Sodium: 135 mmol/L (ref 135–145)

## 2021-02-22 LAB — GLUCOSE, CAPILLARY
Glucose-Capillary: 223 mg/dL — ABNORMAL HIGH (ref 70–99)
Glucose-Capillary: 154 mg/dL — ABNORMAL HIGH (ref 70–99)
Glucose-Capillary: 220 mg/dL — ABNORMAL HIGH (ref 70–99)
Glucose-Capillary: 233 mg/dL — ABNORMAL HIGH (ref 70–99)
Glucose-Capillary: 379 mg/dL — ABNORMAL HIGH (ref 70–99)
Glucose-Capillary: 157 mg/dL — ABNORMAL HIGH (ref 70–99)

## 2021-02-22 LAB — CBC
HCT: 38.5 % (ref 36.0–46.0)
Hemoglobin: 13 g/dL (ref 12.0–15.0)
MCH: 31.7 pg (ref 26.0–34.0)
MCHC: 33.8 g/dL (ref 30.0–36.0)
MCV: 93.9 fL (ref 80.0–100.0)
Platelets: 356 10*3/uL (ref 150–400)
RBC: 4.1 MIL/uL (ref 3.87–5.11)
RDW: 12.3 % (ref 11.5–15.5)
WBC: 6.7 10*3/uL (ref 4.0–10.5)
nRBC: 0 % (ref 0.0–0.2)

## 2021-02-22 MED ORDER — ADULT MULTIVITAMIN W/MINERALS CH
1.0000 | ORAL_TABLET | Freq: Every day | ORAL | Status: DC
  Administered 2021-02-23: 1 via ORAL
  Filled 2021-02-22: qty 1

## 2021-02-22 MED ORDER — LOSARTAN POTASSIUM 50 MG PO TABS
25.0000 mg | ORAL_TABLET | Freq: Every day | ORAL | Status: DC
  Administered 2021-02-23: 25 mg via ORAL
  Filled 2021-02-22: qty 1

## 2021-02-22 MED ORDER — FOLIC ACID 1 MG PO TABS: 1.0000 mg | ORAL_TABLET | Freq: Every day | ORAL | Status: DC

## 2021-02-22 MED ORDER — LORAZEPAM 2 MG/ML IJ SOLN: 1.0000 mg | INTRAMUSCULAR | Status: DC | PRN

## 2021-02-22 MED ORDER — INSULIN DETEMIR 100 UNIT/ML ~~LOC~~ SOLN
8.0000 [IU] | Freq: Two times a day (BID) | SUBCUTANEOUS | Status: DC
  Administered 2021-02-22: 8 [IU] via SUBCUTANEOUS
  Filled 2021-02-22 (×4): qty 0.08

## 2021-02-22 MED ORDER — THIAMINE HCL 100 MG PO TABS
100.0000 mg | ORAL_TABLET | Freq: Every day | ORAL | Status: DC
  Administered 2021-02-23: 100 mg via ORAL
  Filled 2021-02-22: qty 1

## 2021-02-22 MED ORDER — THIAMINE HCL 100 MG/ML IJ SOLN: 100.0000 mg | Freq: Every day | INTRAMUSCULAR | Status: DC

## 2021-02-22 MED ORDER — ACETAMINOPHEN 325 MG PO TABS
650.0000 mg | ORAL_TABLET | Freq: Four times a day (QID) | ORAL | Status: DC | PRN
  Administered 2021-02-22 – 2021-02-23 (×3): 650 mg via ORAL
  Filled 2021-02-22 (×3): qty 2

## 2021-02-22 MED ORDER — LORAZEPAM 1 MG PO TABS: 1.0000 mg | ORAL_TABLET | ORAL | Status: DC | PRN

## 2021-02-22 NOTE — TOC Initial Note (Addendum)
Transition of Care [facility]) - Initial/Assessment Note    Patient Details  Name: Megan Mullins MRN: [id number] Date of Birth: 12-May-1970  [facility]) CM/SW Contact:    Salome Arnt, LCSW Phone Number: 02/22/2021, 8:57 AM  Clinical Narrative:  Pt admitted due to hyperglycemic crisis. She reports she lives with her mother and adult daughter. Pt recently started working part-time. She is independent with ADLs and family provides transportation. LCSW noted no insurance/PCP. Pt states she does have insurance through [employer]. She will bring card to hospital when able. Pt indicates she plans to make appointment to establish PCP soon. TOC received consult for substance use. Pt admits to drinking some alcohol last week at a party, but otherwise stopped 3 weeks ago. She said she has used drugs for 30+ years. Pt said she has no desire to stop. She declines substance use resources. TOC also consulted for medication assistance. Pt now has insurance.                   Expected Discharge Plan: Home/Self Care Barriers to Discharge: Continued Medical Work up   Patient Goals and CMS Choice Patient states their goals for this hospitalization and ongoing recovery are:: return home/back to work   Choice offered to / list presented to : Patient  Expected Discharge Plan and Services Expected Discharge Plan: Home/Self Care In-house Referral: Clinical Social Work     Living arrangements for the past 2 months: Single Family Home                                      Prior Living Arrangements/Services Living arrangements for the past 2 months: Single Family Home Lives with:: Parents, Adult Children Patient language and need for interpreter reviewed:: Yes Do you feel safe going back to the place where you live?: Yes      Need for Family Participation in Patient Care: No (Comment)     Criminal Activity/Legal Involvement Pertinent to Current Situation/Hospitalization:  No - Comment as needed  Activities of Daily Living      Permission Sought/Granted                  Emotional Assessment     Affect (typically observed): Appropriate Orientation: : Oriented to Self, Oriented to Place, Oriented to  Time, Oriented to Situation Alcohol / Substance Use: Illicit Drugs, Alcohol Use Psych Involvement: No (comment)  Admission diagnosis:  Hyponatremia [E87.1] Primary hypertension [I10] Renal insufficiency [N28.9] Hyperglycemic crisis in diabetes mellitus ([place]) [E11.65] Severe hyperglycemia due to diabetes mellitus ([place]) [E11.65] Patient Active Problem List   Diagnosis Date Noted   Hyperkalemia 02/21/2021   Hyperglycemic crisis in diabetes mellitus ([place]) 02/20/2021   Substance abuse ([place]) 02/20/2021   Hypertensive crisis 02/20/2021   Alcohol abuse 02/20/2021   AKI (acute kidney injury) ([place]) 02/20/2021   Trichomonas infection 02/20/2021   Hyponatremia 07/10/2015   History of noncompliance with medical treatment 07/09/2015   Type 2 diabetes mellitus with hyperosmolar nonketotic hyperglycemia ([place]) 03/13/2014   Obesity 03/13/2014   Tobacco abuse 03/13/2014   PCP:  Patient, No Pcp Per (Inactive) Pharmacy:   [place] [address] [place] [address] [place] [address] Phone: [phone] Fax: [phone]     Social Determinants of Health (SDOH) Interventions    Readmission Risk Interventions No flowsheet data found.

## 2021-02-22 NOTE — Progress Notes (Signed)
Progress Note   Patient: Megan Mullins DOB: 12-Jan-1971 DOA: 02/20/2021     1 DOS: the patient was seen and examined on 02/22/2021   Brief hospital admission narrative: As per H&P written by Dr. Carren Rang on 02/20/21 Megan Mullins is a 51 y.o. female with medical history significant of history of diabetes mellitus, polysubstance abuse, presents to ED with a chief complaint of "I felt like my sugar was high."  Patient reports that she has had polyuria polydipsia for approximately 3 days.  Its been constant over that time period, and has not gotten worse.  She reports associated fatigue.  She has no body aches, chest pain, palpitations, shortness of breath, diarrhea, dizziness, lightheadedness.  She reports she has not been checking her glucose.  The last time she took her insulin was approximately 1 month ago.  Patient is very flippant about trying to discuss why she has not taken her insulin or checked her glucose.  May be barriers with access to medication?  Patient has no other complaints at this time.   Patient does smoke but does not want a nicotine patch, drinks alcohol with her last drink being 1 week ago.  Prior to that she was having about 80 ounces of beer per day per her report.  She denies any withdrawal symptoms.  Patient also uses cocaine.  Her last cocaine use was yesterday.  This explains her blood pressure of 193/132.  Hydralazine given in the ED.  Patient is vaccinated for COVID.  Patient is full code.   Assessment and Plan: * Hyperglycemic crisis in diabetes mellitus (HCC)- (present on admission) -patient with hyperosmolar non ketotic acidosis at time of admission.  -Hyperglycemia at 954 on presentation. -secondary to medication noncompliance and use of recreational drugs (UDS positive for cocaine). -Excellent response to insulin drip, IV fluids and n.p.o. status. -transitioned to SSI and long acting insulin; CBG still in the 200-mid 200 range -Modified carbohydrate  ordered; Continue to follow CBGs to further adjust hypoglycemia regimen. -Prior to admission patient was supposed to be on 25 units of insulin (Lantus) and metformin.   Hyperkalemia -Borderline hyperkalemia at time of admission; with transient hypokalemia throughout treatment with insulin drip. -Potassium is currently stable -Continue to follow trend intermittently.  Trichomonas infection -Given patient history of noncompliance single dose metronidazole treatment was selected -Patient received 2 g Flagyl x1 dose.   AKI (acute kidney injury) (HCC)- (present on admission) -Creatinine baseline 0.7 -At time of admission creatinine peaked at 1.44. -Appears to be in the setting of prerenal azotemia and dehydration from ongoing hyperosmolar nonketotic acidosis. -Excellent response to aggressive fluid resuscitation -Renal function is now back to normal. -Continue to follow electrolytes and renal function trend.  Alcohol abuse- (present on admission) -Continue CIWA protocol -Continue thiamine and folic acid -Cessation counseling provided. .  Hypertensive crisis- (present on admission) -No endorgan damage appreciated -good response to initiation of cozaar and PRN hydralazine. -Ongoing high blood pressure in the setting of withdrawal  -Follow-up vital signs.  Substance abuse (HCC)- (present on admission) -Patient actively using cocaine, tobacco abuse and alcohol -Cessation counseling provided -TOC has been made aware to provide resources as an outpatient to assist patient with quitting process.  History of noncompliance with medical treatment -TOC consulted to help with any barriers of access to medicine to make sure patient has primary care provider at discharge for follow-up.  Tobacco abuse- (present on admission) -Cessation counseling provided -patient has declined the use of nicotine patch.  Subjective:  Improved mentation, no CP, no nausea, no vomiting. Tolerating diet.  Will transfer to telemetry bed and continue adjusting hypoglycemic regimen.   Physical Exam: Vitals:   02/22/21 0300 02/22/21 0400 02/22/21 0500 02/22/21 0742  BP: (!) 119/98 121/73 126/60   Pulse: (!) 118 (!) 102 100 (!) 117  Resp: (!) 23 20 (!) 37 19  Temp:  99.9 F (37.7 C)  98.6 F (37 C)  TempSrc:  Oral  Oral  SpO2: 100% 98% 98% 100%  Weight:      General exam: Alert, awake, oriented x 3, following commands appropriately  Respiratory system: positive scattered rhonchi, no wheezing, no crackles   Cardiovascular system:sinus tachycardia (improved), no rubs or gallops.  Gastrointestinal system: Abdomen is nondistended, soft and nontender. No organomegaly or masses felt. Normal bowel sounds heard. Central nervous system: Alert and oriented. No focal neurological deficits. Extremities: No cyanosis, no clubbing. Skin: No petechiae. Psychiatry: Judgement and insight appear normal. Mood & affect appropriate.   Data Reviewed: -CBG 211 -Creatinine .98 -safe to transfer to telemetry bed.  Family Communication: No family at bedside.  Disposition: Status is: Inpatient Remains inpatient appropriate because: Receiving treatment with insulin therapy and medications for alcohol withdrawal.  Follow clinical response. Hopefully ready for discharge on 02/23/21  Author: Vassie Loll, MD 02/22/2021 10:56 AM  For on call review [email or web address].

## 2021-02-23 LAB — HEMOGLOBIN A1C

## 2021-02-23 LAB — PHOSPHORUS: Phosphorus: 3.4 mg/dL (ref 2.5–4.6)

## 2021-02-23 LAB — COMPREHENSIVE METABOLIC PANEL
ALT: 19 U/L (ref 0–44)
AST: 24 U/L (ref 15–41)
Albumin: 2.6 g/dL — ABNORMAL LOW (ref 3.5–5.0)
Alkaline Phosphatase: 67 U/L (ref 38–126)
Anion gap: 5 (ref 5–15)
BUN: 13 mg/dL (ref 6–20)
CO2: 24 mmol/L (ref 22–32)
Calcium: 8.1 mg/dL — ABNORMAL LOW (ref 8.9–10.3)
Chloride: 104 mmol/L (ref 98–111)
Creatinine, Ser: 0.89 mg/dL (ref 0.44–1.00)
GFR, Estimated: >60 mL/min (ref >60)
Glucose, Bld: 392 mg/dL — ABNORMAL HIGH (ref 70–99)
Potassium: 3.7 mmol/L (ref 3.5–5.1)
Sodium: 133 mmol/L — ABNORMAL LOW (ref 135–145)
Total Bilirubin: 0.4 mg/dL (ref 0.3–1.2)
Total Protein: 5.8 g/dL — ABNORMAL LOW (ref 6.5–8.1)

## 2021-02-23 LAB — CBC
HCT: 34.1 % — ABNORMAL LOW (ref 36.0–46.0)
Hemoglobin: 11 g/dL — ABNORMAL LOW (ref 12.0–15.0)
MCH: 30.3 pg (ref 26.0–34.0)
MCHC: 32.3 g/dL (ref 30.0–36.0)
MCV: 93.9 fL (ref 80.0–100.0)
Platelets: 296 10*3/uL (ref 150–400)
RBC: 3.63 MIL/uL — ABNORMAL LOW (ref 3.87–5.11)
RDW: 12.2 % (ref 11.5–15.5)
WBC: 4.4 10*3/uL (ref 4.0–10.5)
nRBC: 0 % (ref 0.0–0.2)

## 2021-02-23 LAB — MAGNESIUM: Magnesium: 1.6 mg/dL — ABNORMAL LOW (ref 1.7–2.4)

## 2021-02-23 LAB — GLUCOSE, CAPILLARY
Glucose-Capillary: 329 mg/dL — ABNORMAL HIGH (ref 70–99)
Glucose-Capillary: 261 mg/dL — ABNORMAL HIGH (ref 70–99)

## 2021-02-23 MED ORDER — MAGNESIUM SULFATE 2 GM/50ML IV SOLN
2.0000 g | Freq: Once | INTRAVENOUS | Status: AC
  Administered 2021-02-23: 2 g via INTRAVENOUS
  Filled 2021-02-23: qty 50

## 2021-02-23 MED ORDER — INSULIN NPH (HUMAN) (ISOPHANE) 100 UNIT/ML ~~LOC~~ SUSP: 25.0000 [IU] | Freq: Two times a day (BID) | SUBCUTANEOUS | 1 refills | Status: DC

## 2021-02-23 MED ORDER — METFORMIN HCL 1000 MG PO TABS: 1000.0000 mg | ORAL_TABLET | Freq: Two times a day (BID) | ORAL | 1 refills | Status: DC

## 2021-02-23 MED ORDER — BLOOD GLUCOSE MONITOR KIT: PACK | 11 refills | Status: DC

## 2021-02-23 MED ORDER — INSULIN DETEMIR 100 UNIT/ML ~~LOC~~ SOLN
12.0000 [IU] | Freq: Two times a day (BID) | SUBCUTANEOUS | Status: DC
  Administered 2021-02-23: 12 [IU] via SUBCUTANEOUS
  Filled 2021-02-23 (×3): qty 0.12

## 2021-02-23 MED ORDER — LOSARTAN POTASSIUM 25 MG PO TABS: 25.0000 mg | ORAL_TABLET | Freq: Every day | ORAL | 1 refills | Status: DC

## 2021-02-23 MED ORDER — INSULIN ASPART 100 UNIT/ML IJ SOLN
5.0000 [IU] | Freq: Three times a day (TID) | INTRAMUSCULAR | Status: DC
  Administered 2021-02-23: 5 [IU] via SUBCUTANEOUS

## 2021-02-23 MED ORDER — THIAMINE HCL 100 MG PO TABS: 100.0000 mg | ORAL_TABLET | Freq: Every day | ORAL | 1 refills | Status: DC

## 2021-02-23 MED ORDER — FOLIC ACID 1 MG PO TABS: 1.0000 mg | ORAL_TABLET | Freq: Every day | ORAL | 1 refills | Status: AC

## 2021-02-23 NOTE — Evaluation (Signed)
Physical Therapy Evaluation Patient Details Name: Megan Mullins MRN: [id number] DOB: January 07, 1971 Today's Date: 02/23/2021  History of Present Illness  Megan Mullins is a 51 y.o. female with medical history significant of history of diabetes mellitus, polysubstance abuse, presents to ED with a chief complaint of "I felt like my sugar was high."  Patient reports that she has had polyuria polydipsia for approximately 3 days.  Its been constant over that time period, and has not gotten worse.  She reports associated fatigue.  She has no body aches, chest pain, palpitations, shortness of breath, diarrhea, dizziness, lightheadedness.  She reports she has not been checking her glucose.  The last time she took her insulin was approximately 1 month ago.  Patient is very flippant about trying to discuss why she has not taken her insulin or checked her glucose.  May be barriers with access to medication?  Patient has no other complaints at this time.     Patient does smoke but does not want a nicotine patch, drinks alcohol with her last drink being 1 week ago.  Prior to that she was having about 80 ounces of beer per day per her report.  She denies any withdrawal symptoms.  Patient also uses cocaine.  Her last cocaine use was yesterday.  This explains her blood pressure of 193/132.  Hydralazine given in the   Clinical Impression  Patient functioning at baseline for functional mobility and gait.  Plan:  Patient discharged from physical therapy to care of nursing for ambulation daily as tolerated for length of stay.         Recommendations for follow up therapy are one component of a multi-disciplinary discharge planning process, led by the attending physician.  Recommendations may be updated based on patient status, additional functional criteria and insurance authorization.  Follow Up Recommendations No PT follow up    Assistance Recommended at Discharge PRN  Patient can return home with the following  Other  (comment) (near baseline)    Equipment Recommendations None recommended by PT  Recommendations for Other Services       Functional Status Assessment       Precautions / Restrictions Precautions Precautions: None Restrictions Weight Bearing Restrictions: No      Mobility  Bed Mobility Overal bed mobility: Independent                  Transfers Overall transfer level: Independent                      Ambulation/Gait Ambulation/Gait assistance: Modified independent (Device/Increase time) Gait Distance (Feet): 200 Feet Assistive device: None Gait Pattern/deviations: WFL(Within Functional Limits) Gait velocity: slightly decreased     General Gait Details: grossly WFL demonstrating good return for ambulation on level, inclined and declined surfaces without loss of balance  Stairs            Wheelchair Mobility    Modified Rankin (Stroke Patients Only)       Balance Overall balance assessment: No apparent balance deficits (not formally assessed)                                           Pertinent Vitals/Pain Pain Assessment Pain Assessment: No/denies pain    Home Living Family/patient expects to be discharged to:: Private residence Living Arrangements: Spouse/significant other;Children;Parent Available Help at Discharge: Family;Available 24 hours/day Type  of Home: House Home Access: Ramped entrance       Home Layout: One level Home Equipment: None      Prior Function Prior Level of Function : Independent/Modified Independent             Mobility Comments: Community ambulator without AD, does not drive ADLs Comments: Independent     Hand Dominance        Extremity/Trunk Assessment   Upper Extremity Assessment Upper Extremity Assessment: Overall WFL for tasks assessed    Lower Extremity Assessment Lower Extremity Assessment: Overall WFL for tasks assessed    Cervical / Trunk Assessment Cervical  / Trunk Assessment: Normal  Communication   Communication: No difficulties  Cognition Arousal/Alertness: Awake/alert Behavior During Therapy: WFL for tasks assessed/performed Overall Cognitive Status: Within Functional Limits for tasks assessed                                          General Comments      Exercises     Assessment/Plan    PT Assessment Patient does not need any further PT services  PT Problem List         PT Treatment Interventions      PT Goals (Current goals can be found in the Care Plan section)  Acute Rehab PT Goals Patient Stated Goal: return home with family to assist PT Goal Formulation: With patient Time For Goal Achievement: 02/23/21 Potential to Achieve Goals: Good    Frequency       Co-evaluation               AM-PAC PT "6 Clicks" Mobility  Outcome Measure Help needed turning from your back to your side while in a flat bed without using bedrails?: None Help needed moving from lying on your back to sitting on the side of a flat bed without using bedrails?: None Help needed moving to and from a bed to a chair (including a wheelchair)?: None Help needed standing up from a chair using your arms (e.g., wheelchair or bedside chair)?: None Help needed to walk in hospital room?: None Help needed climbing 3-5 steps with a railing? : None 6 Click Score: 24    End of Session   Activity Tolerance: Patient tolerated treatment well Patient left: in bed;with call bell/phone within reach Nurse Communication: Mobility status PT Visit Diagnosis: Unsteadiness on feet (R26.81);Other abnormalities of gait and mobility (R26.89);Muscle weakness (generalized) (M62.81)    Time: [id number] PT Time Calculation (min) (ACUTE ONLY): 13 min   Charges:   PT Evaluation $PT Eval Low Complexity: 1 Low PT Treatments $Therapeutic Activity: 8-22 mins        12:30 PM, 02/23/21 Lonell Grandchild, MPT Physical Therapist with [facility] [phone] office [phone] mobile phone

## 2021-02-23 NOTE — Assessment & Plan Note (Signed)
Replace ° °

## 2021-02-23 NOTE — TOC Transition Note (Signed)
Transition of Care [facility]) - CM/SW Discharge Note   Patient Details  Name: Megan Mullins MRN: [id number] Date of Birth: Jun 06, 1970  [facility]) CM/SW Contact:  Karn Cassis, LCSW Phone Number: 02/23/2021, 10:58 AM   Clinical Narrative:  Pt d/c today. PCP list provided to pt. No other needs reported.       Final next level of care: Home/Self Care Barriers to Discharge: Barriers Resolved   Patient Goals and CMS Choice Patient states their goals for this hospitalization and ongoing recovery are:: return home/back to work   Choice offered to / list presented to : Patient  Discharge Placement                       Discharge Plan and Services In-house Referral: Clinical Social Work                                   Social Determinants of Health (SDOH) Interventions     Readmission Risk Interventions No flowsheet data found.

## 2021-02-23 NOTE — Discharge Summary (Signed)
Physician Discharge Summary   Patient: Megan Mullins MRN: [id number] DOB: [birth year]  Admit date:     02/20/2021  Discharge date: 02/23/21  Discharge Physician: Dessa Phi   PCP: Patient, No Pcp Per (Inactive) . Will ask TOC to help set her up with PCP.   Recommendations at discharge:   Follow up with PCP as soon as able for DM control   Discharge Diagnoses: Principal Problem:   Hyperglycemic crisis in diabetes mellitus ([place]) Active Problems:   Tobacco abuse   History of noncompliance with medical treatment   Substance abuse ([place])   Hypertensive crisis   Alcohol abuse   AKI (acute kidney injury) ([place])   Trichomonas infection   Hyperkalemia   Hypomagnesemia  Resolved Problems:   * No resolved hospital problems. [facility] Course: As per H&P written by Dr. Clearence Ped on 02/20/21 Megan Mullins is a 51 y.o. female with medical history significant of history of diabetes mellitus, polysubstance abuse, presents to ED with a chief complaint of "I felt like my sugar was high."  Patient reports that she has had polyuria polydipsia for approximately 3 days.  Its been constant over that time period, and has not gotten worse.  She reports associated fatigue.  She has no body aches, chest pain, palpitations, shortness of breath, diarrhea, dizziness, lightheadedness.  She reports she has not been checking her glucose.  The last time she took her insulin was approximately 1 month ago.  Patient is very flippant about trying to discuss why she has not taken her insulin or checked her glucose.  May be barriers with access to medication?  Patient has no other complaints at this time.   Patient does smoke but does not want a nicotine patch, drinks alcohol with her last drink being 1 week ago.  Prior to that she was having about 80 ounces of beer per day per her report.  She denies any withdrawal symptoms.  Patient also uses cocaine.  Her last cocaine use was yesterday.  This explains her blood  pressure of 193/132.  Hydralazine given in the ED.  Patient is vaccinated for COVID.  Patient is full code.  Patient was admitted for hyperglycemic crisis without DKA.  She was managed with IV insulin, IV fluids and transition to subcutaneous insulin.  Hyperkalemia resolved.  She met with TOC and had no intention to quit illicit substance use.  AKI resolved.  Patient was counseled on the importance of close follow-up with primary care physician as well as checking her blood sugars regularly, taking her insulin.  Risk of medical noncompliance includes death.  Assessment and Plan: * Hyperglycemic crisis in diabetes mellitus ([place])- (present on admission) -Patient with hyperosmolar non ketotic acidosis at time of admission.  -Hyperglycemia at 954 on presentation. -Secondary to medication noncompliance and use of recreational drugs (UDS positive for cocaine). -Excellent response to insulin drip, IV fluids and n.p.o. status. -Transitioned to SSI and long acting insulin -Prior to admission patient was supposed to be on 25 units of insulin (Lantus) and metformin. -Stressed importance of glycemic control with insulin and checking her blood sugars consistently   Hypomagnesemia -Replace  Hyperkalemia -Borderline hyperkalemia at time of admission; with transient hypokalemia throughout treatment with insulin drip. -Potassium is currently stable   Trichomonas infection -Given patient history of noncompliance single dose metronidazole treatment was selected -Patient received 2 g Flagyl x1 dose.   AKI (acute kidney injury) ([place])- (present on admission) -Creatinine baseline 0.7 -At time of admission creatinine  peaked at 1.44. -Appears to be in the setting of prerenal azotemia and dehydration from ongoing hyperosmolar nonketotic acidosis. -Excellent response to aggressive fluid resuscitation -Renal function is now back to normal. -Continue to follow electrolytes and renal function trend.  Alcohol  abuse- (present on admission) -Continue thiamine and folic acid -Cessation counseling provided  Hypertensive crisis- (present on admission) -No endorgan damage appreciated   Substance abuse ([place])- (present on admission) -Patient actively using cocaine, tobacco abuse and alcohol -Cessation counseling provided -TOC has been made aware to provide resources as an outpatient to assist patient with quitting process.  History of noncompliance with medical treatment -TOC consulted to help with any barriers of access to medicine to make sure patient has primary care provider at discharge for follow-up.  Tobacco abuse- (present on admission) -Cessation counseling provided -patient has declined the use of nicotine patch.    DISCHARGE MEDICATION: Allergies as of 02/23/2021   No Known Allergies      Medication List     STOP taking these medications    cephALEXin 500 MG capsule Commonly known as: KEFLEX       TAKE these medications    blood glucose meter kit and supplies Kit To check CBG every day before breakfast and before dinner.   folic acid 1 MG tablet Commonly known as: FOLVITE Take 1 tablet (1 mg total) by mouth daily.   insulin NPH Human 100 UNIT/ML injection Commonly known as: NOVOLIN N Inject 0.25 mLs (25 Units total) into the skin 2 (two) times daily at 8 am and 10 pm.   losartan 25 MG tablet Commonly known as: COZAAR Take 1 tablet (25 mg total) by mouth daily.   metFORMIN 1000 MG tablet Commonly known as: GLUCOPHAGE Take 1 tablet (1,000 mg total) by mouth 2 (two) times daily with a meal.   thiamine 100 MG tablet Take 1 tablet (100 mg total) by mouth daily.         Discharge Exam: Filed Weights   02/20/21 1821  Weight: 70.9 kg     Condition at discharge: improving  The results of significant diagnostics from this hospitalization (including imaging, microbiology, ancillary and laboratory) are listed below for reference.   Imaging Studies: DG  Chest Portable 1 View  Result Date: 02/20/2021 CLINICAL DATA:  Hyperglycemia, diabetic ketoacidosis EXAM: PORTABLE CHEST 1 VIEW COMPARISON:  03/13/2014 FINDINGS: Lungs are clear. No pneumothorax or pleural effusion. Cardiac size within normal limits. Pulmonary vascularity is normal. No acute bone abnormality. Rounded ossific density subjacent to the coracoid process may represent intra-articular loose bodies within the right shoulder. IMPRESSION: No radiographic evidence of acute cardiopulmonary disease. Electronically Signed   By: Fidela Salisbury M.D.   On: 02/20/2021 19:38    Microbiology: Results for orders placed or performed during the hospital encounter of 02/20/21  Resp Panel by RT-PCR (Flu A&B, Covid) Nasopharyngeal Swab     Status: None   Collection Time: 02/20/21  8:10 PM   Specimen: Nasopharyngeal Swab; Nasopharyngeal(NP) swabs in vial transport medium  Result Value Ref Range Status   SARS Coronavirus 2 by RT PCR NEGATIVE NEGATIVE Final    Comment: (NOTE) SARS-CoV-2 target nucleic acids are NOT DETECTED.  The SARS-CoV-2 RNA is generally detectable in upper respiratory specimens during the acute phase of infection. The lowest concentration of SARS-CoV-2 viral copies this assay can detect is 138 copies/mL. A negative result does not preclude SARS-Cov-2 infection and should not be used as the sole basis for treatment or other patient management decisions.  A negative result may occur with  improper specimen collection/handling, submission of specimen other than nasopharyngeal swab, presence of viral mutation(s) within the areas targeted by this assay, and inadequate number of viral copies(<138 copies/mL). A negative result must be combined with clinical observations, patient history, and epidemiological information. The expected result is Negative.  Fact Sheet for Patients:  EntrepreneurPulse.com.au  Fact Sheet for Healthcare Providers:   IncredibleEmployment.be  This test is no t yet approved or cleared by the [place] FDA and  has been authorized for detection and/or diagnosis of SARS-CoV-2 by FDA under an Emergency Use Authorization (EUA). This EUA will remain  in effect (meaning this test can be used) for the duration of the COVID-19 declaration under Section 564(b)(1) of the Act, 21 U.S.C.section 360bbb-3(b)(1), unless the authorization is terminated  or revoked sooner.       Influenza A by PCR NEGATIVE NEGATIVE Final   Influenza B by PCR NEGATIVE NEGATIVE Final    Comment: (NOTE) The Xpert Xpress SARS-CoV-2/FLU/RSV plus assay is intended as an aid in the diagnosis of influenza from Nasopharyngeal swab specimens and should not be used as a sole basis for treatment. Nasal washings and aspirates are unacceptable for Xpert Xpress SARS-CoV-2/FLU/RSV testing.  Fact Sheet for Patients: EntrepreneurPulse.com.au  Fact Sheet for Healthcare Providers: IncredibleEmployment.be  This test is not yet approved or cleared by the [place] FDA and has been authorized for detection and/or diagnosis of SARS-CoV-2 by FDA under an Emergency Use Authorization (EUA). This EUA will remain in effect (meaning this test can be used) for the duration of the COVID-19 declaration under Section 564(b)(1) of the Act, 21 U.S.C. section 360bbb-3(b)(1), unless the authorization is terminated or revoked.  Performed at [facility], [address], [place], [place] [address]   MRSA Next Gen by PCR, Nasal     Status: None   Collection Time: 02/21/21  8:02 AM   Specimen: Nasal Mucosa; Nasal Swab  Result Value Ref Range Status   MRSA by PCR Next Gen NOT DETECTED NOT DETECTED Final    Comment: (NOTE) The GeneXpert MRSA Assay (FDA approved for NASAL specimens only), is one component of a comprehensive MRSA colonization surveillance program. It is not intended to diagnose MRSA  infection nor to guide or monitor treatment for MRSA infections. Test performance is not FDA approved in patients less than 64 years old. Performed at [facility], [address], [place], [place] [address]     Labs: CBC: Recent Labs  Lab 02/20/21 1834 02/21/21 0404 02/22/21 0449 02/23/21 0525  WBC 5.3 9.7 6.7 4.4  NEUTROABS 3.8  --   --   --   HGB 14.6 12.7 13.0 11.0*  HCT 43.8 37.5 38.5 34.1*  MCV 92.2 91.9 93.9 93.9  PLT 387 348 356 390   Basic Metabolic Panel: Recent Labs  Lab 02/21/21 0404 02/21/21 0838 02/21/21 1248 02/21/21 1733 02/22/21 0449 02/23/21 0525  NA 134* 136 136  --  135 133*  K 3.4* 3.7 3.9  --  4.0 3.7  CL 97* 98 101  --  104 104  CO2 $Re'27 29 29  'uvv$ --  24 24  GLUCOSE 228* 223* 185*  --  211* 392*  BUN $Re'12 11 10  'syw$ --  12 13  CREATININE 1.05* 0.98 0.88  --  0.98 0.89  CALCIUM 9.3 9.3 9.3  --  8.9 8.1*  MG 1.8  --   --  1.7  --  1.6*  PHOS  --   --   --   --   --  3.4   Liver Function Tests: Recent Labs  Lab 02/23/21 0525  AST 24  ALT 19  ALKPHOS 67  BILITOT 0.4  PROT 5.8*  ALBUMIN 2.6*   CBG: Recent Labs  Lab 02/22/21 0741 02/22/21 1124 02/22/21 1628 02/22/21 2107 02/23/21 0713  GLUCAP 220* 233* 379* 157* 329*    Discharge time spent: greater than 30 minutes.  Signed: Dessa Phi, DO [facility] 02/23/2021

## 2021-02-24 ENCOUNTER — Telehealth: Payer: Self-pay

## 2021-02-24 NOTE — Telephone Encounter (Addendum)
Pt contacted [employer] program by phone to inquire about process of enrolling into the Care Connect Uninsured Program.  States she was referred by [facility] as a result of discharge and needing to be connected with a PCP.     Pt states she was admitted on 02/20/21 and discharged on 02/23/21.  States she was admitted as a result of an Elevated Blood Sugar of 960 and  has had  known diagnosis of Type 2 Diabetic before admission.      Pt was interviewed regarding program requirements. Pt was found to complete requirements successfully.     An interview of past medical history and current medical needs were conducted and completed.  Pt states hx of high blood pressure and diabetes and denies any other medical conditions  All required documents on checklist were reviewed to have available during their appointment.  Appt was scheduled for Tuesday, 2.14.23 at 1:30 pm to be held with a Care Connect Uninsured Program enrollment specialist  A reminder text message was also sent with appointment information and required documents to bring to appointment  Pt stated she understood all information discussed and call ended

## 2021-03-01 LAB — POCT GLYCOSYLATED HEMOGLOBIN (HGB A1C): HbA1c POC (<> result, manual entry): 14 % (ref 4.0–5.6)

## 2021-03-01 LAB — GLUCOSE, POCT (MANUAL RESULT ENTRY): POC Glucose: 164 mg/dl — AB (ref 70–99)

## 2021-03-08 ENCOUNTER — Encounter: Payer: Self-pay | Admitting: Physician Assistant

## 2021-03-08 ENCOUNTER — Other Ambulatory Visit: Payer: Self-pay

## 2021-03-08 ENCOUNTER — Ambulatory Visit: Payer: Self-pay | Admitting: Physician Assistant

## 2021-03-08 VITALS — BP 153/94 | HR 110 | Temp 98.0°F | Ht 62.5 in | Wt 171.0 lb

## 2021-03-08 DIAGNOSIS — I10 Essential (primary) hypertension: Secondary | ICD-10-CM

## 2021-03-08 DIAGNOSIS — R000 Tachycardia, unspecified: Secondary | ICD-10-CM

## 2021-03-08 DIAGNOSIS — F17200 Nicotine dependence, unspecified, uncomplicated: Secondary | ICD-10-CM

## 2021-03-08 DIAGNOSIS — Z7689 Persons encountering health services in other specified circumstances: Secondary | ICD-10-CM

## 2021-03-08 DIAGNOSIS — E1165 Type 2 diabetes mellitus with hyperglycemia: Secondary | ICD-10-CM

## 2021-03-08 DIAGNOSIS — Z5320 Procedure and treatment not carried out because of patient's decision for unspecified reasons: Secondary | ICD-10-CM

## 2021-03-08 DIAGNOSIS — F1910 Other psychoactive substance abuse, uncomplicated: Secondary | ICD-10-CM

## 2021-03-08 MED ORDER — METOPROLOL TARTRATE 50 MG PO TABS: 50.0000 mg | ORAL_TABLET | Freq: Two times a day (BID) | ORAL | 0 refills | Status: DC

## 2021-03-08 MED ORDER — LANTUS SOLOSTAR 100 UNIT/ML ~~LOC~~ SOPN: 10.0000 [IU] | PEN_INJECTOR | Freq: Every day | SUBCUTANEOUS | 99 refills | Status: DC

## 2021-03-08 NOTE — Progress Notes (Signed)
BP (!) 153/94    Pulse (!) 110    Temp 98 F (36.7 C)    Ht 5' 2.5" (1.588 m)    Wt 171 lb (77.6 kg)    SpO2 98%    BMI 30.78 kg/m    Subjective:    Patient ID: Megan Mullins, female    DOB: January 16, 1971, 51 y.o.   MRN: [id number]  HPI: Megan Mullins is a 51 y.o. female presenting on 03/08/2021 for New Patient (Initial Visit)   HPI  Chief Complaint  Patient presents with   New Patient (Initial Visit)     Pt is 50yoF who presents as a new patient.  She has Uncontrolled DM.  She says her DM was diagnosed at about age 36.  She was in hospital ealier this month for her DM with initial bs over 900.  She is currently using  25 u novolin R bid.  She says her fbs this morning was 130.     A1C  13.0      02/21/21   She was working-  Started jan 25 last day feb 4-  working at [employer].  She says she lost her job when she had to go into hospital.  She was working at [employer] prior to that.  She says she worked there for several years.  Per reiview of Epic records, she has history alcohol abuse and cocaine and MJ use.  She is not forthcoming with this information and denied use before information seen in Epic records.  She says she has never had a PCP.  She has lived in this area all her life.  She is having no problems with depression or anxiety.    She says she has Never had a mammogram and she doesn't want one     Relevant past medical, surgical, family and social history reviewed and updated as indicated. Interim medical history since our last visit reviewed. Allergies and medications reviewed and updated.   Current Outpatient Medications:    folic acid (FOLVITE) 1 MG tablet, Take 1 tablet (1 mg total) by mouth daily., Disp: 30 tablet, Rfl: 1   insulin NPH Human (NOVOLIN N) 100 UNIT/ML injection, Inject 0.25 mLs (25 Units total) into the skin 2 (two) times daily at 8 am and 10 pm., Disp: 10 mL, Rfl: 1   losartan (COZAAR) 25 MG tablet, Take 1 tablet (25 mg total) by mouth daily., Disp:  30 tablet, Rfl: 1   metFORMIN (GLUCOPHAGE) 1000 MG tablet, Take 1 tablet (1,000 mg total) by mouth 2 (two) times daily with a meal., Disp: 60 tablet, Rfl: 1   thiamine 100 MG tablet, Take 1 tablet (100 mg total) by mouth daily. (Patient not taking: Reported on 03/08/2021), Disp: 30 tablet, Rfl: 1   Review of Systems  Per HPI unless specifically indicated above     Objective:    BP (!) 153/94    Pulse (!) 110    Temp 98 F (36.7 C)    Ht 5' 2.5" (1.588 m)    Wt 171 lb (77.6 kg)    SpO2 98%    BMI 30.78 kg/m   Wt Readings from Last 3 Encounters:  03/08/21 171 lb (77.6 kg)  02/20/21 156 lb 4.9 oz (70.9 kg)  09/25/17 215 lb (97.5 kg)    Physical Exam Vitals reviewed.  Constitutional:      General: She is not in acute distress.    Appearance: She is well-developed. She is not toxic-appearing.  HENT:     Head: Normocephalic and atraumatic.     Right Ear: Tympanic membrane, ear canal and external ear normal.     Left Ear: Tympanic membrane, ear canal and external ear normal.  Eyes:     Extraocular Movements: Extraocular movements intact.     Conjunctiva/sclera: Conjunctivae normal.     Pupils: Pupils are equal, round, and reactive to light.  Neck:     Thyroid: No thyromegaly.  Cardiovascular:     Rate and Rhythm: Regular rhythm. Tachycardia present.  Pulmonary:     Effort: Pulmonary effort is normal.     Breath sounds: Normal breath sounds.  Abdominal:     General: Bowel sounds are normal.     Palpations: Abdomen is soft. There is no mass.     Tenderness: There is no abdominal tenderness.  Musculoskeletal:     Cervical back: Neck supple.     Right lower leg: No edema.     Left lower leg: No edema.  Lymphadenopathy:     Cervical: No cervical adenopathy.  Skin:    General: Skin is warm and dry.  Neurological:     Mental Status: She is alert and oriented to person, place, and time.     Motor: No weakness or tremor.     Gait: Gait normal.  Psychiatric:        Attention  and Perception: Attention normal.        Behavior: Behavior normal. Behavior is cooperative.     Comments: Pleasant.  Engaged.      EKG- ST at 112 bpm.  No changes compared with EKG from 2019     Assessment & Plan:    Encounter Diagnoses  Name Primary?   Encounter to establish care Yes   Uncontrolled type 2 diabetes mellitus with hyperglycemia (HCC)    Primary hypertension    Tachycardia    Tobacco use disorder    Substance abuse (HCC)    Screening mammography declined    Colon cancer screening declined      -pt was encouraged to Apply for FP medicaid - to get PAP -will refer for DM eye exam (either location) -DM foot exam performed -will check Labs- lipids, microalbumin  tsh -pt Declined screening mammogram -pt Declined FIT test/colon cancer screening -will Add beta blocker for tachycardia and poorly controlled BP -will discontinue novolin R and start Lantus at 10units qhs.  Pt is taught how to use it and is given reading information.  She is to monitor her bs.  She is to call office for fbs < 70 or > 300 -pt to follow up 2 week so can order meds from [facility] after review of labs.  She is to contact office sooner prn

## 2021-03-08 NOTE — Patient Instructions (Signed)
Insulin Injection Instructions, Using Insulin Pens, Adult There are many different types of insulin. The type of insulin that you take may determine how many injections you give yourself and when you need to give the injections. Supplies needed: Soap and water. Your insulin pen. A new needle. Alcohol wipes. A disposal container for sharp items (sharps container), such as an empty plastic bottle with a cover. How to choose a site for injection The body absorbs insulin differently, depending on where the insulin is injected (injection site). It is best to inject insulin into the same body area each time; for example, always in the abdomen. However, you should use a different spot in that area for each injection. Do not inject the insulin in the same spot each time. There are five main areas that can be used for injecting. These areas are: Abdomen. This is the preferred area. Front of thigh. Upper, outer side of thigh. Upper, outer side of arm. Upper, outer part of buttock. How to use an insulin pen Get ready Wash your hands with soap and water for at least 20 seconds. If soap and water are not available, use hand sanitizer. Test your blood sugar (glucose) level and write down that number. Follow any instructions from your health care provider about what to do if your blood glucose level is higher or lower than your normal range. Make sure the insulin pen has the right kind of insulin and there is enough insulin in the pen for the dose. Check the expiration date. If you are using CLEAR insulin, check to see that it is clear and free of clumps. If you are using CLOUDY insulin, mix it by gently rolling the insulin pen between your palms several times. Do not shake the pen. Remove the cap from the insulin pen. Use an alcohol wipe to clean the rubber tip of the pen. Remove the protective paper tab from the disposable needle. Do not let the needle touch anything. Screw a new, unused needle onto the  pen. Remove the outer plastic needle cover. Do not throw away the outer plastic cover yet. If the pen uses a special safety needle, leave the inner needle shield in place. If the pen does not use a special safety needle, remove the inner plastic cover from the needle. If you skip this step, you may not get the right amount of insulin. Follow the manufacturer's instructions to prime the insulin pen with the volume of insulin needed. Hold the pen with the needle pointing up, and push the button on the opposite end of the pen until a drop of insulin appears at the needle tip. If no insulin appears, repeat this step. Turn the button (dial) to the number of units of insulin that you will be injecting. Inject the insulin Use an alcohol wipe to clean the site where you will be inserting the needle. Let the site air-dry. Grip the base of the pen with a loose fist and rest your thumb on the pen or hold the pen in the palm of your writing hand like a pencil. If directed by your health care provider, use your other hand to pinch and hold about 1 inch (2.5 cm) of skin at the injection site. Do not directly touch the cleaned part of the skin. Gently but quickly, use your writing hand to put the needle straight into the skin. Insert the needle at a 45-degree angle or a 90-degree angle (perpendicular) to the skin, as directed by your health care  provider. When the needle is completely inserted into the skin, let go of the skin that you are pinching. Use your thumb or index finger of your writing hand to push the top button of the pen all the way to inject the insulin. Continue to hold the pen in place with your writing hand. Wait 10 seconds, then pull the needle straight out of the skin. This will allow all of the insulin to go from the pen and needle into your body. Carefully put the outer plastic cover of the needle back over the needle, then unscrew the capped needle and discard it in a sharps container, such as  an empty plastic bottle with a cover. Put the plastic cap back on the insulin pen. How to throw away supplies Discard all used needles in a sharps container. Follow the disposal regulations for the area where you live. Do not use any needle more than one time. Throw away empty disposable pens in the regular trash. Questions to ask your health care provider How often should I be taking insulin? How often should I check my blood glucose? What amount of insulin should I be taking each time? What are the side effects? What should I do if my blood glucose is too high? What should I do if my blood glucose is too low? What should I do if I forget to take my insulin? What number should I call if I have questions? Where to find more information American Diabetes Association (ADA): www.diabetes.org Association of Diabetes Care and Education Specialists (ADCES): www.diabeteseducator.org Summary Before you give yourself an insulin injection, be sure to wash your hands for at least 20 seconds and test your blood glucose level. Write down that number. Check the expiration date and the type of insulin that is in the pen. The type of insulin that you take may determine how many injections you give yourself and when you need to give the injections. It is best to inject insulin into the same body area each time; for example, always in the abdomen. However, you should use a different spot in that area for each injection. Do not use a needle more than one time. This information is not intended to replace advice given to you by your health care provider. Make sure you discuss any questions you have with your health care provider. Document Revised: 03/22/2020 Document Reviewed: 11/01/2019 Elsevier Patient Education  2022 [employer].

## 2021-03-14 DIAGNOSIS — Z139 Encounter for screening, unspecified: Secondary | ICD-10-CM

## 2021-03-14 NOTE — Congregational Nurse Program (Signed)
Scheduled appt for Care Connect and [place] for 3.29.23 at 3:00pm at the [place] as in office visit.  Care Connect application was pre-mailed on today (2.16.23) to bring to the appt along with reminder appt information sent by text message.   Pt stated understood all information discussed and call ended. Pt attended [place] to complete enrollment into  Connect program in addition to becoming established with a primary care provider.   States she last seen a PCP 2016 at [facility].    Chief Reasons Needed for  PCP Needingto complete a f/u visit per her recent discharge on 2.9.23  from [facility] and to receive disease and medication management for her diabetes and hypetension medical conditions   Past /Current Medical History Hx of hypertension, diabetes and does have enough medication per her release from hospital as it relates to a 30 day supply   Vital Signs Checked (see additional readings in vitals section of chart) BP=151/104 Blood Glucose Checked  (Nonfasting)- 164 HgA1C  Checked = 14%   -Pt educated on BP and Diabetes management, signs and symptoms to address to seek emergency medical treatment.   -Referral sent to RN Nurse Case Manager, Mayra Reel, for initial and continous medical case management upon completion of first medical appointment   Socio-determinants health needs identified: -On-going Medication Assistance resource needed    Appointment for first medical visit was scheduled for Tues, March 22, 2021 @ 9:45 am at the [facility] of [place].     Plan -  Enrollment completed Care Connect Program.  - Care Connect Eligibile (2.14./23 thru 2.14.24) - MedAssist Application Initiated  and emailed     (pending approval) Engineer, technical sales initiated and faxed on today  Late Entry Per Visit on 2.14.23   Pt attended [place]  to complete  enrollment into Connect program in addition to becoming established and scheduled with a primary care provider.  Pt last seen a PCP 2016 at [facility].    Chief Reasons Needed for  PCP Needing to complete a f/u visit per her recent discharge on 2.9.23  from [facility] and to receive disease and medication management for her diabetes and hypetension medical conditions.  States she received a 30 day of medications per [facility] assistance.     Past /Current Medical History Hx of hypertension and diabetes   Vital Signs Checked (see additional readings in vitals section of chart) BP=151/104 Blood Glucose Checked  (Nonfasting)- 164 HgA1C Checked = 14%   -Pt educated on BP and Diabetes management, signs and symptoms to address to seek emergency medical treatment.   -Referral sent to RN Nurse Case Manager, Mayra Reel, for initial and continous medical case management upon completion of first medical appointment   Socio-determinants health needs identified: -On-going Medication Assistance resource needed     Plan -  Enrollment completed [place] (2.14./23 thru 2.14.24) - MedAssist Application Initiated  and emailed     (pending approval) -[place] application initiated and faxed on today  -Scheduled Appointment for first medical visit was scheduled for Tues, Mar 08, 2021 @ 9:45 am at the [facility] of [place].

## 2021-03-22 ENCOUNTER — Ambulatory Visit: Payer: Self-pay | Admitting: Physician Assistant

## 2021-03-22 ENCOUNTER — Other Ambulatory Visit: Payer: Self-pay

## 2021-03-22 ENCOUNTER — Encounter: Payer: Self-pay | Admitting: Physician Assistant

## 2021-03-22 VITALS — BP 130/90 | HR 106 | Temp 98.6°F | Wt 155.0 lb

## 2021-03-22 DIAGNOSIS — R000 Tachycardia, unspecified: Secondary | ICD-10-CM

## 2021-03-22 DIAGNOSIS — F17200 Nicotine dependence, unspecified, uncomplicated: Secondary | ICD-10-CM

## 2021-03-22 DIAGNOSIS — F1910 Other psychoactive substance abuse, uncomplicated: Secondary | ICD-10-CM

## 2021-03-22 DIAGNOSIS — E1165 Type 2 diabetes mellitus with hyperglycemia: Secondary | ICD-10-CM

## 2021-03-22 DIAGNOSIS — I10 Essential (primary) hypertension: Secondary | ICD-10-CM

## 2021-03-22 NOTE — Progress Notes (Signed)
? ?BP 130/90   Pulse (!) 106   Temp 98.6 ?F (37 ?C)   Wt 155 lb (70.3 kg)   SpO2 98%   BMI 27.90 kg/m?   ? ?Subjective:  ? ? Patient ID: Megan Mullins, female    DOB: 02/28/1970, 51 y.o.   MRN: [id number] ? ?HPI: ?Megan Mullins is a 51 y.o. female presenting on 03/22/2021 for Diabetes and Hypertension ? ? ?HPI ? ?Chief Complaint  ?Patient presents with  ? Diabetes  ? Hypertension  ? ? ?Pt is 50yoF who was seen last month to establish care.  She says she is doing good with her insulin; she was started on lantus at her 03/08/21 appointment.  She says Her bs running 120, 130.   She forgot her bs log.   ? ?She says 320 was her highest and that was just because she just ate.  She says 157 highest fasting and Lowest was 120. ? ?She has been unable to get her labs drawn yet. ? ? ? ? ?Relevant past medical, surgical, family and social history reviewed and updated as indicated. Interim medical history since our last visit reviewed. ?Allergies and medications reviewed and updated. ? ? ?Current Outpatient Medications:  ?  folic acid (FOLVITE) 1 MG tablet, Take 1 tablet (1 mg total) by mouth daily., Disp: 30 tablet, Rfl: 1 ?  insulin glargine (LANTUS SOLOSTAR) 100 UNIT/ML Solostar Pen, Inject 10 Units into the skin daily., Disp: 15 mL, Rfl: PRN ?  losartan (COZAAR) 25 MG tablet, Take 1 tablet (25 mg total) by mouth daily., Disp: 30 tablet, Rfl: 1 ?  metFORMIN (GLUCOPHAGE) 1000 MG tablet, Take 1 tablet (1,000 mg total) by mouth 2 (two) times daily with a meal., Disp: 60 tablet, Rfl: 1 ?  metoprolol tartrate (LOPRESSOR) 50 MG tablet, Take 1 tablet (50 mg total) by mouth 2 (two) times daily., Disp: 60 tablet, Rfl: 0 ?  thiamine 100 MG tablet, Take 1 tablet (100 mg total) by mouth daily. (Patient not taking: Reported on 03/08/2021), Disp: 30 tablet, Rfl: 1 ? ? ? ?Review of Systems ? ?Per HPI unless specifically indicated above ? ?   ?Objective:  ?  ?BP 130/90   Pulse (!) 106   Temp 98.6 ?F (37 ?C)   Wt 155 lb (70.3 kg)   SpO2  98%   BMI 27.90 kg/m?   ?Wt Readings from Last 3 Encounters:  ?03/22/21 155 lb (70.3 kg)  ?03/01/21 171 lb 12.8 oz (77.9 kg)  ?03/08/21 171 lb (77.6 kg)  ?  ?Physical Exam ?Vitals reviewed.  ?Constitutional:   ?   General: She is not in acute distress. ?   Appearance: She is well-developed. She is not toxic-appearing.  ?HENT:  ?   Head: Normocephalic and atraumatic.  ?   Right Ear: Tympanic membrane, ear canal and external ear normal.  ?   Left Ear: Tympanic membrane, ear canal and external ear normal.  ?Cardiovascular:  ?   Rate and Rhythm: Normal rate and regular rhythm.  ?Pulmonary:  ?   Effort: Pulmonary effort is normal.  ?   Breath sounds: Normal breath sounds.  ?Abdominal:  ?   General: Bowel sounds are normal.  ?   Palpations: Abdomen is soft. There is no mass.  ?   Tenderness: There is no abdominal tenderness.  ?Musculoskeletal:  ?   Cervical back: Neck supple.  ?   Right lower leg: No edema.  ?   Left lower leg: No edema.  ?  Lymphadenopathy:  ?   Cervical: No cervical adenopathy.  ?Skin: ?   General: Skin is warm and dry.  ?Neurological:  ?   Mental Status: She is alert and oriented to person, place, and time.  ?Psychiatric:     ?   Behavior: Behavior normal.  ? ? ? ? ? ?   ?Assessment & Plan:  ? ?Encounter Diagnoses  ?Name Primary?  ? Uncontrolled type 2 diabetes mellitus with hyperglycemia (HCC) Yes  ? Primary hypertension   ? Tachycardia   ? Tobacco use disorder   ? Substance abuse (HCC)   ? ? ? ?-pt was given another  bs log ?-She picked up her insulin lantus that was ordered for her ?-she is encoruaged to get her labs drawn ?-pt to follow up here  1 month with bs log.  She is to contact office sooner prn ? ?

## 2021-04-25 ENCOUNTER — Other Ambulatory Visit: Payer: Self-pay | Admitting: Physician Assistant

## 2021-04-25 ENCOUNTER — Ambulatory Visit: Payer: Self-pay | Admitting: Physician Assistant

## 2021-05-11 ENCOUNTER — Telehealth: Payer: Self-pay

## 2021-05-11 NOTE — Telephone Encounter (Signed)
Client returned call today.  She states that this is the best number to reach her at right now.  ?Asked client to please call [facility] and talk to office manager Mariah Milling regarding her insulin and scheduling a follow up appointment. [facility] number provided and instructed client to leave a voicemail with her name and number if no one answers and the staff would return her call. ?She shares that her mother has been in the hospital and she is spending her time with her mother.  ?She does state she is taking her medications and she is checking her blood sugars.  ?Client was not able for a long conversation today, will plan to call client back again and she is agreeable.  ?Instructed client to call [facility] as soon as possible. ? ? ?Francee Nodal RN ?[employer] ?

## 2021-06-01 ENCOUNTER — Ambulatory Visit: Payer: Self-pay | Admitting: Physician Assistant

## 2021-06-08 ENCOUNTER — Emergency Department (HOSPITAL_COMMUNITY): Payer: Self-pay

## 2021-06-08 ENCOUNTER — Encounter (HOSPITAL_COMMUNITY): Payer: Self-pay | Admitting: Emergency Medicine

## 2021-06-08 ENCOUNTER — Inpatient Hospital Stay (HOSPITAL_COMMUNITY)
Admission: EM | Admit: 2021-06-08 | Discharge: 2021-06-13 | DRG: 871 | Disposition: A | Discharge status: Home / Self Care | Payer: Self-pay | Attending: Family Medicine | Admitting: Family Medicine

## 2021-06-08 ENCOUNTER — Other Ambulatory Visit: Payer: Self-pay

## 2021-06-08 DIAGNOSIS — Z794 Long term (current) use of insulin: Secondary | ICD-10-CM

## 2021-06-08 DIAGNOSIS — R6510 Systemic inflammatory response syndrome (SIRS) of non-infectious origin without acute organ dysfunction: Secondary | ICD-10-CM

## 2021-06-08 DIAGNOSIS — D509 Iron deficiency anemia, unspecified: Secondary | ICD-10-CM | POA: Diagnosis present

## 2021-06-08 DIAGNOSIS — A419 Sepsis, unspecified organism: Secondary | ICD-10-CM | POA: Diagnosis present

## 2021-06-08 DIAGNOSIS — N179 Acute kidney failure, unspecified: Secondary | ICD-10-CM | POA: Diagnosis present

## 2021-06-08 DIAGNOSIS — Z7984 Long term (current) use of oral hypoglycemic drugs: Secondary | ICD-10-CM

## 2021-06-08 DIAGNOSIS — R7401 Elevation of levels of liver transaminase levels: Secondary | ICD-10-CM | POA: Diagnosis not present

## 2021-06-08 DIAGNOSIS — I493 Ventricular premature depolarization: Secondary | ICD-10-CM | POA: Diagnosis not present

## 2021-06-08 DIAGNOSIS — I4719 Other supraventricular tachycardia: Secondary | ICD-10-CM | POA: Diagnosis present

## 2021-06-08 DIAGNOSIS — F17210 Nicotine dependence, cigarettes, uncomplicated: Secondary | ICD-10-CM | POA: Diagnosis present

## 2021-06-08 DIAGNOSIS — K7200 Acute and subacute hepatic failure without coma: Secondary | ICD-10-CM | POA: Diagnosis not present

## 2021-06-08 DIAGNOSIS — N136 Pyonephrosis: Secondary | ICD-10-CM | POA: Diagnosis present

## 2021-06-08 DIAGNOSIS — R6520 Severe sepsis without septic shock: Secondary | ICD-10-CM

## 2021-06-08 DIAGNOSIS — I471 Supraventricular tachycardia: Secondary | ICD-10-CM | POA: Diagnosis present

## 2021-06-08 DIAGNOSIS — Z79899 Other long term (current) drug therapy: Secondary | ICD-10-CM

## 2021-06-08 DIAGNOSIS — Z87891 Personal history of nicotine dependence: Secondary | ICD-10-CM

## 2021-06-08 DIAGNOSIS — I959 Hypotension, unspecified: Secondary | ICD-10-CM | POA: Diagnosis present

## 2021-06-08 DIAGNOSIS — A4151 Sepsis due to Escherichia coli [E. coli]: Principal | ICD-10-CM | POA: Diagnosis present

## 2021-06-08 DIAGNOSIS — Z8249 Family history of ischemic heart disease and other diseases of the circulatory system: Secondary | ICD-10-CM

## 2021-06-08 DIAGNOSIS — F1910 Other psychoactive substance abuse, uncomplicated: Secondary | ICD-10-CM | POA: Diagnosis present

## 2021-06-08 DIAGNOSIS — I10 Essential (primary) hypertension: Secondary | ICD-10-CM

## 2021-06-08 DIAGNOSIS — N10 Acute tubulo-interstitial nephritis: Secondary | ICD-10-CM | POA: Diagnosis present

## 2021-06-08 DIAGNOSIS — E1100 Type 2 diabetes mellitus with hyperosmolarity without nonketotic hyperglycemic-hyperosmolar coma (NKHHC): Secondary | ICD-10-CM | POA: Diagnosis present

## 2021-06-08 DIAGNOSIS — Z833 Family history of diabetes mellitus: Secondary | ICD-10-CM

## 2021-06-08 DIAGNOSIS — N12 Tubulo-interstitial nephritis, not specified as acute or chronic: Secondary | ICD-10-CM

## 2021-06-08 LAB — CBC WITH DIFFERENTIAL/PLATELET
Abs Immature Granulocytes: 0.07 10*3/uL (ref 0.00–0.07)
Basophils Absolute: 0 10*3/uL (ref 0.0–0.1)
Basophils Relative: 0 %
Eosinophils Absolute: 0 10*3/uL (ref 0.0–0.5)
Eosinophils Relative: 0 %
HCT: 32.8 % — ABNORMAL LOW (ref 36.0–46.0)
Hemoglobin: 10.4 g/dL — ABNORMAL LOW (ref 12.0–15.0)
Immature Granulocytes: 1 %
Lymphocytes Relative: 9 %
Lymphs Abs: 1.2 10*3/uL (ref 0.7–4.0)
MCH: 30.3 pg (ref 26.0–34.0)
MCHC: 31.7 g/dL (ref 30.0–36.0)
MCV: 95.6 fL (ref 80.0–100.0)
Monocytes Absolute: 0.5 10*3/uL (ref 0.1–1.0)
Monocytes Relative: 4 %
Neutro Abs: 11.5 10*3/uL — ABNORMAL HIGH (ref 1.7–7.7)
Neutrophils Relative %: 86 %
Platelets: 746 10*3/uL — ABNORMAL HIGH (ref 150–400)
RBC: 3.43 MIL/uL — ABNORMAL LOW (ref 3.87–5.11)
RDW: 15.2 % (ref 11.5–15.5)
WBC: 13.3 10*3/uL — ABNORMAL HIGH (ref 4.0–10.5)
nRBC: 0 % (ref 0.0–0.2)

## 2021-06-08 LAB — LACTIC ACID, PLASMA
Lactic Acid, Venous: 1.9 mmol/L (ref 0.5–1.9)
Lactic Acid, Venous: 2.2 mmol/L (ref 0.5–1.9)
Lactic Acid, Venous: 1.1 mmol/L (ref 0.5–1.9)

## 2021-06-08 LAB — URINALYSIS, ROUTINE W REFLEX MICROSCOPIC
Bilirubin Urine: NEGATIVE
Glucose, UA: NEGATIVE mg/dL
Ketones, ur: NEGATIVE mg/dL
Nitrite: POSITIVE — AB
Protein, ur: 100 mg/dL — AB
Specific Gravity, Urine: 1.005 (ref 1.005–1.030)
WBC, UA: >50 WBC/hpf — ABNORMAL HIGH (ref 0–5)
pH: 6 (ref 5.0–8.0)

## 2021-06-08 LAB — COMPREHENSIVE METABOLIC PANEL
ALT: 10 U/L (ref 0–44)
AST: 11 U/L — ABNORMAL LOW (ref 15–41)
Albumin: 3.4 g/dL — ABNORMAL LOW (ref 3.5–5.0)
Alkaline Phosphatase: 80 U/L (ref 38–126)
Anion gap: 9 (ref 5–15)
BUN: 12 mg/dL (ref 6–20)
CO2: 23 mmol/L (ref 22–32)
Calcium: 9.3 mg/dL (ref 8.9–10.3)
Chloride: 107 mmol/L (ref 98–111)
Creatinine, Ser: 1.38 mg/dL — ABNORMAL HIGH (ref 0.44–1.00)
GFR, Estimated: 47 mL/min — ABNORMAL LOW (ref >60)
Glucose, Bld: 170 mg/dL — ABNORMAL HIGH (ref 70–99)
Potassium: 3.9 mmol/L (ref 3.5–5.1)
Sodium: 139 mmol/L (ref 135–145)
Total Bilirubin: 0.2 mg/dL — ABNORMAL LOW (ref 0.3–1.2)
Total Protein: 8.9 g/dL — ABNORMAL HIGH (ref 6.5–8.1)

## 2021-06-08 LAB — PROTIME-INR
INR: 1.1 (ref 0.8–1.2)
Prothrombin Time: 13.8 seconds (ref 11.4–15.2)

## 2021-06-08 LAB — GLUCOSE, CAPILLARY
Glucose-Capillary: 126 mg/dL — ABNORMAL HIGH (ref 70–99)
Glucose-Capillary: 221 mg/dL — ABNORMAL HIGH (ref 70–99)

## 2021-06-08 LAB — MAGNESIUM: Magnesium: 1.5 mg/dL — ABNORMAL LOW (ref 1.7–2.4)

## 2021-06-08 LAB — MRSA NEXT GEN BY PCR, NASAL: MRSA by PCR Next Gen: NOT DETECTED

## 2021-06-08 LAB — POC URINE PREG, ED: Preg Test, Ur: NEGATIVE

## 2021-06-08 LAB — ETHANOL: Alcohol, Ethyl (B): <10 mg/dL (ref <10)

## 2021-06-08 LAB — TSH: TSH: 0.701 u[IU]/mL (ref 0.350–4.500)

## 2021-06-08 LAB — LIPASE, BLOOD: Lipase: 18 U/L (ref 11–51)

## 2021-06-08 LAB — APTT: aPTT: 32 seconds (ref 24–36)

## 2021-06-08 MED ORDER — MAGNESIUM SULFATE 2 GM/50ML IV SOLN
2.0000 g | Freq: Once | INTRAVENOUS | Status: AC
  Administered 2021-06-08: 2 g via INTRAVENOUS
  Filled 2021-06-08: qty 50

## 2021-06-08 MED ORDER — SODIUM CHLORIDE 0.9 % IV SOLN
1.0000 g | Freq: Once | INTRAVENOUS | Status: AC
  Administered 2021-06-08: 1 g via INTRAVENOUS
  Filled 2021-06-08: qty 10

## 2021-06-08 MED ORDER — INSULIN GLARGINE-YFGN 100 UNIT/ML ~~LOC~~ SOLN
10.0000 [IU] | Freq: Every day | SUBCUTANEOUS | Status: DC
Start: 2021-06-08 — End: 2021-06-13
  Administered 2021-06-08 – 2021-06-13 (×6): 10 [IU] via SUBCUTANEOUS
  Filled 2021-06-08 (×7): qty 0.1

## 2021-06-08 MED ORDER — KETOROLAC TROMETHAMINE 30 MG/ML IJ SOLN
30.0000 mg | Freq: Four times a day (QID) | INTRAMUSCULAR | Status: DC | PRN
  Administered 2021-06-08 – 2021-06-09 (×2): 30 mg via INTRAVENOUS
  Filled 2021-06-08: qty 1

## 2021-06-08 MED ORDER — HYDROMORPHONE HCL 1 MG/ML IJ SOLN: 0.5000 mg | INTRAMUSCULAR | Status: DC | PRN

## 2021-06-08 MED ORDER — ONDANSETRON HCL 4 MG PO TABS: 4.0000 mg | ORAL_TABLET | Freq: Four times a day (QID) | ORAL | Status: DC | PRN

## 2021-06-08 MED ORDER — SODIUM CHLORIDE 0.9% FLUSH: 3.0000 mL | INTRAVENOUS | Status: DC | PRN

## 2021-06-08 MED ORDER — ACETAMINOPHEN 325 MG PO TABS
650.0000 mg | ORAL_TABLET | Freq: Four times a day (QID) | ORAL | Status: DC | PRN
  Administered 2021-06-08 – 2021-06-09 (×2): 650 mg via ORAL
  Filled 2021-06-08 (×2): qty 2

## 2021-06-08 MED ORDER — ACETAMINOPHEN 650 MG RE SUPP: 650.0000 mg | Freq: Four times a day (QID) | RECTAL | Status: DC | PRN

## 2021-06-08 MED ORDER — LOSARTAN POTASSIUM 25 MG PO TABS
25.0000 mg | ORAL_TABLET | Freq: Every day | ORAL | Status: DC
Start: 2021-06-08 — End: 2021-06-09

## 2021-06-08 MED ORDER — HEPARIN SODIUM (PORCINE) 5000 UNIT/ML IJ SOLN
5000.0000 [IU] | Freq: Three times a day (TID) | INTRAMUSCULAR | Status: DC
  Administered 2021-06-08 – 2021-06-13 (×15): 5000 [IU] via SUBCUTANEOUS
  Filled 2021-06-08 (×15): qty 1

## 2021-06-08 MED ORDER — ALUM & MAG HYDROXIDE-SIMETH 200-200-20 MG/5ML PO SUSP
30.0000 mL | Freq: Once | ORAL | Status: AC
  Administered 2021-06-08: 30 mL via ORAL
  Filled 2021-06-08: qty 30

## 2021-06-08 MED ORDER — LACTATED RINGERS IV BOLUS (SEPSIS)
250.0000 mL | Freq: Once | INTRAVENOUS | Status: AC
  Administered 2021-06-08: 250 mL via INTRAVENOUS

## 2021-06-08 MED ORDER — SODIUM CHLORIDE 0.9% FLUSH
3.0000 mL | Freq: Two times a day (BID) | INTRAVENOUS | Status: DC
  Administered 2021-06-08 – 2021-06-13 (×9): 3 mL via INTRAVENOUS

## 2021-06-08 MED ORDER — MORPHINE SULFATE (PF) 4 MG/ML IV SOLN
4.0000 mg | Freq: Once | INTRAVENOUS | Status: AC
  Administered 2021-06-08: 4 mg via INTRAVENOUS
  Filled 2021-06-08: qty 1

## 2021-06-08 MED ORDER — INSULIN ASPART 100 UNIT/ML IJ SOLN
0.0000 [IU] | Freq: Three times a day (TID) | INTRAMUSCULAR | Status: DC
  Administered 2021-06-08: 1 [IU] via SUBCUTANEOUS
  Administered 2021-06-09: 2 [IU] via SUBCUTANEOUS
  Administered 2021-06-10 – 2021-06-12 (×2): 1 [IU] via SUBCUTANEOUS
  Administered 2021-06-12: 2 [IU] via SUBCUTANEOUS
  Administered 2021-06-12: 1 [IU] via SUBCUTANEOUS
  Administered 2021-06-13: 2 [IU] via SUBCUTANEOUS

## 2021-06-08 MED ORDER — SODIUM CHLORIDE 0.9 % IV SOLN
250.0000 mL | INTRAVENOUS | Status: DC | PRN
Start: 2021-06-08 — End: 2021-06-13

## 2021-06-08 MED ORDER — SODIUM CHLORIDE 0.9 % IV BOLUS
1000.0000 mL | Freq: Once | INTRAVENOUS | Status: AC
  Administered 2021-06-08: 1000 mL via INTRAVENOUS

## 2021-06-08 MED ORDER — KETOROLAC TROMETHAMINE 15 MG/ML IJ SOLN: 15.0000 mg | Freq: Four times a day (QID) | INTRAMUSCULAR | Status: DC | PRN

## 2021-06-08 MED ORDER — SODIUM CHLORIDE 0.9 % IV SOLN
INTRAVENOUS | Status: DC
  Administered 2021-06-08: 1000 mL via INTRAVENOUS

## 2021-06-08 MED ORDER — SENNOSIDES-DOCUSATE SODIUM 8.6-50 MG PO TABS: 1.0000 | ORAL_TABLET | Freq: Every evening | ORAL | Status: DC | PRN

## 2021-06-08 MED ORDER — IPRATROPIUM BROMIDE 0.02 % IN SOLN: 0.5000 mg | Freq: Four times a day (QID) | RESPIRATORY_TRACT | Status: DC | PRN

## 2021-06-08 MED ORDER — SODIUM CHLORIDE 0.9% FLUSH
3.0000 mL | Freq: Two times a day (BID) | INTRAVENOUS | Status: DC
  Administered 2021-06-08 – 2021-06-13 (×8): 3 mL via INTRAVENOUS

## 2021-06-08 MED ORDER — CHLORHEXIDINE GLUCONATE CLOTH 2 % EX PADS
6.0000 | MEDICATED_PAD | Freq: Every day | CUTANEOUS | Status: DC
  Administered 2021-06-09: 6 via TOPICAL

## 2021-06-08 MED ORDER — ACETAMINOPHEN 500 MG PO TABS
1000.0000 mg | ORAL_TABLET | Freq: Once | ORAL | Status: AC
  Administered 2021-06-08: 1000 mg via ORAL
  Filled 2021-06-08: qty 2

## 2021-06-08 MED ORDER — OXYCODONE HCL 5 MG PO TABS: 5.0000 mg | ORAL_TABLET | ORAL | Status: DC | PRN

## 2021-06-08 MED ORDER — LACTATED RINGERS IV BOLUS (SEPSIS)
1000.0000 mL | Freq: Once | INTRAVENOUS | Status: AC
  Administered 2021-06-08: 1000 mL via INTRAVENOUS

## 2021-06-08 MED ORDER — KETOROLAC TROMETHAMINE 30 MG/ML IJ SOLN
INTRAMUSCULAR | Status: AC
  Filled 2021-06-08: qty 1

## 2021-06-08 MED ORDER — BISACODYL 5 MG PO TBEC: 5.0000 mg | DELAYED_RELEASE_TABLET | Freq: Every day | ORAL | Status: DC | PRN

## 2021-06-08 MED ORDER — SODIUM CHLORIDE 0.9 % IV SOLN: INTRAVENOUS | Status: DC

## 2021-06-08 MED ORDER — TRAZODONE HCL 50 MG PO TABS
25.0000 mg | ORAL_TABLET | Freq: Every evening | ORAL | Status: DC | PRN
Start: 2021-06-08 — End: 2021-06-13

## 2021-06-08 MED ORDER — ONDANSETRON HCL 4 MG/2ML IJ SOLN
4.0000 mg | Freq: Once | INTRAMUSCULAR | Status: AC
  Administered 2021-06-08: 4 mg via INTRAVENOUS
  Filled 2021-06-08: qty 2

## 2021-06-08 MED ORDER — HYDRALAZINE HCL 20 MG/ML IJ SOLN
10.0000 mg | INTRAMUSCULAR | Status: DC | PRN
  Administered 2021-06-08: 10 mg via INTRAVENOUS
  Filled 2021-06-08: qty 1

## 2021-06-08 MED ORDER — METOPROLOL TARTRATE 5 MG/5ML IV SOLN
5.0000 mg | Freq: Once | INTRAVENOUS | Status: AC
  Administered 2021-06-08: 5 mg via INTRAVENOUS
  Filled 2021-06-08: qty 5

## 2021-06-08 MED ORDER — SODIUM CHLORIDE 0.9 % IV SOLN
2.0000 g | INTRAVENOUS | Status: DC
  Administered 2021-06-09: 2 g via INTRAVENOUS
  Filled 2021-06-08: qty 20

## 2021-06-08 MED ORDER — METOPROLOL TARTRATE 50 MG PO TABS
50.0000 mg | ORAL_TABLET | Freq: Two times a day (BID) | ORAL | Status: DC
Start: 2021-06-08 — End: 2021-06-09
  Administered 2021-06-08 – 2021-06-09 (×3): 50 mg via ORAL
  Filled 2021-06-08 (×3): qty 1

## 2021-06-08 MED ORDER — METOPROLOL TARTRATE 5 MG/5ML IV SOLN
5.0000 mg | Freq: Four times a day (QID) | INTRAVENOUS | Status: DC | PRN
  Administered 2021-06-08: 5 mg via INTRAVENOUS
  Filled 2021-06-08: qty 5

## 2021-06-08 MED ORDER — KETOROLAC TROMETHAMINE 15 MG/ML IJ SOLN
15.0000 mg | Freq: Once | INTRAMUSCULAR | Status: AC
  Administered 2021-06-08: 15 mg via INTRAVENOUS
  Filled 2021-06-08: qty 1

## 2021-06-08 MED ORDER — ONDANSETRON HCL 4 MG/2ML IJ SOLN: 4.0000 mg | Freq: Four times a day (QID) | INTRAMUSCULAR | Status: DC | PRN

## 2021-06-08 MED ORDER — LEVALBUTEROL HCL 0.63 MG/3ML IN NEBU: 0.6300 mg | INHALATION_SOLUTION | Freq: Four times a day (QID) | RESPIRATORY_TRACT | Status: DC | PRN

## 2021-06-08 NOTE — Assessment & Plan Note (Signed)
-   Hypertensive blood pressure as high as 198/121, currently 168/111 -Monitoring BP closely -in anticipation of hypotension due to SIRS, sepsis -As needed hydralazine  -Well resuming home medication of metoprolol, and losartan

## 2021-06-08 NOTE — Assessment & Plan Note (Signed)
-   Ventricular tachycardia noted on arrival EKG reading heart rate 150, PR 66, QRS duration 88, QTc 446, Sinus ventricular tachycardia with PVCs -Due to SIRS, ruling out sepsis -Continue IV fluid resuscitation, trend electrolytes, potassium 3.9, checking magnesium

## 2021-06-08 NOTE — Hospital Course (Addendum)
   Megan Mullins is a 51 year old female with a history of hypertension, diabetes mellitus type II, presenting fever, abdominal pain, nausea, vomiting... Worsening symptoms over the past 5 hours.   ED:  Blood pressure (!) 168/111, pulse (!) 145, temperature (!) 103.3 F (39.6 C), resp. rate (!) 23, height 5\' 1"  (1.549 m), weight 68 kg, SpO2 99 %. CBC WBC 13.3, hemoglobin 10.4, 11.5, CMP: Creatinine 1.38, blood glucose level 170, UA moderate hemoglobin, positive nitrites, large leukocyte esterase,> 50 WBCs  EKG: V. tach heart rate of 150, QTc 446,  CT abdomen/pelvis: IMPRESSION: 1. Mild right-sided hydronephrosis with urothelial thickening and perinephric/periureteric stranding. No renal, ureteral or bladder calculi identified. Findings may represent sequela of a recently passed stone versus ascending urinary tract infection. Correlate with  urinalysis. 2. 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 3.  Aortic Atherosclerosis   Patient was given 2 L, of normal saline, Toradol, Zofran, Maalox, Tylenol and IV Rocephin  Requested patient to be admitted for SIRS -ruling out sepsis, pyelonephritis

## 2021-06-08 NOTE — Assessment & Plan Note (Signed)
-   BUN 12, creatinine 1.38 -Mild AKI likely due to SIRS We will continue IV fluid hydration, avoiding nephrotoxins

## 2021-06-08 NOTE — Sepsis Progress Note (Signed)
Notified bedside nurse of need to draw lactic acid and blood cultures.  

## 2021-06-08 NOTE — H&P (Signed)
History and Physical   Patient: Megan Mullins                            PCP: Pcp, No                    DOB: 08-30-1970            DOA: 06/08/2021 WUJ:[id number]             DOS: 06/08/2021, 2:24 PM  Pcp, No  Patient coming from:   HOME  I have personally reviewed patient's medical records, in electronic medical records, including:  [facility], and care everywhere.    Chief Complaint:   Chief Complaint  Patient presents with   Abdominal Pain    History of present illness:      Megan Mullins is a 51 year old female with a history of hypertension, diabetes mellitus presenting fever, abdominal pain, nausea, vomiting... Worsening symptoms over the past 5 hours.       ED:  Blood pressure (!) 168/111, pulse (!) 145, temperature (!) 103.3 F (39.6 C), resp. rate (!) 23, height  (1.549 m), weight 68 kg, SpO2 99 %. CBC WBC 13.3, hemoglobin 10.4, 11.5, CMP: Creatinine 1.38, blood glucose level 170, UA moderate hemoglobin, positive nitrites, large leukocyte esterase,> 50 WBCs  EKG: V. tach heart rate of 150, QTc 446,  CT abdomen/pelvis: IMPRESSION: 1. Mild right-sided hydronephrosis with urothelial thickening and perinephric/periureteric stranding. No renal, ureteral or bladder calculi identified. Findings may represent sequela of a recently passed stone versus ascending urinary tract infection. Correlate with urinalysis. 2. 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 3.  Aortic Atherosclerosis   Patient was given 2 L, of normal saline, Toradol, Zofran, Maalox, Tylenol and IV Rocephin  Requested patient to be admitted for SIRS -ruling out sepsis, pyelonephritis     Patient Denies having: Fever, Chills, Cough, SOB, Chest Pain, Abd pain, N/V/D, headache, dizziness, lightheadedness,  Dysuria, Joint pain, rash, open wounds     Review of Systems: As per HPI, otherwise 10 point review of systems were negative.    ----------------------------------------------------------------------------------------------------------------------  No Known Allergies  Home MEDs:  Prior to Admission medications   Medication Sig Start Date End Date Taking? Authorizing Provider  insulin glargine (LANTUS SOLOSTAR) 100 UNIT/ML Solostar Pen Inject 10 Units into the skin daily. 03/08/21  Yes Jacquelin Hawking, PA-C  losartan (COZAAR) 25 MG tablet Take 1 tablet (25 mg total) by mouth daily. 02/23/21  Yes Noralee Stain, DO  metFORMIN (GLUCOPHAGE) 1000 MG tablet Take 1 tablet (1,000 mg total) by mouth 2 (two) times daily with a meal. 02/23/21  Yes Noralee Stain, DO  metoprolol tartrate (LOPRESSOR) 50 MG tablet Take 1 tablet (50 mg total) by mouth 2 (two) times daily. 03/08/21  Yes Jacquelin Hawking, PA-C    PRN MEDs: sodium chloride, acetaminophen **OR** acetaminophen, bisacodyl, hydrALAZINE, HYDROmorphone (DILAUDID) injection, ipratropium, ketorolac, levalbuterol, ondansetron **OR** ondansetron (ZOFRAN) IV, oxyCODONE, senna-docusate, sodium chloride flush, traZODone  Past Medical History:  Diagnosis Date   Diabetes mellitus without complication (HCC)     Past Surgical History:  Procedure Laterality Date   TUBAL LIGATION       reports that she has been smoking cigarettes. She has been smoking an average of .5 packs per day. She has never used smokeless tobacco. She reports that she does not currently use alcohol. She reports that she does not currently use drugs after having used the  following drugs: Marijuana and Cocaine.   Family History  Problem Relation Age of Onset   Heart disease Mother    Diabetes Mother     Physical Exam:   Vitals:   06/08/21 1326 06/08/21 1330 06/08/21 1400 06/08/21 1418  BP:  (!) 168/111 129/84   Pulse:  (!) 145 (!) 140   Resp:  (!) 23 16   Temp: (!) 103.3 F (39.6 C)   99.8 F (37.7 C)  TempSrc:      SpO2:  99% 100%   Weight:      Height:       Constitutional: NAD, calm,  comfortable Eyes: PERRL, lids and conjunctivae normal ENMT: Mucous membranes are moist. Posterior pharynx clear of any exudate or lesions.Normal dentition.  Neck: normal, supple, no masses, no thyromegaly Respiratory: clear to auscultation bilaterally, no wheezing, no crackles. Normal respiratory effort. No accessory muscle use.  Cardiovascular: Regular rate and rhythm, no murmurs / rubs / gallops. No extremity edema. 2+ pedal pulses. No carotid bruits.  Abdomen: no tenderness, no masses palpated. No hepatosplenomegaly. Bowel sounds positive.  Musculoskeletal: ++ Right CVA tenderness no clubbing / cyanosis. No joint deformity upper and lower extremities. Good ROM, no contractures. Normal muscle tone.  Neurologic: CN II-XII grossly intact. Sensation intact, DTR normal. Strength 5/5 in all 4.  Psychiatric: Normal judgment and insight. Alert and oriented x 3. Normal mood.  Skin: no rashes, lesions, ulcers. No induration Decubitus/ulcers:  Wounds: per nursing documentation         Labs on admission:    I have personally reviewed following labs and imaging studies  CBC: Recent Labs  Lab 06/08/21 0900  WBC 13.3*  NEUTROABS 11.5*  HGB 10.4*  HCT 32.8*  MCV 95.6  PLT 746*   Basic Metabolic Panel: Recent Labs  Lab 06/08/21 0900  NA 139  K 3.9  CL 107  CO2 23  GLUCOSE 170*  BUN 12  CREATININE 1.38*  CALCIUM 9.3   GFR: Estimated Creatinine Clearance: 44 mL/min (A) (by C-G formula based on SCr of 1.38 mg/dL (H)). Liver Function Tests: Recent Labs  Lab 06/08/21 0900  AST 11*  ALT 10  ALKPHOS 80  BILITOT 0.2*  PROT 8.9*  ALBUMIN 3.4*   Recent Labs  Lab 06/08/21 0900  LIPASE 18       Component Value Date/Time   COLORURINE YELLOW 06/08/2021 0809   APPEARANCEUR CLOUDY (A) 06/08/2021 0809   LABSPEC 1.005 06/08/2021 0809   PHURINE 6.0 06/08/2021 0809   GLUCOSEU NEGATIVE 06/08/2021 0809   HGBUR MODERATE (A) 06/08/2021 0809   BILIRUBINUR NEGATIVE 06/08/2021 0809    KETONESUR NEGATIVE 06/08/2021 0809   PROTEINUR 100 (A) 06/08/2021 0809   UROBILINOGEN 0.2 03/13/2014 1940   NITRITE POSITIVE (A) 06/08/2021 0809   LEUKOCYTESUR LARGE (A) 06/08/2021 0809    Last A1C:  Lab Results  Component Value Date   HGBA1C 14% 03/01/2021     Radiologic Exams on Admission:   CT Abdomen Pelvis Wo Contrast  Result Date: 06/08/2021 CLINICAL DATA:  Abdominal pain with constipation since 01/30 today EXAM: CT ABDOMEN AND PELVIS WITHOUT CONTRAST TECHNIQUE: Multidetector CT imaging of the abdomen and pelvis was performed following the standard protocol without IV contrast. RADIATION DOSE REDUCTION: This exam was performed according to the departmental dose-optimization program which includes automated exposure control, adjustment of the mA and/or kV according to patient size and/or use of iterative reconstruction technique. COMPARISON:  None Available. FINDINGS: Lower chest: No acute abnormality. Hepatobiliary: Unremarkable noncontrast appearance  of the hepatic parenchyma. Gallbladder is unremarkable. No biliary ductal dilation. Pancreas: No pancreatic ductal dilation or evidence of acute inflammation. Spleen: No splenomegaly or focal splenic lesion. Adrenals/Urinary Tract: Bilateral adrenal glands are within normal limits. Mild right-sided hydronephrosis with urothelial thickening and perinephric/periureteric stranding. Mild left perinephric stranding. No renal, ureteral or bladder calculi identified. Urinary bladder is unremarkable for degree of distension. Stomach/Bowel: Radiopaque enteric contrast material traverses distal loops of small bowel. Stomach is unremarkable for degree of distension. No pathologic dilation of small or large bowel. The appendix and terminal ileum appear normal. No evidence of acute bowel inflammation. Vascular/Lymphatic: Aortic and branch vessel atherosclerosis without abdominal aortic aneurysm. No pathologically enlarged abdominal or pelvic lymph nodes.  Reproductive: The uterus and adnexa are unremarkable in noncontrast CT appearance for a premenopausal female. Other: Trace pelvic free fluid is within physiologic normal limits. Musculoskeletal: Mild thoracolumbar spondylosis. 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 IMPRESSION: 1. Mild right-sided hydronephrosis with urothelial thickening and perinephric/periureteric stranding. No renal, ureteral or bladder calculi identified. Findings may represent sequela of a recently passed stone versus ascending urinary tract infection. Correlate with urinalysis. 2. 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 3.  Aortic Atherosclerosis (ICD10-I70.0). Electronically Signed   By: Maudry MayhewJeffrey  Waltz M.D.   On: 06/08/2021 12:08     EKG:   Independently reviewed.  Orders placed or performed during the hospital encounter of 06/08/21   EKG 12-Lead   EKG 12-Lead   EKG 12-Lead   ---------------------------------------------------------------------------------------------------------------------------------------    Assessment / Plan:   Principal Problem:   Sepsis (HCC) Active Problems:   Acute pyelonephritis   Type 2 diabetes mellitus with hyperosmolar nonketotic hyperglycemia (HCC)   Multifocal atrial tachycardia (HCC)   HTN (hypertension)   AKI (acute kidney injury) (HCC)   Assessment and Plan: * Sepsis (HCC) - Per arrival meeting SIRS criteria, ruling out sepsis BP(!) 168/111, pulse (!) 145, TMAX  (!) 103.3 F (39.6 C), resp. rate (!) 23, height 5\' 1"  (1.549 m), weight 68 kg, SpO2 99 %.  -Likely source UTI, pyelonephritis -Initiated on sepsis protocol, IV fluid resuscitation, broad-spectrum antibiotics of iv Rocephin 2 g daily -Blood and urine cultures obtained --will follow accordingly  -CT abdomen pelvis reviewed: Revealing mild right-sided hydronephrosis  -WBC 13.3, lactic acid  Acute pyelonephritis - CT abdomen pelvis reviewed:  IMPRESSION: 1.  Mild right-sided hydronephrosis with urothelial thickening and perinephric/periureteric stranding. No renal, ureteral or bladder calculi identified. Findings may represent sequela of a recently passed stone versus ascending urinary tract infection. Correlate with urinalysis. 2. 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 3.  Aortic Atherosclerosis  -Continue treatment per sepsis protocol IV fluids, IV antibiotics, Nausea note closely,  Multifocal atrial tachycardia (HCC) - Ventricular tachycardia noted on arrival EKG reading heart rate 150, PR 66, QRS duration 88, QTc 446, Sinus ventricular tachycardia with PVCs -Due to SIRS, ruling out sepsis -Continue IV fluid resuscitation, trend electrolytes, potassium 3.9, checking magnesium  Type 2 diabetes mellitus with hyperosmolar nonketotic hyperglycemia (HCC) - Home medication reviewed, long-acting insulin 10 units nightly, metformin 1 g twice daily - Holding home medication of metformin, -Resuming long-acting insulin (substituting for formulary) -Checking blood sugar QA CHS, with SSI coverage -A1c:   HTN (hypertension) - Hypertensive blood pressure as high as 198/121, currently 168/111 -Monitoring BP closely -in anticipation of hypotension due to SIRS, sepsis -As needed hydralazine  -Well resuming home medication of metoprolol, and losartan   AKI (acute kidney injury) (HCC) - BUN 12, creatinine 1.38 -Mild AKI  likely due to SIRS We will continue IV fluid hydration, avoiding nephrotoxins      Consults called:  None -------------------------------------------------------------------------------------------------------------------------------------------- DVT prophylaxis:  heparin injection 5,000 Units Start: 06/08/21 1400 TED hose Start: 06/08/21 1354 SCDs Start: 06/08/21 1354   Code Status:   Code Status: Full Code   Admission status: Patient will be admitted as Inpatient, with a greater than 2 midnight  length of stay. Level of care: Telemetry   Family Communication:  none at bedside  (The above findings and plan of care has been discussed with patient in detail, the patient expressed understanding and agreement of above plan)  --------------------------------------------------------------------------------------------------------------------------------------------------  Disposition Plan:  Anticipated 1-2 days Status is: Inpatient Remains inpatient appropriate because: Meeting SIRS criteria, ruling out sepsis, needing IV fluid resuscitation, IV antibiotics close monitoring  -----------------------------------------------------------------------------------------------------------------------------------  Time spent: > than  55  Min.   SIGNED: Kendell Bane, MD, FHM. [facility],  Pager (Please use [email or web address] to page to text)  If 7PM-7AM, please contact night-coverage [email or web address],  06/08/2021, 2:24 PM

## 2021-06-08 NOTE — Assessment & Plan Note (Addendum)
-   Per arrival meeting SIRS criteria, ruling out sepsis BP(!) 168/111, pulse (!) 145, TMAX  (!) 103.3 F (39.6 C), resp. rate (!) 23, height 5\' 1"  (1.549 m), weight 68 kg, SpO2 99 %.  -Likely source UTI, pyelonephritis -Initiated on sepsis protocol, IV fluid resuscitation, broad-spectrum antibiotics of iv Rocephin 2 g daily -Blood and urine cultures obtained --will follow accordingly  -CT abdomen pelvis reviewed: Revealing mild right-sided hydronephrosis  -WBC 13.3, lactic acid

## 2021-06-08 NOTE — ED Provider Notes (Signed)
[facility] EMERGENCY DEPARTMENT Provider Note   CSN: [id number] Arrival date & time: 06/08/21  0740     History  Chief Complaint  Patient presents with   Abdominal Pain    Megan Mullins is a 51 y.o. female.  HPI Patient presents with abdominal pain, nausea, vomiting.  Onset was 5 hours ago.  Since that time she has had persistent nausea, vomiting, inability to tolerate oral. No bowel movements in about 3 days. She states that she was well prior to the onset of symptoms, though she has a history of hypertension and diabetes. No history of abdominal surgery.    Home Medications Prior to Admission medications   Medication Sig Start Date End Date Taking? Authorizing Provider  insulin glargine (LANTUS SOLOSTAR) 100 UNIT/ML Solostar Pen Inject 10 Units into the skin daily. 03/08/21  Yes Soyla Dryer, PA-C  losartan (COZAAR) 25 MG tablet Take 1 tablet (25 mg total) by mouth daily. 02/23/21  Yes Dessa Phi, DO  metFORMIN (GLUCOPHAGE) 1000 MG tablet Take 1 tablet (1,000 mg total) by mouth 2 (two) times daily with a meal. 02/23/21  Yes Dessa Phi, DO  metoprolol tartrate (LOPRESSOR) 50 MG tablet Take 1 tablet (50 mg total) by mouth 2 (two) times daily. 03/08/21  Yes Soyla Dryer, PA-C      Allergies    Patient has no known allergies.    Review of Systems   Review of Systems  All other systems reviewed and are negative.  Physical Exam Updated Vital Signs BP (!) 187/94   Pulse (!) 162   Temp (!) 101.1 F (38.4 C)   Resp (!) 31   Ht _0  (1.549 m)   Wt 68 kg   SpO2 (!) 88%   BMI 28.34 kg/m  Physical Exam Vitals and nursing note reviewed.  Constitutional:      General: She is not in acute distress.    Appearance: She is well-developed. She is ill-appearing. She is not diaphoretic.  HENT:     Head: Normocephalic and atraumatic.  Eyes:     Conjunctiva/sclera: Conjunctivae normal.  Cardiovascular:     Rate and Rhythm: Regular rhythm. Tachycardia present.   Pulmonary:     Effort: Pulmonary effort is normal. No respiratory distress.     Breath sounds: Normal breath sounds. No stridor.  Abdominal:     General: There is no distension.     Tenderness: There is abdominal tenderness in the epigastric area.  Skin:    General: Skin is warm and dry.  Neurological:     Mental Status: She is alert and oriented to person, place, and time.     Cranial Nerves: No cranial nerve deficit.  Psychiatric:        Mood and Affect: Mood normal.    ED Results / Procedures / Treatments   Labs (all labs ordered are listed, but only abnormal results are displayed) Labs Reviewed  COMPREHENSIVE METABOLIC PANEL - Abnormal; Notable for the following components:      Result Value   Glucose, Bld 170 (*)    Creatinine, Ser 1.38 (*)    Total Protein 8.9 (*)    Albumin 3.4 (*)    AST 11 (*)    Total Bilirubin 0.2 (*)    GFR, Estimated 47 (*)    All other components within normal limits  CBC WITH DIFFERENTIAL/PLATELET - Abnormal; Notable for the following components:   WBC 13.3 (*)    RBC 3.43 (*)    Hemoglobin 10.4 (*)  HCT 32.8 (*)    Platelets 746 (*)    Neutro Abs 11.5 (*)    All other components within normal limits  URINALYSIS, ROUTINE W REFLEX MICROSCOPIC - Abnormal; Notable for the following components:   APPearance CLOUDY (*)    Hgb urine dipstick MODERATE (*)    Protein, ur 100 (*)    Nitrite POSITIVE (*)    Leukocytes,Ua LARGE (*)    WBC, UA >50 (*)    Bacteria, UA RARE (*)    All other components within normal limits  LIPASE, BLOOD  ETHANOL  POC URINE PREG, ED    EKG EKG Interpretation  Date/Time:  Wednesday Jun 08 2021 12:48:11 EDT Ventricular Rate:  150 PR Interval:  66 QRS Duration: 88 QT Interval:  282 QTC Calculation: 446 R Axis:   84 Text Interpretation: Sinus tachycardia Ventricular premature complex Aberrant complex Borderline repolarization abnormality Abnormal ECG Confirmed by Carmin Muskrat [phone]) on 06/08/2021  1:09:35 PM  Radiology CT Abdomen Pelvis Wo Contrast  Result Date: 06/08/2021 CLINICAL DATA:  Abdominal pain with constipation since 01/30 today EXAM: CT ABDOMEN AND PELVIS WITHOUT CONTRAST TECHNIQUE: Multidetector CT imaging of the abdomen and pelvis was performed following the standard protocol without IV contrast. RADIATION DOSE REDUCTION: This exam was performed according to the departmental dose-optimization program which includes automated exposure control, adjustment of the mA and/or kV according to patient size and/or use of iterative reconstruction technique. COMPARISON:  None Available. FINDINGS: Lower chest: No acute abnormality. Hepatobiliary: Unremarkable noncontrast appearance of the hepatic parenchyma. Gallbladder is unremarkable. No biliary ductal dilation. Pancreas: No pancreatic ductal dilation or evidence of acute inflammation. Spleen: No splenomegaly or focal splenic lesion. Adrenals/Urinary Tract: Bilateral adrenal glands are within normal limits. Mild right-sided hydronephrosis with urothelial thickening and perinephric/periureteric stranding. Mild left perinephric stranding. No renal, ureteral or bladder calculi identified. Urinary bladder is unremarkable for degree of distension. Stomach/Bowel: Radiopaque enteric contrast material traverses distal loops of small bowel. Stomach is unremarkable for degree of distension. No pathologic dilation of small or large bowel. The appendix and terminal ileum appear normal. No evidence of acute bowel inflammation. Vascular/Lymphatic: Aortic and branch vessel atherosclerosis without abdominal aortic aneurysm. No pathologically enlarged abdominal or pelvic lymph nodes. Reproductive: The uterus and adnexa are unremarkable in noncontrast CT appearance for a premenopausal female. Other: Trace pelvic free fluid is within physiologic normal limits. Musculoskeletal: Mild thoracolumbar spondylosis. Marked degenerative change of the left-greater-than-right  hips. IMPRESSION: 1. Mild right-sided hydronephrosis with urothelial thickening and perinephric/periureteric stranding. No renal, ureteral or bladder calculi identified. Findings may represent sequela of a recently passed stone versus ascending urinary tract infection. Correlate with urinalysis. 2. 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 3.  Aortic Atherosclerosis (ICD10-I70.0). Electronically Signed   By: Dahlia Bailiff M.D.   On: 06/08/2021 12:08    Procedures Procedures    Medications Ordered in ED Medications  sodium chloride 0.9 % bolus 1,000 mL (1,000 mLs Intravenous New Bag/Given 06/08/21 0911)    And  0.9 %  sodium chloride infusion (has no administration in time range)  ketorolac (TORADOL) 15 MG/ML injection 15 mg (has no administration in time range)  morphine (PF) 4 MG/ML injection 4 mg (4 mg Intravenous Given 06/08/21 0915)  ondansetron (ZOFRAN) injection 4 mg (4 mg Intravenous Given 06/08/21 0912)  alum & mag hydroxide-simeth (MAALOX/MYLANTA) 200-200-20 MG/5ML suspension 30 mL (30 mLs Oral Given 06/08/21 0915)  sodium chloride 0.9 % bolus 1,000 mL (1,000 mLs Intravenous New Bag/Given 06/08/21 1138)  acetaminophen (TYLENOL)  tablet 1,000 mg (1,000 mg Oral Given 06/08/21 1208)  cefTRIAXone (ROCEPHIN) 1 g in sodium chloride 0.9 % 100 mL IVPB (0 g Intravenous Stopped 06/08/21 1253)    ED Course/ Medical Decision Making/ A&P This patient with a Hx of diabetes, hypertension, no abdominal surgery presents to the ED for concern of abdominal pain, nausea, vomiting, this involves an extensive number of treatment options, and is a complaint that carries with it a high risk of complications and morbidity.    The differential diagnosis includes bowel obstruction, gastritis, diverticulitis, colitis, pancreatitis   Social Determinants of Health:  Substance abuse history, alcohol abuse  Additional history obtained:  Additional history and/or information obtained from  chart review, notable for history of renal dysfunction, as well as alcohol abuse with ongoing clinic visits   After the initial evaluation, orders, including: CT labs morphine fluids Zofran were initiated.   Patient placed on Cardiac and Pulse-Oximetry Monitors. The patient was maintained on a cardiac monitor.  The cardiac monitored showed an rhythm of 115 sinus tach abnormal The patient was also maintained on pulse oximetry. The readings were typically 100% room air normal   On repeat evaluation of the patient stayed the same  Lab Tests:  I personally interpreted labs.  The pertinent results include: Leukocytosis, creatinine 1.4, criteria for acute kidney injury met as well as urinary tract infection  Imaging Studies ordered:  I independently visualized and interpreted imaging which showed inflammatory changes, right kidney I agree with the radiologist interpretation   Dispostion / Final MDM:  After consideration of the diagnostic results and the patient's response to treatment, patient will require admission.  This adult female presents with abdominal pain, is found to be febrile, tachycardic, tachypneic, meets SIRS criteria.  Patient is mentating appropriately, blood pressure is elevated, recent there is no evidence for septic shock.  Patient had resuscitation with fluids, ceftriaxone, Tylenol, Toradol, required admission for monitoring, management.  Final Clinical Impression(s) / ED Diagnoses Final diagnoses:  SIRS (systemic inflammatory response syndrome) ([place])  Pyelonephritis  AKI (acute kidney injury) ([place])  CRITICAL CARE Performed by: Carmin Muskrat Total critical care time: 35 minutes Critical care time was exclusive of separately billable procedures and treating other patients. Critical care was necessary to treat or prevent imminent or life-threatening deterioration. Critical care was time spent personally by me on the following activities: development of treatment  plan with patient and/or surrogate as well as nursing, discussions with consultants, evaluation of patient's response to treatment, examination of patient, obtaining history from patient or surrogate, ordering and performing treatments and interventions, ordering and review of laboratory studies, ordering and review of radiographic studies, pulse oximetry and re-evaluation of patient's condition.    Carmin Muskrat, MD 06/08/21 1311

## 2021-06-08 NOTE — ED Notes (Signed)
Pt is aware that a urine sample is needed.  

## 2021-06-08 NOTE — Progress Notes (Signed)
Reported to MD and this RN that she last used cocaine (smoke) & drank alcohol x 3 days ago

## 2021-06-08 NOTE — Assessment & Plan Note (Signed)
-   Home medication reviewed, long-acting insulin 10 units nightly, metformin 1 g twice daily - Holding home medication of metformin, -Resuming long-acting insulin (substituting for formulary) -Checking blood sugar QA CHS, with SSI coverage -A1c:

## 2021-06-08 NOTE — ED Notes (Signed)
Pt placed on 2 lpm o2 via n/c for o2 sat 88-89.  O2 sats increased to 93.  Spoke with dr Jeraldine Loots and informed hime of pt increased hr.  Pt cont to have temp of 101.1.

## 2021-06-08 NOTE — ED Triage Notes (Signed)
Pt presents via [facility] for c./o of abdominal pain with constipation, since 130 am.

## 2021-06-08 NOTE — Sepsis Progress Note (Signed)
Code sepsis protocol being monitored by eLink. Code sepsis start time is 1356. Prior to code sepsis call, 1L NS administered at 0911 and  another at 1138. Rocephin given at 1213.

## 2021-06-08 NOTE — Assessment & Plan Note (Signed)
-   CT abdomen pelvis reviewed:  IMPRESSION: 1. Mild right-sided hydronephrosis with urothelial thickening and perinephric/periureteric stranding. No renal, ureteral or bladder calculi identified. Findings may represent sequela of a recently passed stone versus ascending urinary tract infection. Correlate with urinalysis. 2. 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 3.  Aortic Atherosclerosis  -Continue treatment per sepsis protocol IV fluids, IV antibiotics, Nausea note closely,

## 2021-06-08 NOTE — Progress Notes (Signed)
Temp 103.6 PRN Tylenol given, BP elevated SBP > 160 PRN Hydralazine given, HR 140s at this time, rigors noted but have stopped at this time, patient denies pain, MD notified

## 2021-06-09 DIAGNOSIS — F1910 Other psychoactive substance abuse, uncomplicated: Secondary | ICD-10-CM

## 2021-06-09 DIAGNOSIS — N179 Acute kidney failure, unspecified: Secondary | ICD-10-CM

## 2021-06-09 LAB — COMPREHENSIVE METABOLIC PANEL
ALT: 13 U/L (ref 0–44)
AST: 18 U/L (ref 15–41)
Albumin: 2.4 g/dL — ABNORMAL LOW (ref 3.5–5.0)
Alkaline Phosphatase: 108 U/L (ref 38–126)
Anion gap: 6 (ref 5–15)
BUN: 17 mg/dL (ref 6–20)
CO2: 22 mmol/L (ref 22–32)
Calcium: 8 mg/dL — ABNORMAL LOW (ref 8.9–10.3)
Chloride: 108 mmol/L (ref 98–111)
Creatinine, Ser: 1.71 mg/dL — ABNORMAL HIGH (ref 0.44–1.00)
GFR, Estimated: 37 mL/min — ABNORMAL LOW (ref >60)
Glucose, Bld: 187 mg/dL — ABNORMAL HIGH (ref 70–99)
Potassium: 4.8 mmol/L (ref 3.5–5.1)
Sodium: 136 mmol/L (ref 135–145)
Total Bilirubin: 0.3 mg/dL (ref 0.3–1.2)
Total Protein: 6.8 g/dL (ref 6.5–8.1)

## 2021-06-09 LAB — CBC
HCT: 29.2 % — ABNORMAL LOW (ref 36.0–46.0)
Hemoglobin: 9 g/dL — ABNORMAL LOW (ref 12.0–15.0)
MCH: 30.1 pg (ref 26.0–34.0)
MCHC: 30.8 g/dL (ref 30.0–36.0)
MCV: 97.7 fL (ref 80.0–100.0)
Platelets: 554 10*3/uL — ABNORMAL HIGH (ref 150–400)
RBC: 2.99 MIL/uL — ABNORMAL LOW (ref 3.87–5.11)
RDW: 15.5 % (ref 11.5–15.5)
WBC: 19.8 10*3/uL — ABNORMAL HIGH (ref 4.0–10.5)
nRBC: 0 % (ref 0.0–0.2)

## 2021-06-09 LAB — HIV ANTIBODY (ROUTINE TESTING W REFLEX): HIV Screen 4th Generation wRfx: NONREACTIVE

## 2021-06-09 LAB — GLUCOSE, CAPILLARY
Glucose-Capillary: 181 mg/dL — ABNORMAL HIGH (ref 70–99)
Glucose-Capillary: 118 mg/dL — ABNORMAL HIGH (ref 70–99)
Glucose-Capillary: 87 mg/dL (ref 70–99)
Glucose-Capillary: 101 mg/dL — ABNORMAL HIGH (ref 70–99)

## 2021-06-09 MED ORDER — LACTATED RINGERS IV SOLN: INTRAVENOUS | Status: DC

## 2021-06-09 MED ORDER — RISAQUAD PO CAPS
1.0000 | ORAL_CAPSULE | Freq: Three times a day (TID) | ORAL | Status: DC
  Administered 2021-06-09 – 2021-06-13 (×12): 1 via ORAL
  Filled 2021-06-09 (×12): qty 1

## 2021-06-09 MED ORDER — ALPRAZOLAM 0.5 MG PO TABS: 0.5000 mg | ORAL_TABLET | Freq: Three times a day (TID) | ORAL | Status: DC | PRN

## 2021-06-09 MED ORDER — METOPROLOL TARTRATE 50 MG PO TABS
100.0000 mg | ORAL_TABLET | Freq: Two times a day (BID) | ORAL | Status: DC
  Administered 2021-06-09 – 2021-06-13 (×8): 100 mg via ORAL
  Filled 2021-06-09 (×8): qty 2

## 2021-06-09 NOTE — Assessment & Plan Note (Addendum)
-   Status post IV  2 g of magnesium

## 2021-06-09 NOTE — Progress Notes (Signed)
Upon admission to the floor, pt presented with a 102.9 temp. Pt turned red mews. PRN Tylenol given. MD aware, no new orders at this time. Charge nurse notified. Pt temp is currently 98.3 and is resting comfortably in bed. Will continue to monitor.

## 2021-06-09 NOTE — Evaluation (Signed)
Occupational Therapy Evaluation Patient Details Name: Megan Mullins MRN: [id number] DOB: 04/18/1970 Today's Date: 06/09/2021   History of Present Illness Megan Mullins is a 51 year old female with a history of hypertension, diabetes mellitus presenting fever, abdominal pain, nausea, vomiting... Worsening symptoms over the past 5 hours. (Per MD)   Clinical Impression   Pt agreeable to OT and PT co-evaluation. Pt appears to be at or near baseline levels for mobility and ADL's. Pt able to ambulate in room and hall without assist. Pt able to complete bed mobility and demonstrates WFL B UE strength and ROM. Pt is nor recommended for further acute OT services and will be discharged to care of nursing staff for remaining length of stay.      Recommendations for follow up therapy are one component of a multi-disciplinary discharge planning process, led by the attending physician.  Recommendations may be updated based on patient status, additional functional criteria and insurance authorization.   Follow Up Recommendations  No OT follow up    Assistance Recommended at Discharge None  Patient can return home with the following      Functional Status Assessment  Patient has not had a recent decline in their functional status  Equipment Recommendations  None recommended by OT    Recommendations for Other Services       Precautions / Restrictions Precautions Precautions: None Restrictions Weight Bearing Restrictions: No      Mobility Bed Mobility Overal bed mobility: Independent                  Transfers Overall transfer level: Independent                        Balance Overall balance assessment: Mild deficits observed, not formally tested                                         ADL either performed or assessed with clinical judgement   ADL                                               Vision Baseline Vision/History: 1  Wears glasses Ability to See in Adequate Light: 1 Impaired Patient Visual Report: No change from baseline Vision Assessment?: No apparent visual deficits                Pertinent Vitals/Pain Pain Assessment Pain Assessment: 0-10 Pain Score: 4  Pain Location: L torso area Pain Descriptors / Indicators: Aching Pain Intervention(s): Limited activity within patient's tolerance, Monitored during session, Repositioned     Hand Dominance Right   Extremity/Trunk Assessment Upper Extremity Assessment Upper Extremity Assessment: Overall WFL for tasks assessed   Lower Extremity Assessment Lower Extremity Assessment: Defer to PT evaluation   Cervical / Trunk Assessment Cervical / Trunk Assessment: Normal   Communication Communication Communication: No difficulties   Cognition Arousal/Alertness: Awake/alert Behavior During Therapy: WFL for tasks assessed/performed Overall Cognitive Status: Within Functional Limits for tasks assessed  Home Living Family/patient expects to be discharged to:: Private residence Living Arrangements: Parent Available Help at Discharge: Family;Available 24 hours/day Type of Home: House Home Access: Ramped entrance     Home Layout: One level     Bathroom Shower/Tub: Chief Strategy Officer: Standard     Home Equipment: None   Additional Comments: Pt reported no change in history from previous admission.      Prior Functioning/Environment Prior Level of Function : Independent/Modified Independent             Mobility Comments: Community ambulator without AD, does not drive ADLs Comments: Independent        OT Problem List:        OT Treatment/Interventions:      OT Goals(Current goals can be found in the care plan section) Acute Rehab OT Goals Patient Stated Goal: return home  OT Frequency:      Co-evaluation PT/OT/SLP  Co-Evaluation/Treatment: Yes Reason for Co-Treatment: To address functional/ADL transfers   OT goals addressed during session: ADL's and self-care                       End of Session Nurse Communication:  (notified pt did well)  Activity Tolerance: Patient tolerated treatment well Patient left: in chair;with call bell/phone within reach  OT Visit Diagnosis: Pain;Unsteadiness on feet (R26.81) Pain - part of body:  (abdominal)                Time: 6962-9528 OT Time Calculation (min): 8 min Charges:  OT General Charges $OT Visit: 1 Visit OT Evaluation $OT Eval Low Complexity: 1 Low  Tavin Vernet OT, MOT  Danie Chandler 06/09/2021, 10:09 AM

## 2021-06-09 NOTE — Assessment & Plan Note (Addendum)
-  Sepsis physiology resolved Blood pressure (!) 134/94, pulse 95, temperature 98.3 F (36.8 C), resp. rate 18, height 5\' 1"  (1.549 m), weight 79.2 kg, SpO2 100 %.  WBC: 13.3, 19.8, 14.8 today -Lactic acid 1.1, 0.2, 1.9   - On arrival meeting SIRS criteria, BP(!) 168/111, pulse (!) 145, TMAX  (!) 103.3 F (39.6 C), resp. rate (!) 23, height 5\' 1"  (1.549 m), weight 68 kg, SpO2 99 %.  -Likely source UTI, pyelonephritis -Urine culture growing E. coli, ESBL,  Biotics switched from Rocephin to meropenem Blood cultures: 1/2  growing bacillus species -Repeat blood cultures ordered today 06/10/2021  Status post aggressive IV fluid resuscitation with LR continue maintenance   -CT abdomen pelvis reviewed: Revealing mild right-sided hydronephrosis

## 2021-06-09 NOTE — Progress Notes (Signed)
PROGRESS NOTE    Patient: Megan Mullins                            PCP: Pcp, No                    DOB: [birth year]            DOA: 06/08/2021 [id number]             DOS: 06/09/2021, 12:30 PM   LOS: 1 day   Date of Service: The patient was seen and examined on 06/09/2021  Subjective:   Patient was seen and examined this morning, stable no acute distress, still tachycardic. Was tachypneic, tachycardic overnight, BP was also soft overnight, improved this morning  Brief Narrative:     Megan Mullins is a 51 year old female with a history of hypertension, diabetes mellitus type II, presenting fever, abdominal pain, nausea, vomiting... Worsening symptoms over the past 5 hours.   ED:  Blood pressure (!) 168/111, pulse (!) 145, temperature (!) 103.3 F (39.6 C), resp. rate (!) 23, height 5\' 1"  (1.549 m), weight 68 kg, SpO2 99 %. CBC WBC 13.3, hemoglobin 10.4, 11.5, CMP: Creatinine 1.38, blood glucose level 170, UA moderate hemoglobin, positive nitrites, large leukocyte esterase,> 50 WBCs  EKG: V. tach heart rate of 150, QTc 446,  CT abdomen/pelvis: IMPRESSION: 1. Mild right-sided hydronephrosis with urothelial thickening and perinephric/periureteric stranding. No renal, ureteral or bladder calculi identified. Findings may represent sequela of a recently passed stone versus ascending urinary tract infection. Correlate with  urinalysis. 2. 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 3.  Aortic Atherosclerosis   Patient was given 2 L, of normal saline, Toradol, Zofran, Maalox, Tylenol and IV Rocephin  Requested patient to be admitted for SIRS -ruling out sepsis, pyelonephritis    Assessment & Plan:   Principal Problem:   Sepsis (HCC) Active Problems:   Acute pyelonephritis   Type 2 diabetes mellitus with hyperosmolar nonketotic hyperglycemia (HCC)   Multifocal atrial tachycardia (HCC)   Hypomagnesemia   HTN (hypertension)   Substance abuse (HCC)    AKI (acute kidney injury) (HCC)     Assessment and Plan: * Sepsis (HCC) -Sepsis was ruled in: source of urine tract infection, pyelonephritis Tachycardic, hypotensive overnight, with WBC of 19.8, lactic acid 1.9, 2.2, 1.1 now, creatinine elevated 1.71   - On arrival meeting SIRS criteria, BP(!) 168/111, pulse (!) 145, TMAX  (!) 103.3 F (39.6 C), resp. rate (!) 23, height 5\' 1"  (1.549 m), weight 68 kg, SpO2 99 %.  -Likely source UTI, pyelonephritis -Status post IV fluid resuscitation with LR, continue maintenance fluid with LR, antibiotics of iv Rocephin 2 g daily -Blood and urine cultures obtained --will follow accordingly  -CT abdomen pelvis reviewed: Revealing mild right-sided hydronephrosis    Acute pyelonephritis - CT abdomen pelvis reviewed:  IMPRESSION: 1. Mild right-sided hydronephrosis with urothelial thickening and perinephric/periureteric stranding. No renal, ureteral or bladder calculi identified. Findings may represent sequela of a recently passed stone versus ascending urinary tract infection. Correlate with urinalysis. 2. 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 3.  Aortic Atherosclerosis  -Continue treatment per sepsis protocol IV fluids, IV antibiotics, Nausea note closely,  Hypomagnesemia - Repleting with 2 g of magnesium  Multifocal atrial tachycardia (HCC) - Ventricular tachycardia noted on arrival EKG reading heart rate 150, PR 66, QRS duration 88, QTc 446, Sinus ventricular tachycardia with PVCs -Due to SIRS/sepsis -TSH 0.701  within normal limits -Continue IV fluid resuscitation, trend electrolytes, potassium 3.9,  -Continue repleting electrolytes including magnesium -Closely, patient's home medication metoprolol has been increased from 50 to 100 mg p.o. twice daily  Type 2 diabetes mellitus with hyperosmolar nonketotic hyperglycemia (HCC) - Home medication reviewed, long-acting insulin 10 units nightly, metformin 1 g twice  daily - Holding home medication of metformin, -Resuming long-acting insulin (substituting for formulary) -Checking blood sugar QA CHS, with SSI coverage -A1c:   HTN (hypertension) - Hypertensive blood pressure as high as 198/121, currently 168/111 -Monitoring BP closely -in anticipation of hypotension due to SIRS, sepsis -As needed hydralazine  -Well resuming home medication of metoprolol, and losartan   AKI (acute kidney injury) (HCC) - BUN 12, creatinine 1.38 >>> 1.77 -Mild AKI likely due to hypotension-SIRS/sepsis We will continue IV fluid hydration, avoiding nephrotoxins  Substance abuse (HCC) - Admitted to using cocaine 3 days prior to day of admission -Was advised to abstain from any drug use and abuse, -Monitoring for signs symptoms of withdrawal    --------------------------------------------------------------------------------------------------------- Cultures; Blood Cultures x 2 >> NGT Urine Culture  >>> NGT  Sputum Culture >> NGT   -------------------------------------------------------------------------------------------------------------------------------  DVT prophylaxis:  heparin injection 5,000 Units Start: 06/08/21 1400 TED hose Start: 06/08/21 1354 SCDs Start: 06/08/21 1354   Code Status:   Code Status: Full Code  Family Communication: No family member present at bedside- attempt will be made to update daily The above findings and plan of care has been discussed with patient (and family)  in detail,  they expressed understanding and agreement of above. -Advance care planning has been discussed.   Admission status:   Status is: Inpatient Remains inpatient appropriate because: Meeting sepsis criteria needing IV fluids, IV antibiotics     Procedures:   No admission procedures for hospital encounter.   Antimicrobials:  Anti-infectives (From admission, onward)    Start     Dose/Rate Route Frequency Ordered Stop   06/09/21 1200  cefTRIAXone  (ROCEPHIN) 2 g in sodium chloride 0.9 % 100 mL IVPB        2 g 200 mL/hr over 30 Minutes Intravenous Every 24 hours 06/08/21 1356 06/16/21 1159   06/08/21 1430  cefTRIAXone (ROCEPHIN) 1 g in sodium chloride 0.9 % 100 mL IVPB        1 g 200 mL/hr over 30 Minutes Intravenous  Once 06/08/21 1359 06/08/21 1730   06/08/21 1215  cefTRIAXone (ROCEPHIN) 1 g in sodium chloride 0.9 % 100 mL IVPB        1 g 200 mL/hr over 30 Minutes Intravenous  Once 06/08/21 1203 06/08/21 1253        Medication:   acidophilus  1 capsule Oral TID WC   Chlorhexidine Gluconate Cloth  6 each Topical Daily   heparin  5,000 Units Subcutaneous Q8H   insulin aspart  0-9 Units Subcutaneous TID WC   insulin glargine-yfgn  10 Units Subcutaneous Daily   metoprolol tartrate  100 mg Oral BID   sodium chloride flush  3 mL Intravenous Q12H   sodium chloride flush  3 mL Intravenous Q12H    sodium chloride, acetaminophen **OR** acetaminophen, ALPRAZolam, bisacodyl, hydrALAZINE, HYDROmorphone (DILAUDID) injection, ipratropium, ketorolac, levalbuterol, metoprolol tartrate, ondansetron **OR** ondansetron (ZOFRAN) IV, oxyCODONE, senna-docusate, sodium chloride flush, traZODone   Objective:   Vitals:   06/09/21 0400 06/09/21 0736 06/09/21 1050 06/09/21 1145  BP:   137/88   Pulse:   (!) 111   Resp:      Temp: 99.4 F (  37.4 C) 98.7 F (37.1 C)  99.6 F (37.6 C)  TempSrc: Oral   Oral  SpO2:      Weight:      Height:        Intake/Output Summary (Last 24 hours) at 06/09/2021 1230 Last data filed at 06/09/2021 1055 Gross per 24 hour  Intake 921.49 ml  Output 1200 ml  Net -278.51 ml   Filed Weights   06/08/21 0747  Weight: 68 kg     Examination:   Physical Exam  Constitution:  Alert, cooperative, no distress,  Appears calm and comfortable  Psychiatric:   Normal and stable mood and affect, cognition intact,   HEENT:        Normocephalic, PERRL, otherwise with in Normal limits  Chest:         Chest  symmetric Cardio vascular:  S1/S2, RRR, No murmure, No Rubs or Gallops  pulmonary: Clear to auscultation bilaterally, respirations unlabored, negative wheezes / crackles Abdomen: Soft, non-tender, non-distended, bowel sounds,no masses, no organomegaly Muscular skeletal: Limited exam - in bed, able to move all 4 extremities,   Neuro: CNII-XII intact. , normal motor and sensation, reflexes intact  Extremities: No pitting edema lower extremities, +2 pulses  Skin: Dry, warm to touch, negative for any Rashes, No open wounds Wounds: per nursing documentation   ------------------------------------------------------------------------------------------------------------------------------------------    LABs:     Latest Ref Rng & Units 06/09/2021    3:55 AM 06/08/2021    9:00 AM 02/23/2021    5:25 AM  CBC  WBC 4.0 - 10.5 K/uL 19.8   13.3   4.4    Hemoglobin 12.0 - 15.0 g/dL 9.0   70.3   50.0    Hematocrit 36.0 - 46.0 % 29.2   32.8   34.1    Platelets 150 - 400 K/uL 554   746   296        Latest Ref Rng & Units 06/09/2021    3:55 AM 06/08/2021    9:00 AM 02/23/2021    5:25 AM  CMP  Glucose 70 - 99 mg/dL 938   182   993    BUN 6 - 20 mg/dL 17   12   13     Creatinine 0.44 - 1.00 mg/dL   7.16   9.67    Sodium 135 - 145 mmol/L 136   139   133    Potassium 3.5 - 5.1 mmol/L 4.8   3.9   3.7    Chloride 98 - 111 mmol/L 108   107   104    CO2 22 - 32 mmol/L 22   23   24     Calcium 8.9 - 10.3 mg/dL 8.0   9.3   8.1    Total Protein 6.5 - 8.1 g/dL 6.8   8.9   5.8    Total Bilirubin 0.3 - 1.2 mg/dL 0.3   0.2   0.4    Alkaline Phos 38 - 126 U/L 108   80   67    AST 15 - 41 U/L 18   11   24     ALT 0 - 44 U/L 13   10   19          Micro Results Recent Results (from the past 240 hour(s))  Culture, blood (routine x 2)     Status: None (Preliminary result)   Collection Time: 06/08/21  1:58 PM   Specimen: BLOOD LEFT FOREARM  Result Value Ref Range Status  Specimen Description BLOOD LEFT FOREARM   Final   Special Requests   Final    BOTTLES DRAWN AEROBIC ONLY Blood Culture adequate volume   Culture   Final    NO GROWTH < 24 HOURS Performed at [facility], [address] [place], [place] [address]    Report Status PENDING  Incomplete  Culture, blood (routine x 2)     Status: None (Preliminary result)   Collection Time: 06/08/21  2:05 PM   Specimen: BLOOD RIGHT HAND  Result Value Ref Range Status   Specimen Description   Final    BLOOD RIGHT HAND Performed at [facility], [address] [place], [place] [address]    Special Requests   Final    BOTTLES DRAWN AEROBIC AND ANAEROBIC Blood Culture adequate volume Performed at [facility], [address] [place], [place] [address]    Culture  Setup Time   Final    GRAM POSITIVE RODS ANAEROBIC BOTTLE Gram Stain Report Called to,Read Back By and Verified With: [email or web address],[email or web address]  BY MATTHEWS, B 5.25.2023 Performed at [facility], [address] [place], [place] [address]    Culture GRAM POSITIVE RODS  Final   Report Status PENDING  Incomplete  MRSA Next Gen by PCR, Nasal     Status: None   Collection Time: 06/08/21  3:15 PM   Specimen: Nasal Mucosa; Nasal Swab  Result Value Ref Range Status   MRSA by PCR Next Gen NOT DETECTED NOT DETECTED Final    Comment: (NOTE) The GeneXpert MRSA Assay (FDA approved for NASAL specimens only), is one component of a comprehensive MRSA colonization surveillance program. It is not intended to diagnose MRSA infection nor to guide or monitor treatment for MRSA infections. Test performance is not FDA approved in patients less than 49 years old. Performed at [facility], [address] [place], [place] [address]     Radiology Reports No results found.  SIGNED: Kendell BaneSeyed A Wing Gfeller, MD, FHM. [facility],  Pager (please use [email or web address] to page/text) Please use Epic Secure Chat for non-urgent communication (7AM-7PM)  If 7PM-7AM, please contact night-coverage [email or web address], 06/09/2021, 12:30  PM

## 2021-06-09 NOTE — Assessment & Plan Note (Signed)
-   Admitted to using cocaine 3 days prior to day of admission -Was advised to abstain from any drug use and abuse, -Monitoring for signs symptoms of withdrawal

## 2021-06-09 NOTE — Assessment & Plan Note (Addendum)
-   Home medication reviewed, long-acting insulin 10 units nightly, metformin 1 g twice daily -Resuming home regimen

## 2021-06-09 NOTE — Progress Notes (Signed)
   06/09/21 1600  Assess: MEWS Score  Temp (!) 102.9 F (39.4 C)  BP 134/88  Pulse Rate (!) 114  Resp 14  Level of Consciousness Alert  SpO2 99 %  O2 Device Room Air  Assess: MEWS Score  MEWS Temp 2  MEWS Systolic 0  MEWS Pulse 2  MEWS RR 0  MEWS LOC 0  MEWS Score 4  MEWS Score Color Red  Assess: if the MEWS score is Yellow or Red  Were vital signs taken at a resting state? Yes  Focused Assessment No change from prior assessment  Early Detection of Sepsis Score *See Row Information* High  MEWS guidelines implemented *See Row Information* Yes  Treat  MEWS Interventions Administered prn meds/treatments  Pain Scale 0-10  Pain Score 0  Complains of Fever  Interventions Medication (see MAR)  Take Vital Signs  Increase Vital Sign Frequency  Red: Q 1hr X 4 then Q 4hr X 4, if remains red, continue Q 4hrs  Escalate  MEWS: Escalate Red: discuss with charge nurse/RN and provider, consider discussing with RRT  Notify: Charge Nurse/RN  Name of Charge Nurse/RN Notified Deirdre Pippins, RN  Date Charge Nurse/RN Notified 06/09/21  Time Charge Nurse/RN Notified 32  Notify: Provider  Provider Name/Title Skipper Cliche, MD  Date Provider Notified 06/09/21  Time Provider Notified 1638  Method of Notification  (secured chat)  Notification Reason Other (Comment) (Red Mews)  Provider response No new orders  Date of Provider Response 06/09/21  Time of Provider Response [phone]

## 2021-06-09 NOTE — Assessment & Plan Note (Addendum)
-   BUN 12, creatinine 1.38 >>> 1.77 >> 1.65 -Mild AKI likely due to hypotension-SIRS/sepsis We will continue IV fluid hydration, avoiding nephrotoxins

## 2021-06-09 NOTE — TOC Initial Note (Signed)
Transition of Care [facility]) - Initial/Assessment Note    Patient Details  Name: Megan Mullins MRN: [id number] Date of Birth: [birth year]  Transition [facility] CM/SW Contact:    Annice Needy, LCSW Phone Number: 06/09/2021, 2:40 PM  Clinical Narrative:                 Patient admitted for Sepsis. TOC consulted for PCP needs, HH/DME needs/SA resources. Patient has PCP and is seen at the [facility]. She was found to have no PT needs/recommendations.  She is agreeable to SA resources and resources were provided to her.   Expected Discharge Plan: Home/Self Care Barriers to Discharge: Continued Medical Work up   Patient Goals and CMS Choice        Expected Discharge Plan and Services Expected Discharge Plan: Home/Self Care                                              Prior Living Arrangements/Services                       Activities of Daily Living Home Assistive Devices/Equipment: CBG Meter ADL Screening (condition at time of admission) Patient's cognitive ability adequate to safely complete daily activities?: Yes Is the patient deaf or have difficulty hearing?: No Does the patient have difficulty seeing, even when wearing glasses/contacts?: No Does the patient have difficulty concentrating, remembering, or making decisions?: No Patient able to express need for assistance with ADLs?: Yes Does the patient have difficulty dressing or bathing?: No Independently performs ADLs?: Yes (appropriate for developmental age) Does the patient have difficulty walking or climbing stairs?: No Weakness of Legs: None Weakness of Arms/Hands: None  Permission Sought/Granted                  Emotional Assessment              Admission diagnosis:  Pyelonephritis [N12] SIRS (systemic inflammatory response syndrome) (HCC) [R65.10] AKI (acute kidney injury) (HCC) [N17.9] Sepsis (HCC) [A41.9] Patient Active Problem List   Diagnosis Date Noted    Hypomagnesemia 06/09/2021    Class: Acute   Sepsis (HCC) 06/08/2021   HTN (hypertension) 06/08/2021    Class: Chronic   Acute pyelonephritis 06/08/2021    Class: Acute   Multifocal atrial tachycardia (HCC) 06/08/2021    Class: Acute   Substance abuse (HCC) 02/20/2021   Alcohol abuse 02/20/2021   AKI (acute kidney injury) (HCC) 02/20/2021   Trichomonas infection 02/20/2021   Hyponatremia 07/10/2015   Type 2 diabetes mellitus with hyperosmolar nonketotic hyperglycemia (HCC) 03/13/2014   Obesity 03/13/2014   Tobacco abuse 03/13/2014   PCP:  Oneita Hurt, No Pharmacy:   [id number] - [place], [place] - [address] [place] [address] Phone: [phone] Fax: [phone]     Social Determinants of Health (SDOH) Interventions    Readmission Risk Interventions     View : No data to display.

## 2021-06-09 NOTE — Evaluation (Signed)
Physical Therapy Evaluation Patient Details Name: Megan Mullins MRN: [id number] DOB: [birth year] Today's Date: 06/09/2021  History of Present Illness  Megan Mullins is a 51 year old female with a history of hypertension, diabetes mellitus presenting fever, abdominal pain, nausea, vomiting... Worsening symptoms over the past 5 hours.   Clinical Impression  Patient demonstrates ability to perform bed mobility and transfers independently without use of assistive device. Patient was able to ambulate for 80 feet with modified independence and mild gait deviations observed. Patient demonstrates decreased R LE stance time, lack of arm swing, and difficulty weight shifting bilaterally in which is slightly impacting patient's overall balancing ability. Patient also demonstrated a decreased stride/step length along with decreased cadence. Patient was primarily limited by fatigue during assessment.  Patient left in chair with nursing notified of mobility status. Patient discharged to care of nursing for ambulation daily as tolerated for length of stay.      Recommendations for follow up therapy are one component of a multi-disciplinary discharge planning process, led by the attending physician.  Recommendations may be updated based on patient status, additional functional criteria and insurance authorization.  Follow Up Recommendations No PT follow up    Assistance Recommended at Discharge PRN  Patient can return home with the following  Help with stairs or ramp for entrance;Assist for transportation;A little help with walking and/or transfers    Equipment Recommendations None recommended by PT  Recommendations for Other Services       Functional Status Assessment       Precautions / Restrictions Precautions Precautions: None Restrictions Weight Bearing Restrictions: No      Mobility  Bed Mobility Overal bed mobility: Independent             General bed mobility comments: Patient able  to perform supine to sit with independence.    Transfers Overall transfer level: Independent                 General transfer comment: Patient able to perform sit to stand transfer independently with no equipment needed.    Ambulation/Gait Ambulation/Gait assistance: Modified independent (Device/Increase time) Gait Distance (Feet): 80 Feet Assistive device: None Gait Pattern/deviations: Step-to pattern, Decreased step length - right, Decreased step length - left, Decreased stance time - right, Decreased stride length, Decreased weight shift to right, Decreased weight shift to left Gait velocity: decreased     General Gait Details: Patient was able to ambulate for 80 feet with modified independence with decreased gait cadence along with minor gait deficits. Patient demonstrated lack of arm swing along with difficulty with weight shifting bilaterally slightly impacting overall balance.  Stairs            Wheelchair Mobility    Modified Rankin (Stroke Patients Only)       Balance Overall balance assessment: Mild deficits observed, not formally tested                                           Pertinent Vitals/Pain Pain Assessment Pain Assessment: 0-10 Pain Score: 4  Pain Location: L torso area Pain Descriptors / Indicators: Aching Pain Intervention(s): Limited activity within patient's tolerance, Monitored during session, Repositioned    Home Living Family/patient expects to be discharged to:: Private residence Living Arrangements: Parent Available Help at Discharge: Family;Available 24 hours/day Type of Home: House Home Access: Ramped entrance  Home Layout: One level Home Equipment: None Additional Comments: Pt reported no change in history from previous admission.    Prior Function Prior Level of Function : Independent/Modified Independent             Mobility Comments: Community ambulator without AD, does not drive ADLs  Comments: Independent     Hand Dominance   Dominant Hand: Right    Extremity/Trunk Assessment   Upper Extremity Assessment Upper Extremity Assessment: Defer to OT evaluation    Lower Extremity Assessment Lower Extremity Assessment: Overall WFL for tasks assessed    Cervical / Trunk Assessment Cervical / Trunk Assessment: Normal  Communication   Communication: No difficulties  Cognition Arousal/Alertness: Awake/alert Behavior During Therapy: WFL for tasks assessed/performed Overall Cognitive Status: Within Functional Limits for tasks assessed                                          General Comments      Exercises     Assessment/Plan    PT Assessment Patient does not need any further PT services  PT Problem List         PT Treatment Interventions      PT Goals (Current goals can be found in the Care Plan section)  Acute Rehab PT Goals Patient Stated Goal: return home PT Goal Formulation: With patient Time For Goal Achievement: 06/09/21 Potential to Achieve Goals: Good    Frequency       Co-evaluation PT/OT/SLP Co-Evaluation/Treatment: Yes Reason for Co-Treatment: To address functional/ADL transfers PT goals addressed during session: Mobility/safety with mobility;Balance;Strengthening/ROM OT goals addressed during session: ADL's and self-care       AM-PAC PT "6 Clicks" Mobility  Outcome Measure Help needed turning from your back to your side while in a flat bed without using bedrails?: None Help needed moving from lying on your back to sitting on the side of a flat bed without using bedrails?: None Help needed moving to and from a bed to a chair (including a wheelchair)?: None Help needed standing up from a chair using your arms (e.g., wheelchair or bedside chair)?: None Help needed to walk in hospital room?: None Help needed climbing 3-5 steps with a railing? : A Little 6 Click Score: 23    End of Session   Activity Tolerance:  Patient tolerated treatment well;Patient limited by fatigue Patient left: in chair;with call bell/phone within reach Nurse Communication: Mobility status PT Visit Diagnosis: Unsteadiness on feet (R26.81);Other abnormalities of gait and mobility (R26.89);Muscle weakness (generalized) (M62.81)    Time: [id number] PT Time Calculation (min) (ACUTE ONLY): 20 min   Charges:   PT Evaluation $PT Eval Moderate Complexity: 1 Mod PT Treatments $Therapeutic Activity: 8-22 mins        11:59 AM, 06/09/21 Lestine Box, S/PT

## 2021-06-09 NOTE — Assessment & Plan Note (Addendum)
-  Improved -On arrival EKG reading heart rate 150, PR 66, QRS duration 88, QTc 446, Sinus ventricular tachycardia with PVCs -Due to SIRS/sepsis -TSH 0.701 within normal limits -Status post IV fluid resuscitation, electrolyte repletion - increased from 50 to 100 mg p.o. twice daily

## 2021-06-09 NOTE — Assessment & Plan Note (Signed)
-   CT abdomen pelvis reviewed:  IMPRESSION: 1. Mild right-sided hydronephrosis with urothelial thickening and perinephric/periureteric stranding. No renal, ureteral or bladder calculi identified. Findings may represent sequela of a recently passed stone versus ascending urinary tract infection. Correlate with urinalysis. 2. 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 3.  Aortic Atherosclerosis  -Continue treatment per sepsis protocol IV fluids, IV antibiotics, Nausea note closely, 

## 2021-06-10 DIAGNOSIS — D500 Iron deficiency anemia secondary to blood loss (chronic): Secondary | ICD-10-CM

## 2021-06-10 DIAGNOSIS — D509 Iron deficiency anemia, unspecified: Secondary | ICD-10-CM | POA: Diagnosis present

## 2021-06-10 DIAGNOSIS — R7401 Elevation of levels of liver transaminase levels: Secondary | ICD-10-CM | POA: Insufficient documentation

## 2021-06-10 LAB — URINE CULTURE: Culture: >=100000 — AB

## 2021-06-10 LAB — IRON AND TIBC
Iron: 105 ug/dL (ref 28–170)
Saturation Ratios: 44 % — ABNORMAL HIGH (ref 10.4–31.8)
TIBC: 238 ug/dL — ABNORMAL LOW (ref 250–450)
UIBC: 133 ug/dL

## 2021-06-10 LAB — VITAMIN B12: Vitamin B-12: 841 pg/mL (ref 180–914)

## 2021-06-10 LAB — COMPREHENSIVE METABOLIC PANEL
ALT: 102 U/L — ABNORMAL HIGH (ref 0–44)
AST: 178 U/L — ABNORMAL HIGH (ref 15–41)
Albumin: 2.2 g/dL — ABNORMAL LOW (ref 3.5–5.0)
Alkaline Phosphatase: 140 U/L — ABNORMAL HIGH (ref 38–126)
Anion gap: 8 (ref 5–15)
BUN: 21 mg/dL — ABNORMAL HIGH (ref 6–20)
CO2: 21 mmol/L — ABNORMAL LOW (ref 22–32)
Calcium: 8.1 mg/dL — ABNORMAL LOW (ref 8.9–10.3)
Chloride: 108 mmol/L (ref 98–111)
Creatinine, Ser: 1.65 mg/dL — ABNORMAL HIGH (ref 0.44–1.00)
GFR, Estimated: 38 mL/min — ABNORMAL LOW (ref >60)
Glucose, Bld: 124 mg/dL — ABNORMAL HIGH (ref 70–99)
Potassium: 4.4 mmol/L (ref 3.5–5.1)
Sodium: 137 mmol/L (ref 135–145)
Total Bilirubin: 0.2 mg/dL — ABNORMAL LOW (ref 0.3–1.2)
Total Protein: 6.4 g/dL — ABNORMAL LOW (ref 6.5–8.1)

## 2021-06-10 LAB — CBC
HCT: 24.2 % — ABNORMAL LOW (ref 36.0–46.0)
Hemoglobin: 7.4 g/dL — ABNORMAL LOW (ref 12.0–15.0)
MCH: 29.5 pg (ref 26.0–34.0)
MCHC: 30.6 g/dL (ref 30.0–36.0)
MCV: 96.4 fL (ref 80.0–100.0)
Platelets: 462 10*3/uL — ABNORMAL HIGH (ref 150–400)
RBC: 2.51 MIL/uL — ABNORMAL LOW (ref 3.87–5.11)
RDW: 15.5 % (ref 11.5–15.5)
WBC: 14.8 10*3/uL — ABNORMAL HIGH (ref 4.0–10.5)
nRBC: 0 % (ref 0.0–0.2)

## 2021-06-10 LAB — HEPATITIS PANEL, ACUTE
HCV Ab: NONREACTIVE
Hep A IgM: NONREACTIVE
Hep B C IgM: NONREACTIVE
Hepatitis B Surface Ag: NONREACTIVE

## 2021-06-10 LAB — GLUCOSE, CAPILLARY
Glucose-Capillary: 122 mg/dL — ABNORMAL HIGH (ref 70–99)
Glucose-Capillary: 119 mg/dL — ABNORMAL HIGH (ref 70–99)
Glucose-Capillary: 95 mg/dL (ref 70–99)
Glucose-Capillary: 115 mg/dL — ABNORMAL HIGH (ref 70–99)

## 2021-06-10 LAB — FOLATE: Folate: 18 ng/mL (ref >5.9)

## 2021-06-10 MED ORDER — SODIUM CHLORIDE 0.9 % IV SOLN
1.0000 g | Freq: Two times a day (BID) | INTRAVENOUS | Status: DC
  Administered 2021-06-10 – 2021-06-12 (×6): 1 g via INTRAVENOUS
  Filled 2021-06-10 (×7): qty 20

## 2021-06-10 NOTE — Assessment & Plan Note (Signed)
-  Acute elevation of alk phos 140 LFTs, AST 178, ALT 102,  -Likely due to sepsis, -Continue IV fluid rehydration -Hepatitis panel

## 2021-06-10 NOTE — Progress Notes (Signed)
PROGRESS NOTE    Patient: Megan Mullins                            PCP: Pcp, No                    DOB: 11/07/1970            DOA: 06/08/2021 [id number]             DOS: 06/10/2021, 11:54 AM   LOS: 2 days   Date of Service: The patient was seen and examined on 06/10/2021  Subjective:   The patient was seen and examined this morning, stable no acute distress, 70 having any chest pain or shortness of breath, improved right CVA tenderness Satting 100% on room air Blood pressure has much improved  Noted for urine culture growing E. coli, ESBL, 1 out of 2 blood culture growing bacillus species  Brief Narrative:     Megan Mullins is a 51 year old female with a history of hypertension, diabetes mellitus type II, presenting fever, abdominal pain, nausea, vomiting... Worsening symptoms over the past 5 hours.   ED:  Blood pressure (!) 168/111, pulse (!) 145, temperature (!) 103.3 F (39.6 C), resp. rate (!) 23, height _0  (1.549 m), weight 68 kg, SpO2 99 %. CBC WBC 13.3, hemoglobin 10.4, 11.5, CMP: Creatinine 1.38, blood glucose level 170, UA moderate hemoglobin, positive nitrites, large leukocyte esterase,> 50 WBCs  EKG: V. tach heart rate of 150, QTc 446,  CT abdomen/pelvis: IMPRESSION: 1. Mild right-sided hydronephrosis with urothelial thickening and perinephric/periureteric stranding. No renal, ureteral or bladder calculi identified. Findings may represent sequela of a recently passed stone versus ascending urinary tract infection. Correlate with  urinalysis. 2. 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 3.  Aortic Atherosclerosis   Patient was given 2 L, of normal saline, Toradol, Zofran, Maalox, Tylenol and IV Rocephin  Requested patient to be admitted for SIRS -ruling out sepsis, pyelonephritis    Assessment & Plan:   Principal Problem:   Sepsis ([place]) Active Problems:   Acute pyelonephritis   Type 2 diabetes mellitus with hyperosmolar  nonketotic hyperglycemia (HCC)   Multifocal atrial tachycardia (HCC)   Hypomagnesemia   Transaminitis   Iron deficiency anemia   HTN (hypertension)   Substance abuse (HCC)   AKI (acute kidney injury) ([place])     Assessment and Plan: * Sepsis ([place]) -Sepsis physiology resolved Blood pressure (!) 134/94, pulse 95, temperature 98.3 F (36.8 C), resp. rate 18, height _1  (1.549 m), weight 79.2 kg, SpO2 100 %.  WBC: 13.3, 19.8, 14.8 today -Lactic acid 1.1, 0.2, 1.9   - On arrival meeting SIRS criteria, BP(!) 168/111, pulse (!) 145, TMAX  (!) 103.3 F (39.6 C), resp. rate (!) 23, height _2  (1.549 m), weight 68 kg, SpO2 99 %.  -Likely source UTI, pyelonephritis -Urine culture growing E. coli, ESBL,  Biotics switched from Rocephin to meropenem Blood cultures: 1/2  growing bacillus species -Repeat blood cultures ordered today 06/10/2021  Status post aggressive IV fluid resuscitation with LR continue maintenance   -CT abdomen pelvis reviewed: Revealing mild right-sided hydronephrosis    Acute pyelonephritis - CT abdomen pelvis reviewed:  IMPRESSION: 1. Mild right-sided hydronephrosis with urothelial thickening and perinephric/periureteric stranding. No renal, ureteral or bladder calculi identified. Findings may represent sequela of a recently passed stone versus ascending urinary tract infection. Correlate with urinalysis. 2. Marked degenerative change of the left-greater-than-right  hips. 3.  Aortic Atherosclerosis  -Continue treatment per sepsis protocol IV fluids, IV antibiotics, Nausea note closely,  Iron deficiency anemia - Monitoring hemoglobin  -Hemoglobin 9.0 >>> 7.4 -As of bleeding status post aggressive IV fluid resuscitation, likely delusional -Pending iron studies, Hemoccult  Transaminitis - Acute elevation of alk phos 140 LFTs, AST 178, ALT 102,  -Likely due to sepsis, -Continue IV fluid rehydration -Hepatitis panel  Hypomagnesemia - Status post IV   2 g of magnesium  Multifocal atrial tachycardia (HCC) -Improved - Ventricular tachycardia noted on arrival EKG reading heart rate 150, PR 66, QRS duration 88, QTc 446, Sinus ventricular tachycardia with PVCs -Due to SIRS/sepsis -TSH 0.701 within normal limits -Continue IV fluid resuscitation, trend electrolytes, potassium 3.9,  -Continue repleting electrolytes including magnesium -Closely, patient's home medication metoprolol has been increased from 50 to 100 mg p.o. twice daily  Type 2 diabetes mellitus with hyperosmolar nonketotic hyperglycemia (HCC) - Home medication reviewed, long-acting insulin 10 units nightly, metformin 1 g twice daily - Holding home medication of metformin, -Resuming long-acting insulin (substituting for formulary) -Checking blood sugar QA CHS, with SSI coverage -A1c:   HTN (hypertension) - Hypertensive..  On admission blood pressure as high as 198/121, currently 168/111 -Holding home medication losartan -AKI, sepsis -Increased her metoprolol from 50 to 100 mg p.o. twice daily -Continue as needed hydralazine   AKI (acute kidney injury) ([place]) - BUN 12, creatinine 1.38 >>> 1.77 >> 1.65 -Mild AKI likely due to hypotension-SIRS/sepsis We will continue IV fluid hydration, avoiding nephrotoxins  Substance abuse ([place]) - Admitted to using cocaine 3 days prior to day of admission -Was advised to abstain from any drug use and abuse, -Monitoring for signs symptoms of withdrawal    --------------------------------------------------------------------------------------------------------- Cultures; Blood Cultures x 1/2 >> Bacillus species -likely contaminant Repeating blood cultures 06/10/2021>>>  Urine Culture  >>> E. coli, ESBL, multi drug-resistant   -------------------------------------------------------------------------------------------------------------------------------  DVT prophylaxis:  heparin injection 5,000 Units Start: 06/08/21 1400 TED hose  Start: 06/08/21 1354 SCDs Start: 06/08/21 1354   Code Status:   Code Status: Full Code  Family Communication: No family present, discussed with patient  the above findings and plan of care has been discussed with patient  in detail,  they expressed understanding and agreement of above. -Advance care planning has been discussed.   Admission status:   Status is: Inpatient Remains inpatient appropriate because: Meeting sepsis criteria needing IV fluids, IV antibiotics     Procedures:   No admission procedures for hospital encounter.   Antimicrobials:  Anti-infectives (From admission, onward)    Start     Dose/Rate Route Frequency Ordered Stop   06/10/21 1000  meropenem (MERREM) 1 g in sodium chloride 0.9 % 100 mL IVPB        1 g 200 mL/hr over 30 Minutes Intravenous Every 12 hours 06/10/21 0844     06/09/21 1200  cefTRIAXone (ROCEPHIN) 2 g in sodium chloride 0.9 % 100 mL IVPB  Status:  Discontinued        2 g 200 mL/hr over 30 Minutes Intravenous Every 24 hours 06/08/21 1356 06/10/21 0844   06/08/21 1430  cefTRIAXone (ROCEPHIN) 1 g in sodium chloride 0.9 % 100 mL IVPB        1 g 200 mL/hr over 30 Minutes Intravenous  Once 06/08/21 1359 06/08/21 1730   06/08/21 1215  cefTRIAXone (ROCEPHIN) 1 g in sodium chloride 0.9 % 100 mL IVPB        1 g 200 mL/hr over 30  Minutes Intravenous  Once 06/08/21 1203 06/08/21 1253        Medication:   acidophilus  1 capsule Oral TID WC   heparin  5,000 Units Subcutaneous Q8H   insulin aspart  0-9 Units Subcutaneous TID WC   insulin glargine-yfgn  10 Units Subcutaneous Daily   metoprolol tartrate  100 mg Oral BID   sodium chloride flush  3 mL Intravenous Q12H   sodium chloride flush  3 mL Intravenous Q12H    sodium chloride, acetaminophen **OR** acetaminophen, ALPRAZolam, bisacodyl, hydrALAZINE, HYDROmorphone (DILAUDID) injection, ipratropium, ketorolac, levalbuterol, metoprolol tartrate, ondansetron **OR** ondansetron (ZOFRAN) IV,  oxyCODONE, senna-docusate, sodium chloride flush, traZODone   Objective:   Vitals:   06/10/21 0212 06/10/21 0434 06/10/21 0436 06/10/21 0835  BP: (!) 118/94  115/86 (!) 134/94  Pulse: 87  92 95  Resp: 18  18   Temp: 98.4 F (36.9 C)  98.3 F (36.8 C)   TempSrc:      SpO2: 100%  100%   Weight:  79.2 kg    Height:  _0  (1.549 m)      Intake/Output Summary (Last 24 hours) at 06/10/2021 1154 Last data filed at 06/10/2021 0700 Gross per 24 hour  Intake 2701.2 ml  Output --  Net 2701.2 ml   Filed Weights   06/08/21 0747 06/10/21 0434  Weight: 68 kg 79.2 kg     Examination:      Physical Exam:   General:  AAO x 3,  cooperative, no distress;   HEENT:  Normocephalic, PERRL, otherwise with in Normal limits   Neuro:  CNII-XII intact. , normal motor and sensation, reflexes intact   Lungs:   Clear to auscultation BL, Respirations unlabored,  No wheezes / crackles  Cardio:    S1/S2, RRR, No murmure, No Rubs or Gallops   Abdomen:  Soft, non-tender, bowel sounds active all four quadrants, no guarding or peritoneal signs.  Muscular  skeletal:  Limited exam -global generalized weaknesses - in bed, able to move all 4 extremities,   2+ pulses,  symmetric, No pitting edema  Skin:  Dry, warm to touch, negative for any Rashes,  Wounds: Please see nursing documentation            LABs:     Latest Ref Rng & Units 06/10/2021    4:32 AM 06/09/2021    3:55 AM 06/08/2021    9:00 AM  CBC  WBC 4.0 - 10.5 K/uL 14.8   19.8   13.3    Hemoglobin 12.0 - 15.0 g/dL 7.4   9.0   10.4    Hematocrit 36.0 - 46.0 % 24.2   29.2   32.8    Platelets 150 - 400 K/uL 462   554   746        Latest Ref Rng & Units 06/10/2021    4:32 AM 06/09/2021    3:55 AM 06/08/2021    9:00 AM  CMP  Glucose 70 - 99 mg/dL 124   187   170    BUN 6 - 20 mg/dL _1 Creatinine 0.44 - 1.00 mg/dL 1.65   1.71   1.38    Sodium 135 - 145 mmol/L 137   136   139    Potassium 3.5 - 5.1 mmol/L 4.4   4.8   3.9     Chloride 98 - 111 mmol/L 108   108   107    CO2 22 -  32 mmol/L _0 Calcium 8.9 - 10.3 mg/dL 8.1   8.0   9.3    Total Protein 6.5 - 8.1 g/dL 6.4   6.8   8.9    Total Bilirubin 0.3 - 1.2 mg/dL 0.2   0.3   0.2    Alkaline Phos 38 - 126 U/L 140   108   80    AST 15 - 41 U/L 178   18   11    ALT 0 - 44 U/L 102   13   10         Micro Results Recent Results (from the past 240 hour(s))  Urine Culture     Status: Abnormal   Collection Time: 06/08/21  9:03 AM   Specimen: Urine, Clean Catch  Result Value Ref Range Status   Specimen Description   Final    URINE, CLEAN CATCH Performed at [facility], [address], [place], [place] [address]    Special Requests   Final    NONE Performed at [facility], [address] [place], [place] [address]    Culture (A)  Final    >=100,000 COLONIES/mL ESCHERICHIA COLI Confirmed Extended Spectrum Beta-Lactamase Producer (ESBL).  In bloodstream infections from ESBL organisms, carbapenems are preferred over piperacillin/tazobactam. They are shown to have a lower risk of mortality.    Report Status 06/10/2021 FINAL  Final   Organism ID, Bacteria ESCHERICHIA COLI (A)  Final      Susceptibility   Escherichia coli - MIC*    AMPICILLIN >=32 RESISTANT Resistant     CEFAZOLIN >=64 RESISTANT Resistant     CEFEPIME 0.5 SENSITIVE Sensitive     CEFTRIAXONE 32 RESISTANT Resistant     CIPROFLOXACIN >=4 RESISTANT Resistant     GENTAMICIN <=1 SENSITIVE Sensitive     IMIPENEM <=0.25 SENSITIVE Sensitive     NITROFURANTOIN <=16 SENSITIVE Sensitive     TRIMETH/SULFA >=320 RESISTANT Resistant     AMPICILLIN/SULBACTAM 4 SENSITIVE Sensitive     PIP/TAZO <=4 SENSITIVE Sensitive     * >=100,000 COLONIES/mL ESCHERICHIA COLI  Culture, blood (routine x 2)     Status: None (Preliminary result)   Collection Time: 06/08/21  1:58 PM   Specimen: BLOOD LEFT FOREARM  Result Value Ref Range Status   Specimen Description BLOOD LEFT FOREARM  Final   Special  Requests   Final    BOTTLES DRAWN AEROBIC ONLY Blood Culture adequate volume   Culture   Final    NO GROWTH 1 DAY Performed at [facility], [address], [place], [place] [address]    Report Status PENDING  Incomplete  Culture, blood (routine x 2)     Status: Abnormal (Preliminary result)   Collection Time: 06/08/21  2:05 PM   Specimen: BLOOD RIGHT HAND  Result Value Ref Range Status   Specimen Description   Final    BLOOD RIGHT HAND Performed at [facility], [address], [place], [place] [address]    Special Requests   Final    BOTTLES DRAWN AEROBIC AND ANAEROBIC Blood Culture adequate volume Performed at [facility], [address], [place], [place] [address]    Culture  Setup Time   Final    GRAM POSITIVE RODS ANAEROBIC BOTTLE Gram Stain Report Called to,Read Back By and Verified With: KINDLEY,C_1  BY MATTHEWS, B 5.25.2023 Performed at [facility], [address], [place], [place] [address]    Culture (A)  Final  BACILLUS SPECIES THE SIGNIFICANCE OF ISOLATING THIS ORGANISM FROM A SINGLE VENIPUNCTURE CANNOT BE PREDICTED WITHOUT FURTHER CLINICAL AND CULTURE CORRELATION. SUSCEPTIBILITIES AVAILABLE ONLY ON REQUEST. Performed at [facility], [place] [address] [place], [place] [address]    Report Status PENDING  Incomplete  MRSA Next Gen by PCR, Nasal     Status: None   Collection Time: 06/08/21  3:15 PM   Specimen: Nasal Mucosa; Nasal Swab  Result Value Ref Range Status   MRSA by PCR Next Gen NOT DETECTED NOT DETECTED Final    Comment: (NOTE) The GeneXpert MRSA Assay (FDA approved for NASAL specimens only), is one component of a comprehensive MRSA colonization surveillance program. It is not intended to diagnose MRSA infection nor to guide or monitor treatment for MRSA infections. Test performance is not FDA approved in patients less than 12 years old. Performed at [facility], [address] [place], [place] [address]     Radiology Reports No results  found.  SIGNED: Deatra James, MD, FHM. [facility],  Pager (please use [email or web address] to page/text) Please use Epic Secure Chat for non-urgent communication (7AM-7PM)  If 7PM-7AM, please contact night-coverage [email or web address], 06/10/2021, 11:54 AM

## 2021-06-10 NOTE — Assessment & Plan Note (Signed)
-  Improved - Hypertensive..  On admission blood pressure as high as 198/121, currently 168/111 -Holding home medication losartan -AKI, sepsis -Increased her metoprolol from 50 to 100 mg p.o. twice daily -Continue as needed hydralazine

## 2021-06-10 NOTE — Assessment & Plan Note (Signed)
-   Monitoring hemoglobin  -Hemoglobin 9.0 >>> 7.4 -As of bleeding status post aggressive IV fluid resuscitation, likely delusional -Pending iron studies, Hemoccult

## 2021-06-11 ENCOUNTER — Inpatient Hospital Stay (HOSPITAL_COMMUNITY): Payer: Self-pay

## 2021-06-11 DIAGNOSIS — K7200 Acute and subacute hepatic failure without coma: Secondary | ICD-10-CM

## 2021-06-11 LAB — COMPREHENSIVE METABOLIC PANEL
ALT: 1527 U/L — ABNORMAL HIGH (ref 0–44)
AST: 3834 U/L — ABNORMAL HIGH (ref 15–41)
Albumin: 2.5 g/dL — ABNORMAL LOW (ref 3.5–5.0)
Alkaline Phosphatase: 219 U/L — ABNORMAL HIGH (ref 38–126)
Anion gap: 7 (ref 5–15)
BUN: 30 mg/dL — ABNORMAL HIGH (ref 6–20)
CO2: 21 mmol/L — ABNORMAL LOW (ref 22–32)
Calcium: 8 mg/dL — ABNORMAL LOW (ref 8.9–10.3)
Chloride: 105 mmol/L (ref 98–111)
Creatinine, Ser: 1.8 mg/dL — ABNORMAL HIGH (ref 0.44–1.00)
GFR, Estimated: 34 mL/min — ABNORMAL LOW (ref >60)
Glucose, Bld: 102 mg/dL — ABNORMAL HIGH (ref 70–99)
Potassium: 4.6 mmol/L (ref 3.5–5.1)
Sodium: 133 mmol/L — ABNORMAL LOW (ref 135–145)
Total Bilirubin: 0.5 mg/dL (ref 0.3–1.2)
Total Protein: 7 g/dL (ref 6.5–8.1)

## 2021-06-11 LAB — CBC
HCT: 24.8 % — ABNORMAL LOW (ref 36.0–46.0)
Hemoglobin: 8.1 g/dL — ABNORMAL LOW (ref 12.0–15.0)
MCH: 30.5 pg (ref 26.0–34.0)
MCHC: 32.7 g/dL (ref 30.0–36.0)
MCV: 93.2 fL (ref 80.0–100.0)
Platelets: 501 10*3/uL — ABNORMAL HIGH (ref 150–400)
RBC: 2.66 MIL/uL — ABNORMAL LOW (ref 3.87–5.11)
RDW: 15.5 % (ref 11.5–15.5)
WBC: 10.8 10*3/uL — ABNORMAL HIGH (ref 4.0–10.5)
nRBC: 0.5 % — ABNORMAL HIGH (ref 0.0–0.2)

## 2021-06-11 LAB — GLUCOSE, CAPILLARY
Glucose-Capillary: 105 mg/dL — ABNORMAL HIGH (ref 70–99)
Glucose-Capillary: 106 mg/dL — ABNORMAL HIGH (ref 70–99)
Glucose-Capillary: 94 mg/dL (ref 70–99)
Glucose-Capillary: 136 mg/dL — ABNORMAL HIGH (ref 70–99)

## 2021-06-11 LAB — CULTURE, BLOOD (ROUTINE X 2): Special Requests: ADEQUATE

## 2021-06-11 LAB — PROTIME-INR
INR: 1.4 — ABNORMAL HIGH (ref 0.8–1.2)
Prothrombin Time: 17 seconds — ABNORMAL HIGH (ref 11.4–15.2)

## 2021-06-11 MED ORDER — LACTATED RINGERS IV SOLN: INTRAVENOUS | Status: AC

## 2021-06-11 MED ORDER — VITAMIN K1 10 MG/ML IJ SOLN
10.0000 mg | Freq: Once | INTRAVENOUS | Status: AC
  Administered 2021-06-11: 10 mg via INTRAVENOUS
  Filled 2021-06-11: qty 1

## 2021-06-11 MED ORDER — IOHEXOL 9 MG/ML PO SOLN
ORAL | Status: AC
Start: 2021-06-11 — End: 2021-06-11
  Filled 2021-06-11: qty 1000

## 2021-06-11 NOTE — Assessment & Plan Note (Addendum)
-   Patient has developed acute liver failure Unknown etiology possible sepsis   -Acute transaminitis: Elevated LFTs,    Latest Ref Rng & Units 06/13/2021    4:19 AM 06/12/2021    3:58 AM 06/11/2021    4:37 AM  Hepatic Function  Total Protein 6.5 - 8.1 g/dL 6.1   6.1   7.0    Albumin 3.5 - 5.0 g/dL 2.1   2.1   2.5    AST 15 - 41 U/L 279   1,007   3,834    ALT 0 - 44 U/L 640   999   1,527    Alk Phosphatase 38 - 126 U/L 163   195   219    Total Bilirubin 0.3 - 1.2 mg/dL 0.5   0.5   0.5      -Status post IV fluid resuscitation -Hepatitis panel all negative -Avoiding all hepatotoxins including Tylenol -Much improved LFTs -Appreciate GI input

## 2021-06-11 NOTE — Progress Notes (Signed)
PROGRESS NOTE    Patient: Megan Mullins                            PCP: Pcp, No                    DOB: 04/04/1970            DOA: 06/08/2021 DJT:[id number]             DOS: 06/11/2021, 10:39 AM   LOS: 3 days   Date of Service: The patient was seen and examined on 06/11/2021  Subjective:   The patient was seen and examined this morning, stable no acute distress, 72 having any chest pain or shortness of breath, improved right CVA tenderness Satting 100% on room air Blood pressure has much improved  Noted for urine culture growing E. coli, ESBL, 1 out of 2 blood culture growing bacillus species  Brief Narrative:     Megan Mullins is a 51 year old female with a history of hypertension, diabetes mellitus type II, presenting fever, abdominal pain, nausea, vomiting... Worsening symptoms over the past 5 hours.   ED:  Blood pressure (!) 168/111, pulse (!) 145, temperature (!) 103.3 F (39.6 C), resp. rate (!) 23, height $RemoveBe'5\' 1"'lexDNOLMJ$  (1.549 m), weight 68 kg, SpO2 99 %. CBC WBC 13.3, hemoglobin 10.4, 11.5, CMP: Creatinine 1.38, blood glucose level 170, UA moderate hemoglobin, positive nitrites, large leukocyte esterase,> 50 WBCs  EKG: V. tach heart rate of 150, QTc 446,  CT abdomen/pelvis: IMPRESSION: 1. Mild right-sided hydronephrosis with urothelial thickening and perinephric/periureteric stranding. No renal, ureteral or bladder calculi identified. Findings may represent sequela of a recently passed stone versus ascending urinary tract infection. Correlate with  urinalysis. 2. 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 3.  Aortic Atherosclerosis   Patient was given 2 L, of normal saline, Toradol, Zofran, Maalox, Tylenol and IV Rocephin  Requested patient to be admitted for SIRS -ruling out sepsis, pyelonephritis    Assessment & Plan:   Principal Problem:   Acute liver failure Active Problems:   Sepsis ([place])   Acute pyelonephritis   Type 2 diabetes  mellitus with hyperosmolar nonketotic hyperglycemia (HCC)   Multifocal atrial tachycardia (HCC)   Hypomagnesemia   Iron deficiency anemia   HTN (hypertension)   Substance abuse (HCC)   AKI (acute kidney injury) ([place])     Assessment and Plan: * Acute liver failure - Patient has developed acute liver failure -Acute transaminitis: Elevated LFTs,    Latest Ref Rng & Units 06/11/2021    4:37 AM 06/10/2021    4:32 AM 06/09/2021    3:55 AM  Hepatic Function  Total Protein 6.5 - 8.1 g/dL 7.0   6.4   6.8    Albumin 3.5 - 5.0 g/dL 2.5   2.2   2.4    AST 15 - 41 U/L 3,834   178   18    ALT 0 - 44 U/L 1,527   102   13    Alk Phosphatase 38 - 126 U/L 219   140   108    Total Bilirubin 0.3 - 1.2 mg/dL 0.5   0.2   0.3      Increasing IV fluid resuscitation with LR -Hepatitis panel all negative -Avoiding all hepatotoxins including Tylenol -Consulting GI for further evaluation recommendations    Acute pyelonephritis - CT abdomen pelvis reviewed:  IMPRESSION: 1. Mild right-sided hydronephrosis with urothelial thickening  and perinephric/periureteric stranding. No renal, ureteral or bladder calculi identified. Findings may represent sequela of a recently passed stone versus ascending urinary tract infection. Correlate with urinalysis. 2. 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 3.  Aortic Atherosclerosis  -Continue treatment per sepsis protocol IV fluids, IV antibiotics, Nausea note closely,  Sepsis ([place]) -Sepsis physiology resolved with exception of acute transaminitis, liver failure  WBC: 13.3, 19.8, 14.8 >> 10.8  today -Lactic acid 1.1, 0.2, 1.9   - On arrival was  meeting SIRS criteria, BP(!) 168/111, pulse (!) 145, TMAX  (!) 103.3 F (39.6 C), resp. rate (!) 23, height 5' 1"  (1.549 m), weight 68 kg, SpO2 99 %.  -Likely source UTI, pyelonephritis -Urine culture growing E. coli, ESBL,  Biotics switched from Rocephin to meropenem Blood cultures: 1/2   growing bacillus species -Repeat blood cultures ordered today 06/10/2021  Status post aggressive IV fluid resuscitation with LR continue maintenance   -CT abdomen pelvis reviewed: Revealing mild right-sided hydronephrosis    Iron deficiency anemia - Monitoring hemoglobin  -Hemoglobin 9.0 >>> 7.4 -As of bleeding status post aggressive IV fluid resuscitation, likely delusional -Pending iron studies, Hemoccult  Hypomagnesemia - Status post IV  2 g of magnesium  Multifocal atrial tachycardia (HCC) -Improved - Ventricular tachycardia noted on arrival EKG reading heart rate 150, PR 66, QRS duration 88, QTc 446, Sinus ventricular tachycardia with PVCs -Due to SIRS/sepsis -TSH 0.701 within normal limits -Continue IV fluid resuscitation, trend electrolytes, potassium 3.9,  -Continue repleting electrolytes including magnesium -Closely, patient's home medication metoprolol has been increased from 50 to 100 mg p.o. twice daily  Type 2 diabetes mellitus with hyperosmolar nonketotic hyperglycemia (HCC) - Home medication reviewed, long-acting insulin 10 units nightly, metformin 1 g twice daily - Holding home medication of metformin, -Resuming long-acting insulin (substituting for formulary) -Checking blood sugar QA CHS, with SSI coverage -A1c:   Transaminitis-resolved as of 06/11/2021 - Acute elevation of alk phos 140 LFTs, AST 178, ALT 102,  -Likely due to sepsis, -Continue IV fluid rehydration -Hepatitis panel  HTN (hypertension) - Hypertensive..  On admission blood pressure as high as 198/121, currently 168/111 -Holding home medication losartan -AKI, sepsis -Increased her metoprolol from 50 to 100 mg p.o. twice daily -Continue as needed hydralazine   AKI (acute kidney injury) (HCC) - BUN 12, creatinine 1.38 >>> 1.77 >> 1.65 >> 1.80 BUN 30, GFR 34, -Mild AKI likely due to hypotension-SIRS/sepsis We will continue IV fluid hydration, avoiding nephrotoxins  Substance abuse  ([place]) - Admitted to using cocaine 3 days prior to day of admission -Was advised to abstain from any drug use and abuse, -Monitoring for signs symptoms of withdrawal    --------------------------------------------------------------------------------------------------------- Cultures; Blood Cultures x 1/2 >> Bacillus species -likely contaminant Repeating blood cultures 06/10/2021>>>  Urine Culture  >>> E. coli, ESBL, multi drug-resistant   -------------------------------------------------------------------------------------------------------------------------------  DVT prophylaxis:  heparin injection 5,000 Units Start: 06/08/21 1400 TED hose Start: 06/08/21 1354 SCDs Start: 06/08/21 1354   Code Status:   Code Status: Full Code  Family Communication: No family present, discussed with patient  the above findings and plan of care has been discussed with patient  in detail,  they expressed understanding and agreement of above. -Advance care planning has been discussed.   Admission status:   Status is: Inpatient Remains inpatient appropriate because: Meeting sepsis criteria needing IV fluids, IV antibiotics     Procedures:   No admission procedures for hospital encounter.   Antimicrobials:  Anti-infectives (From admission, onward)  Start     Dose/Rate Route Frequency Ordered Stop   06/10/21 1000  meropenem (MERREM) 1 g in sodium chloride 0.9 % 100 mL IVPB        1 g 200 mL/hr over 30 Minutes Intravenous Every 12 hours 06/10/21 0844     06/09/21 1200  cefTRIAXone (ROCEPHIN) 2 g in sodium chloride 0.9 % 100 mL IVPB  Status:  Discontinued        2 g 200 mL/hr over 30 Minutes Intravenous Every 24 hours 06/08/21 1356 06/10/21 0844   06/08/21 1430  cefTRIAXone (ROCEPHIN) 1 g in sodium chloride 0.9 % 100 mL IVPB        1 g 200 mL/hr over 30 Minutes Intravenous  Once 06/08/21 1359 06/08/21 1730   06/08/21 1215  cefTRIAXone (ROCEPHIN) 1 g in sodium chloride 0.9 % 100 mL IVPB         1 g 200 mL/hr over 30 Minutes Intravenous  Once 06/08/21 1203 06/08/21 1253        Medication:   acidophilus  1 capsule Oral TID WC   heparin  5,000 Units Subcutaneous Q8H   insulin aspart  0-9 Units Subcutaneous TID WC   insulin glargine-yfgn  10 Units Subcutaneous Daily   metoprolol tartrate  100 mg Oral BID   sodium chloride flush  3 mL Intravenous Q12H   sodium chloride flush  3 mL Intravenous Q12H    sodium chloride, ALPRAZolam, bisacodyl, hydrALAZINE, HYDROmorphone (DILAUDID) injection, ipratropium, ketorolac, levalbuterol, metoprolol tartrate, ondansetron **OR** ondansetron (ZOFRAN) IV, oxyCODONE, senna-docusate, sodium chloride flush, traZODone   Objective:   Vitals:   06/10/21 2107 06/11/21 0428 06/11/21 0500 06/11/21 0818  BP: (!) 145/96 (!) 145/100  (!) 158/107  Pulse: 99 92  93  Resp: 16 16    Temp: 100.1 F (37.8 C) 98.8 F (37.1 C)    TempSrc: Oral     SpO2: 99% 100%    Weight:   80.3 kg   Height:        Intake/Output Summary (Last 24 hours) at 06/11/2021 1039 Last data filed at 06/10/2021 1700 Gross per 24 hour  Intake 698.8 ml  Output --  Net 698.8 ml   Filed Weights   06/08/21 0747 06/10/21 0434 06/11/21 0500  Weight: 68 kg 79.2 kg 80.3 kg     Examination:    General:  AAO x 3,  cooperative, no distress;   HEENT:  Normocephalic, PERRL, otherwise with in Normal limits   Neuro:  CNII-XII intact. , normal motor and sensation, reflexes intact   Lungs:   Clear to auscultation BL, Respirations unlabored,  No wheezes / crackles  Cardio:    S1/S2, RRR, No murmure, No Rubs or Gallops   Abdomen:  Soft, non-tender, bowel sounds active all four quadrants, no guarding or peritoneal signs.  Muscular  skeletal:  Limited exam -global generalized weaknesses - in bed, able to move all 4 extremities,   2+ pulses,  symmetric, No pitting edema  Skin:  Dry, warm to touch, negative for any Rashes,  Wounds: Please see nursing documentation           LABs:     Latest Ref Rng & Units 06/11/2021    4:44 AM 06/10/2021    4:32 AM 06/09/2021    3:55 AM  CBC  WBC 4.0 - 10.5 K/uL 10.8   14.8   19.8    Hemoglobin 12.0 - 15.0 g/dL 8.1   7.4   9.0    Hematocrit 36.0 -  46.0 % 24.8   24.2   29.2    Platelets 150 - 400 K/uL 501   462   554        Latest Ref Rng & Units 06/11/2021    4:37 AM 06/10/2021    4:32 AM 06/09/2021    3:55 AM  CMP  Glucose 70 - 99 mg/dL 102   124   187    BUN 6 - 20 mg/dL 30   21   17     Creatinine 0.44 - 1.00 mg/dL 1.80   1.65   1.71    Sodium 135 - 145 mmol/L 133   137   136    Potassium 3.5 - 5.1 mmol/L 4.6   4.4   4.8    Chloride 98 - 111 mmol/L 105   108   108    CO2 22 - 32 mmol/L 21   21   22     Calcium 8.9 - 10.3 mg/dL 8.0   8.1   8.0    Total Protein 6.5 - 8.1 g/dL 7.0   6.4   6.8    Total Bilirubin 0.3 - 1.2 mg/dL 0.5   0.2   0.3    Alkaline Phos 38 - 126 U/L 219   140   108    AST 15 - 41 U/L 3,834   178   18    ALT 0 - 44 U/L 1,527   102   13         Micro Results Recent Results (from the past 240 hour(s))  Urine Culture     Status: Abnormal   Collection Time: 06/08/21  9:03 AM   Specimen: Urine, Clean Catch  Result Value Ref Range Status   Specimen Description   Final    URINE, CLEAN CATCH Performed at [facility], [address], [place], [place] [address]    Special Requests   Final    NONE Performed at [facility], [address] [place], [place] [address]    Culture (A)  Final    >=100,000 COLONIES/mL ESCHERICHIA COLI Confirmed Extended Spectrum Beta-Lactamase Producer (ESBL).  In bloodstream infections from ESBL organisms, carbapenems are preferred over piperacillin/tazobactam. They are shown to have a lower risk of mortality.    Report Status 06/10/2021 FINAL  Final   Organism ID, Bacteria ESCHERICHIA COLI (A)  Final      Susceptibility   Escherichia coli - MIC*    AMPICILLIN >=32 RESISTANT Resistant     CEFAZOLIN >=64 RESISTANT Resistant     CEFEPIME 0.5 SENSITIVE  Sensitive     CEFTRIAXONE 32 RESISTANT Resistant     CIPROFLOXACIN >=4 RESISTANT Resistant     GENTAMICIN <=1 SENSITIVE Sensitive     IMIPENEM <=0.25 SENSITIVE Sensitive     NITROFURANTOIN <=16 SENSITIVE Sensitive     TRIMETH/SULFA >=320 RESISTANT Resistant     AMPICILLIN/SULBACTAM 4 SENSITIVE Sensitive     PIP/TAZO <=4 SENSITIVE Sensitive     * >=100,000 COLONIES/mL ESCHERICHIA COLI  Culture, blood (routine x 2)     Status: None (Preliminary result)   Collection Time: 06/08/21  1:58 PM   Specimen: BLOOD LEFT FOREARM  Result Value Ref Range Status   Specimen Description BLOOD LEFT FOREARM  Final   Special Requests   Final    BOTTLES DRAWN AEROBIC ONLY Blood Culture adequate volume   Culture   Final    NO GROWTH 3 DAYS Performed at [facility], [address] [place], [place] [address]  Report Status PENDING  Incomplete  Culture, blood (routine x 2)     Status: Abnormal   Collection Time: 06/08/21  2:05 PM   Specimen: BLOOD RIGHT HAND  Result Value Ref Range Status   Specimen Description   Final    BLOOD RIGHT HAND Performed at [facility], [address] [place], [place] [address]    Special Requests   Final    BOTTLES DRAWN AEROBIC AND ANAEROBIC Blood Culture adequate volume Performed at [facility], [address] [place], [place] [address]    Culture  Setup Time   Final    GRAM POSITIVE RODS ANAEROBIC BOTTLE Gram Stain Report Called to,Read Back By and Verified With: [email or web address]  BY MATTHEWS, B 5.25.2023 Performed at [facility], [address], [place], [place]    Culture (A)  Final    BACILLUS SPECIES Standardized susceptibility testing for this organism is not available. Performed at [facility], [place] [address], [place], [place]    Report Status 06/11/2021 FINAL  Final  MRSA Next Gen by PCR, Nasal     Status: None   Collection Time: 06/08/21  3:15 PM   Specimen: Nasal Mucosa; Nasal Swab  Result Value Ref Range Status   MRSA by  PCR Next Gen NOT DETECTED NOT DETECTED Final    Comment: (NOTE) The GeneXpert MRSA Assay (FDA approved for NASAL specimens only), is one component of a comprehensive MRSA colonization surveillance program. It is not intended to diagnose MRSA infection nor to guide or monitor treatment for MRSA infections. Test performance is not FDA approved in patients less than 33 years old. Performed at [facility], [address], [place], [place]   Culture, blood (Routine X 2) w Reflex to ID Panel     Status: None (Preliminary result)   Collection Time: 06/10/21  1:03 PM   Specimen: Left Antecubital; Blood  Result Value Ref Range Status   Specimen Description   Final    LEFT ANTECUBITAL BOTTLES DRAWN AEROBIC AND ANAEROBIC   Special Requests Blood Culture adequate volume  Final   Culture   Final    NO GROWTH < 12 HOURS Performed at [facility], [address], [place], [place]    Report Status PENDING  Incomplete  Culture, blood (Routine X 2) w Reflex to ID Panel     Status: None (Preliminary result)   Collection Time: 06/10/21  1:03 PM   Specimen: BLOOD LEFT HAND  Result Value Ref Range Status   Specimen Description   Final    BLOOD LEFT HAND BOTTLES DRAWN AEROBIC AND ANAEROBIC   Special Requests Blood Culture adequate volume  Final   Culture   Final    NO GROWTH < 12 HOURS Performed at [facility], [address] [place],  [place]    Report Status PENDING  Incomplete    Radiology Reports No results found.  SIGNED: [id number], MD, FHM. [facility],  Pager (please use [email or web address] to page/text) Please use Epic Secure Chat for non-urgent communication (7AM-7PM)  If 7PM-7AM, please contact night-coverage [email or web address], 06/11/2021, 10:39 AM

## 2021-06-11 NOTE — Consult Note (Signed)
Referring Provider: No ref. provider found Primary Care Physician:  Pcp, No Primary Gastroenterologist:  Dr. Berlyn Saylor  Reason for Consultation: Elevated liver enzymes  HPI: 51 year old lady with iJena Gaussnsulin-dependent diabetes, substance abuse admitted to the hospital 5/24 with E. coli pyelonephritis and gram-negative bacteremia.  Transaminases normal on 5/25 (ALT 13, AST 18), on 526 ALT and AST 102, 178, respectively; today ALT 1527, AST 3834; bilirubin 0.5 today. Acute viral hepatitis panel has come back negative for A, B and C.   INR 1.13 days ago; today 1.4 CT of the abdomen on admission demonstrated hydronephrosis and right perinephric stranding.  Liver and biliary tree appeared normal (gallbladder in situ) A repeat CT of the abdomen is pending. Patient denies acetaminophen ingestion although; she readily admits to taking "lots of ibuprofen". Specifically, she endorses taking 8-200 mg ibuprofen tablets 2-3 times a day for musculoskeletal pain on a regular basis.  I note that she has been getting IV Toradol for pain control this admission. Rarely consumes alcohol.  Likes to smoke cocaine. In review of her hospital course thus far, there have been no episodes of hypotension.  In fact, he has been relatively hypertensive. She denies nausea, vomiting, melena or hematochezia  She denies any prior history of hepatitis.  Denies family history of liver disease.  Past Medical History:  Diagnosis Date   Diabetes mellitus without complication (HCC)     Past Surgical History:  Procedure Laterality Date   TUBAL LIGATION      Prior to Admission medications   Medication Sig Start Date End Date Taking? Authorizing Provider  insulin glargine (LANTUS SOLOSTAR) 100 UNIT/ML Solostar Pen Inject 10 Units into the skin daily. 03/08/21  Yes Jacquelin HawkingMcElroy, Shannon, PA-C  losartan (COZAAR) 25 MG tablet Take 1 tablet (25 mg total) by mouth daily. 02/23/21  Yes Noralee Stainhoi, Jennifer, DO  metFORMIN (GLUCOPHAGE) 1000 MG  tablet Take 1 tablet (1,000 mg total) by mouth 2 (two) times daily with a meal. 02/23/21  Yes Noralee Stainhoi, Jennifer, DO  metoprolol tartrate (LOPRESSOR) 50 MG tablet Take 1 tablet (50 mg total) by mouth 2 (two) times daily. 03/08/21  Yes Jacquelin HawkingMcElroy, Shannon, PA-C    Current Facility-Administered Medications  Medication Dose Route Frequency Provider Last Rate Last Admin   0.9 %  sodium chloride infusion  250 mL Intravenous PRN Shahmehdi, Seyed A, MD       acidophilus (RISAQUAD) capsule 1 capsule  1 capsule Oral TID WC Shahmehdi, Seyed A, MD   1 capsule at 06/11/21 1230   ALPRAZolam (XANAX) tablet 0.5 mg  0.5 mg Oral TID PRN Shahmehdi, Gemma PayorSeyed A, MD       bisacodyl (DULCOLAX) EC tablet 5 mg  5 mg Oral Daily PRN Shahmehdi, Seyed A, MD       heparin injection 5,000 Units  5,000 Units Subcutaneous Q8H Shahmehdi, Seyed A, MD   5,000 Units at 06/11/21 1230   hydrALAZINE (APRESOLINE) injection 10 mg  10 mg Intravenous Q4H PRN Shahmehdi, Seyed A, MD   10 mg at 06/08/21 1851   HYDROmorphone (DILAUDID) injection 0.5-1 mg  0.5-1 mg Intravenous Q2H PRN Shahmehdi, Seyed A, MD       insulin aspart (novoLOG) injection 0-9 Units  0-9 Units Subcutaneous TID WC Shahmehdi, Seyed A, MD   1 Units at 06/10/21 0836   insulin glargine-yfgn (SEMGLEE) injection 10 Units  10 Units Subcutaneous Daily Nevin BloodgoodShahmehdi, Seyed A, MD   10 Units at 06/11/21 0823   iohexol (OMNIPAQUE) 9 MG/ML oral solution  ipratropium (ATROVENT) nebulizer solution 0.5 mg  0.5 mg Nebulization Q6H PRN Shahmehdi, Seyed A, MD       ketorolac (TORADOL) 30 MG/ML injection 30 mg  30 mg Intravenous Q6H PRN Shahmehdi, Seyed A, MD   30 mg at 06/09/21 1600   lactated ringers infusion   Intravenous Continuous Shahmehdi, Seyed A, MD 150 mL/hr at 06/11/21 1231 New Bag at 06/11/21 1231   levalbuterol (XOPENEX) nebulizer solution 0.63 mg  0.63 mg Nebulization Q6H PRN Shahmehdi, Seyed A, MD       meropenem (MERREM) 1 g in sodium chloride 0.9 % 100 mL IVPB  1 g Intravenous  Q12H Shahmehdi, Seyed A, MD 200 mL/hr at 06/11/21 0826 1 g at 06/11/21 0826   metoprolol tartrate (LOPRESSOR) injection 5 mg  5 mg Intravenous Q6H PRN Nevin Bloodgood A, MD   5 mg at 06/08/21 1917   metoprolol tartrate (LOPRESSOR) tablet 100 mg  100 mg Oral BID Shahmehdi, Seyed A, MD   100 mg at 06/11/21 0818   ondansetron (ZOFRAN) tablet 4 mg  4 mg Oral Q6H PRN Shahmehdi, Seyed A, MD       Or   ondansetron (ZOFRAN) injection 4 mg  4 mg Intravenous Q6H PRN Shahmehdi, Seyed A, MD       oxyCODONE (Oxy IR/ROXICODONE) immediate release tablet 5 mg  5 mg Oral Q4H PRN Shahmehdi, Seyed A, MD       senna-docusate (Senokot-S) tablet 1 tablet  1 tablet Oral QHS PRN Shahmehdi, Seyed A, MD       sodium chloride flush (NS) 0.9 % injection 3 mL  3 mL Intravenous Q12H Shahmehdi, Seyed A, MD   3 mL at 06/11/21 0826   sodium chloride flush (NS) 0.9 % injection 3 mL  3 mL Intravenous Q12H Shahmehdi, Seyed A, MD   3 mL at 06/11/21 0826   sodium chloride flush (NS) 0.9 % injection 3 mL  3 mL Intravenous PRN Nevin Bloodgood A, MD       traZODone (DESYREL) tablet 25 mg  25 mg Oral QHS PRN Kendell Bane, MD        Allergies as of 06/08/2021   (No Known Allergies)    Family History  Problem Relation Age of Onset   Heart disease Mother    Diabetes Mother     Social History   Socioeconomic History   Marital status: Single    Spouse name: Not on file   Number of children: Not on file   Years of education: Not on file   Highest education level: Not on file  Occupational History   Not on file  Tobacco Use   Smoking status: Every Day    Packs/day: 0.50    Types: Cigarettes   Smokeless tobacco: Never  Vaping Use   Vaping Use: Never used  Substance and Sexual Activity   Alcohol use: Not Currently    Comment: last use Feb 2023   Drug use: Not Currently    Types: Marijuana, Cocaine    Comment: last use Feb 2023   Sexual activity: Yes    Birth control/protection: Surgical  Other Topics Concern    Not on file  Social History Narrative   Not on file   Social Determinants of Health   Financial Resource Strain: Not on file  Food Insecurity: Not on file  Transportation Needs: Not on file  Physical Activity: Not on file  Stress: Not on file  Social Connections: Not on file  Intimate Partner Violence:  Not on file    Review of Systems:  As in history of present illness.  Physical Exam: Vital signs in last 24 hours: Temp:  [98.8 F (37.1 C)-100.1 F (37.8 C)] 98.8 F (37.1 C) (05/27 0428) Pulse Rate:  [92-99] 93 (05/27 0818) Resp:  [16] 16 (05/27 0428) BP: (145-158)/(96-107) 158/107 (05/27 0818) SpO2:  [99 %-100 %] 100 % (05/27 0428) Weight:  [80.3 kg] 80.3 kg (05/27 0500) Last BM Date : 06/08/21 General:   Alert,  pleasant and cooperative in NAD.  No asterixis Head:  Normocephalic and atraumatic. Lungs:  Clear throughout to auscultation.   No wheezes, crackles, or rhonchi. No acute distress. Heart:  Regular rate and rhythm; no murmurs, clicks, rubs,  or gallops. Abdomen:  Soft, nontender and nondistended. No masses, hepatosplenomegaly or hernias noted. Normal bowel sounds, without guarding, and without rebound.    Intake/Output from previous day: 05/26 0701 - 05/27 0700 In: 698.8 [P.O.:480; I.V.:118.8; IV Piggyback:100] Out: -  Intake/Output this shift: Total I/O In: 350 [P.O.:350] Out: -   Lab Results: Recent Labs    06/09/21 0355 06/10/21 0432 06/11/21 0444  WBC 19.8* 14.8* 10.8*  HGB 9.0* 7.4* 8.1*  HCT 29.2* 24.2* 24.8*  PLT 554* 462* 501*   BMET Recent Labs    06/09/21 0355 06/10/21 0432 06/11/21 0437  NA 136 137 133*  K 4.8 4.4 4.6  CL 108 108 105  CO2 22 21* 21*  GLUCOSE 187* 124* 102*  BUN 17 21* 30*  CREATININE 1.71* 1.65* 1.80*  CALCIUM 8.0* 8.1* 8.0*   LFT Recent Labs    06/11/21 0437  PROT 7.0  ALBUMIN 2.5*  AST 3,834*  ALT 1,527*  ALKPHOS 219*  BILITOT 0.5   PT/INR Recent Labs    06/08/21 1405 06/11/21 1110   LABPROT 13.8 17.0*  INR 1.1 1.4*   Hepatitis Panel Recent Labs    06/10/21 0550  HEPBSAG NON REACTIVE  HCVAB NON REACTIVE  HEPAIGM NON REACTIVE  HEPBIGM NON REACTIVE   Impression: This nice 51 year old lady admitted to the hospital with right-sided pyelonephritis and reported bacteremia - now has new onset acute hepatocellular injury. Viral hepatitis markers have come back negative. No documentation of hypotension to go with a shock liver scenario. Hepatotoxicity secondary to medication (NSAID induced versus other agent) not ruled out. Clinical scenario not consistent with acute biliary obstruction.  Moreover, this would be an unusual presentation for autoimmune hepatitis. Uptick in INR is a little concerning.  She has biochemical evidence of acute hepatotoxicity but clinically does not have acute liver failure at this time. She needs to be screened for acute HSV infection.  Recommendations:  Vitamin K 10 mg IV x1 dose  Repeat INR, hepatic function profile in the morning  Will draw autoimmune markers, HSV PCR, serum ceruloplasmin  Would consider a course IV Mucomyst (for non-acetaminophen hepatotoxicity) if INR, transaminases continue to climb.  Would discontinue NSAID agents in her regimen in an abundance of caution.  Further recommendations to follow.     Notice:  This dictation was prepared with Dragon dictation along with smaller phrase technology. Any transcriptional errors that result from this process are unintentional and may not be corrected upon review.

## 2021-06-12 LAB — COMPREHENSIVE METABOLIC PANEL
ALT: 999 U/L — ABNORMAL HIGH (ref 0–44)
AST: 1007 U/L — ABNORMAL HIGH (ref 15–41)
Albumin: 2.1 g/dL — ABNORMAL LOW (ref 3.5–5.0)
Alkaline Phosphatase: 195 U/L — ABNORMAL HIGH (ref 38–126)
Anion gap: 6 (ref 5–15)
BUN: 26 mg/dL — ABNORMAL HIGH (ref 6–20)
CO2: 21 mmol/L — ABNORMAL LOW (ref 22–32)
Calcium: 7.9 mg/dL — ABNORMAL LOW (ref 8.9–10.3)
Chloride: 108 mmol/L (ref 98–111)
Creatinine, Ser: 1.42 mg/dL — ABNORMAL HIGH (ref 0.44–1.00)
GFR, Estimated: 46 mL/min — ABNORMAL LOW (ref >60)
Glucose, Bld: 133 mg/dL — ABNORMAL HIGH (ref 70–99)
Potassium: 4.2 mmol/L (ref 3.5–5.1)
Sodium: 135 mmol/L (ref 135–145)
Total Bilirubin: 0.5 mg/dL (ref 0.3–1.2)
Total Protein: 6.1 g/dL — ABNORMAL LOW (ref 6.5–8.1)

## 2021-06-12 LAB — HEMOGLOBIN A1C
Hgb A1c MFr Bld: 8.4 % — ABNORMAL HIGH (ref 4.8–5.6)
Mean Plasma Glucose: 194.38 mg/dL

## 2021-06-12 LAB — GLUCOSE, CAPILLARY
Glucose-Capillary: 123 mg/dL — ABNORMAL HIGH (ref 70–99)
Glucose-Capillary: 169 mg/dL — ABNORMAL HIGH (ref 70–99)
Glucose-Capillary: 125 mg/dL — ABNORMAL HIGH (ref 70–99)

## 2021-06-12 LAB — PROTIME-INR
INR: 1.4 — ABNORMAL HIGH (ref 0.8–1.2)
Prothrombin Time: 16.6 seconds — ABNORMAL HIGH (ref 11.4–15.2)

## 2021-06-12 MED ORDER — BISACODYL 5 MG PO TBEC
5.0000 mg | DELAYED_RELEASE_TABLET | Freq: Every day | ORAL | Status: DC
  Administered 2021-06-12 – 2021-06-13 (×2): 5 mg via ORAL
  Filled 2021-06-12 (×2): qty 1

## 2021-06-12 NOTE — Progress Notes (Signed)
PROGRESS NOTE    Patient: Megan Mullins                            PCP: Pcp, No                    DOB: 25-May-1970            DOA: 06/08/2021 LYY:[id number]             DOS: 06/12/2021, 12:17 PM   LOS: 4 days   Date of Service: The patient was seen and examined on 06/12/2021  Subjective:   The patient was seen and examined this morning, stable no acute distress, not complaining of abdominal pain nausea vomiting... Reporting improved dysuria  Noted for urine culture growing E. coli, ESBL, 1 out of 2 blood culture growing bacillus species Acute-markedly elevated LFTs--mild improvement noted  Brief Narrative:     Megan Mullins is a 51 year old female with a history of hypertension, diabetes mellitus type II, presenting fever, abdominal pain, nausea, vomiting... Worsening symptoms over the past 5 hours.   ED:  Blood pressure (!) 168/111, pulse (!) 145, temperature (!) 103.3 F (39.6 C), resp. rate (!) 23, height $RemoveBe'5\' 1"'OLQcDAQag$  (1.549 m), weight 68 kg, SpO2 99 %. CBC WBC 13.3, hemoglobin 10.4, 11.5, CMP: Creatinine 1.38, blood glucose level 170, UA moderate hemoglobin, positive nitrites, large leukocyte esterase,> 50 WBCs  EKG: V. tach heart rate of 150, QTc 446,  CT abdomen/pelvis: IMPRESSION: 1. Mild right-sided hydronephrosis with urothelial thickening and perinephric/periureteric stranding. No renal, ureteral or bladder calculi identified. Findings may represent sequela of a recently passed stone versus ascending urinary tract infection. Correlate with  urinalysis. 2. 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 3.  Aortic Atherosclerosis   Patient was given 2 L, of normal saline, Toradol, Zofran, Maalox, Tylenol and IV Rocephin  Requested patient to be admitted for SIRS -ruling out sepsis, pyelonephritis    Assessment & Plan:   Principal Problem:   Acute liver failure Active Problems:   Sepsis ([place])   Acute pyelonephritis   Type 2 diabetes mellitus with  hyperosmolar nonketotic hyperglycemia (HCC)   Multifocal atrial tachycardia (HCC)   Hypomagnesemia   Iron deficiency anemia   HTN (hypertension)   Substance abuse (HCC)   AKI (acute kidney injury) ([place])     Assessment and Plan: * Acute liver failure - Patient has developed acute liver failure Unknown etiology possible sepsis   -Acute transaminitis: Elevated LFTs,    Latest Ref Rng & Units 06/12/2021    3:58 AM 06/11/2021    4:37 AM 06/10/2021    4:32 AM  Hepatic Function  Total Protein 6.5 - 8.1 g/dL 6.1   7.0   6.4    Albumin 3.5 - 5.0 g/dL 2.1   2.5   2.2    AST 15 - 41 U/L 1,007   3,834   178    ALT 0 - 44 U/L 999   1,527   102    Alk Phosphatase 38 - 126 U/L 195   219   140    Total Bilirubin 0.3 - 1.2 mg/dL 0.5   0.5   0.2      Increasing IV fluid resuscitation with LR -Hepatitis panel all negative -Avoiding all hepatotoxins including Tylenol -Consulting GI -appreciate follow-up and further recommendations    Acute pyelonephritis - CT abdomen pelvis reviewed:  IMPRESSION: 1. Mild right-sided hydronephrosis with urothelial thickening and  perinephric/periureteric stranding. No renal, ureteral or bladder calculi identified. Findings may represent sequela of a recently passed stone versus ascending urinary tract infection. Correlate with urinalysis. 2. 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 3.  Aortic Atherosclerosis  -Continue treatment per sepsis protocol IV fluids, IV antibiotics, Nausea note closely,  Sepsis ([place]) -Improved sepsis physiology with exception of elevated LFTs  WBC: 13.3, 19.8, 14.8 >> 10.8   -Lactic acid 1.1, 0.2, 1.9   - On arrival was  meeting SIRS criteria, BP(!) 168/111, pulse (!) 145, TMAX  (!) 103.3 F (39.6 C), resp. rate (!) 23, height 5' 1"  (1.549 m), weight 68 kg, SpO2 99 %.  -Likely source UTI, pyelonephritis -Urine culture growing E. coli, ESBL,  Biotics switched from Rocephin to meropenem Blood  cultures: 1/2  growing bacillus species -Repeat blood cultures ordered today 06/10/2021  Status post aggressive IV fluid resuscitation with LR continue maintenance   -CT abdomen pelvis reviewed: Revealing mild right-sided hydronephrosis    Iron deficiency anemia - Monitoring hemoglobin  -Hemoglobin 9.0 >>> 7.4 -As of bleeding status post aggressive IV fluid resuscitation, likely delusional -Pending iron studies, Hemoccult  Hypomagnesemia - Status post IV  2 g of magnesium  Multifocal atrial tachycardia ([place]) -Improved -On arrival EKG reading heart rate 150, PR 66, QRS duration 88, QTc 446, Sinus ventricular tachycardia with PVCs -Due to SIRS/sepsis -TSH 0.701 within normal limits -Status post IV fluid resuscitation, electrolyte repletion - increased from 50 to 100 mg p.o. twice daily  Type 2 diabetes mellitus with hyperosmolar nonketotic hyperglycemia (HCC) - Home medication reviewed, long-acting insulin 10 units nightly, metformin 1 g twice daily - Holding home medication of metformin, -Resuming long-acting insulin (substituting for formulary) -Checking blood sugar QA CHS, with SSI coverage    Transaminitis-resolved as of 06/11/2021 - Acute elevation of alk phos 140 LFTs, AST 178, ALT 102,  -Likely due to sepsis, -Continue IV fluid rehydration -Hepatitis panel  HTN (hypertension) -Improved - Hypertensive..  On admission blood pressure as high as 198/121, currently 168/111 -Holding home medication losartan -AKI, sepsis -Increased her metoprolol from 50 to 100 mg p.o. twice daily -Continue as needed hydralazine   AKI (acute kidney injury) (HCC) - BUN 12, creatinine 1.38 >>> 1.77 >> 1.65 >> 1.80 BUN 30, GFR 34, -Mild AKI likely due to hypotension-SIRS/sepsis We will continue IV fluid hydration, avoiding nephrotoxins  Substance abuse ([place]) - Admitted to using cocaine 3 days prior to day of admission -Was advised to abstain from any drug use and abuse, -Monitoring  for signs symptoms of withdrawal    --------------------------------------------------------------------------------------------------------- Cultures; Blood Cultures x 1/2 >> Bacillus species -likely contaminant Repeating blood cultures 06/10/2021>>>  Urine Culture  >>> E. coli, ESBL, multi drug-resistant   -------------------------------------------------------------------------------------------------------------------------------  DVT prophylaxis:  heparin injection 5,000 Units Start: 06/08/21 1400 TED hose Start: 06/08/21 1354 SCDs Start: 06/08/21 1354   Code Status:   Code Status: Full Code  Family Communication: No family present, discussed with patient  the above findings and plan of care has been discussed with patient  in detail,  they expressed understanding and agreement of above. -Advance care planning has been discussed.   Admission status:   Status is: Inpatient Remains inpatient appropriate because: Meeting sepsis criteria needing IV fluids, IV antibiotics     Procedures:   No admission procedures for hospital encounter.   Antimicrobials:  Anti-infectives (From admission, onward)    Start     Dose/Rate Route Frequency Ordered Stop   06/10/21 1000  meropenem (MERREM) 1  g in sodium chloride 0.9 % 100 mL IVPB        1 g 200 mL/hr over 30 Minutes Intravenous Every 12 hours 06/10/21 0844     06/09/21 1200  cefTRIAXone (ROCEPHIN) 2 g in sodium chloride 0.9 % 100 mL IVPB  Status:  Discontinued        2 g 200 mL/hr over 30 Minutes Intravenous Every 24 hours 06/08/21 1356 06/10/21 0844   06/08/21 1430  cefTRIAXone (ROCEPHIN) 1 g in sodium chloride 0.9 % 100 mL IVPB        1 g 200 mL/hr over 30 Minutes Intravenous  Once 06/08/21 1359 06/08/21 1730   06/08/21 1215  cefTRIAXone (ROCEPHIN) 1 g in sodium chloride 0.9 % 100 mL IVPB        1 g 200 mL/hr over 30 Minutes Intravenous  Once 06/08/21 1203 06/08/21 1253        Medication:   acidophilus  1 capsule  Oral TID WC   bisacodyl  5 mg Oral Daily   heparin  5,000 Units Subcutaneous Q8H   insulin aspart  0-9 Units Subcutaneous TID WC   insulin glargine-yfgn  10 Units Subcutaneous Daily   metoprolol tartrate  100 mg Oral BID   sodium chloride flush  3 mL Intravenous Q12H   sodium chloride flush  3 mL Intravenous Q12H    sodium chloride, ALPRAZolam, hydrALAZINE, HYDROmorphone (DILAUDID) injection, ipratropium, ketorolac, levalbuterol, metoprolol tartrate, ondansetron **OR** ondansetron (ZOFRAN) IV, oxyCODONE, senna-docusate, sodium chloride flush, traZODone   Objective:   Vitals:   06/11/21 0818 06/11/21 1356 06/11/21 1954 06/12/21 0400  BP: (!) 158/107 (!) 145/100 (!) 144/94 (!) 151/105  Pulse: 93 88 93 82  Resp:  20 18 18   Temp:  98.4 F (36.9 C) 99.7 F (37.6 C) 98.1 F (36.7 C)  TempSrc:  Oral Oral Oral  SpO2:  100% 100% 100%  Weight:    80.3 kg  Height:        Intake/Output Summary (Last 24 hours) at 06/12/2021 1217 Last data filed at 06/11/2021 2247 Gross per 24 hour  Intake 937.67 ml  Output --  Net 937.67 ml   Filed Weights   06/10/21 0434 06/11/21 0500 06/12/21 0400  Weight: 79.2 kg 80.3 kg 80.3 kg     Examination:        General:  AAO x 3,  cooperative, no distress;   HEENT:  Normocephalic, PERRL, otherwise with in Normal limits   Neuro:  CNII-XII intact. , normal motor and sensation, reflexes intact   Lungs:   Clear to auscultation BL, Respirations unlabored,  No wheezes / crackles  Cardio:    S1/S2, RRR, No murmure, No Rubs or Gallops   Abdomen:  Soft, non-tender, bowel sounds active all four quadrants, no guarding or peritoneal signs.  Muscular  skeletal:  Limited exam -global generalized weaknesses - in bed, able to move all 4 extremities,   2+ pulses,  symmetric, No pitting edema  Skin:  Dry, warm to touch, negative for any Rashes,  Wounds: Please see nursing documentation           LABs:     Latest Ref Rng & Units 06/11/2021    4:44 AM  06/10/2021    4:32 AM 06/09/2021    3:55 AM  CBC  WBC 4.0 - 10.5 K/uL 10.8   14.8   19.8    Hemoglobin 12.0 - 15.0 g/dL 8.1   7.4   9.0    Hematocrit 36.0 - 46.0 %  24.8   24.2   29.2    Platelets 150 - 400 K/uL 501   462   554        Latest Ref Rng & Units 06/12/2021    3:58 AM 06/11/2021    4:37 AM 06/10/2021    4:32 AM  CMP  Glucose 70 - 99 mg/dL 133   102   124    BUN 6 - 20 mg/dL 26   30   21     Creatinine 0.44 - 1.00 mg/dL 1.42   1.80   1.65    Sodium 135 - 145 mmol/L 135   133   137    Potassium 3.5 - 5.1 mmol/L 4.2   4.6   4.4    Chloride 98 - 111 mmol/L 108   105   108    CO2 22 - 32 mmol/L 21   21   21     Calcium 8.9 - 10.3 mg/dL 7.9   8.0   8.1    Total Protein 6.5 - 8.1 g/dL 6.1   7.0   6.4    Total Bilirubin 0.3 - 1.2 mg/dL 0.5   0.5   0.2    Alkaline Phos 38 - 126 U/L 195   219   140    AST 15 - 41 U/L 1,007   3,834   178    ALT 0 - 44 U/L 999   1,527   102         Micro Results Recent Results (from the past 240 hour(s))  Urine Culture     Status: Abnormal   Collection Time: 06/08/21  9:03 AM   Specimen: Urine, Clean Catch  Result Value Ref Range Status   Specimen Description   Final    URINE, CLEAN CATCH Performed at [facility], [address], [place], [place] [address]    Special Requests   Final    NONE Performed at [facility], [address], [place], [place] [address]    Culture (A)  Final    >=100,000 COLONIES/mL ESCHERICHIA COLI Confirmed Extended Spectrum Beta-Lactamase Producer (ESBL).  In bloodstream infections from ESBL organisms, carbapenems are preferred over piperacillin/tazobactam. They are shown to have a lower risk of mortality.    Report Status 06/10/2021 FINAL  Final   Organism ID, Bacteria ESCHERICHIA COLI (A)  Final      Susceptibility   Escherichia coli - MIC*    AMPICILLIN >=32 RESISTANT Resistant     CEFAZOLIN >=64 RESISTANT Resistant     CEFEPIME 0.5 SENSITIVE Sensitive     CEFTRIAXONE 32 RESISTANT Resistant      CIPROFLOXACIN >=4 RESISTANT Resistant     GENTAMICIN <=1 SENSITIVE Sensitive     IMIPENEM <=0.25 SENSITIVE Sensitive     NITROFURANTOIN <=16 SENSITIVE Sensitive     TRIMETH/SULFA >=320 RESISTANT Resistant     AMPICILLIN/SULBACTAM 4 SENSITIVE Sensitive     PIP/TAZO <=4 SENSITIVE Sensitive     * >=100,000 COLONIES/mL ESCHERICHIA COLI  Culture, blood (routine x 2)     Status: None (Preliminary result)   Collection Time: 06/08/21  1:58 PM   Specimen: BLOOD LEFT FOREARM  Result Value Ref Range Status   Specimen Description BLOOD LEFT FOREARM  Final   Special Requests   Final    BOTTLES DRAWN AEROBIC ONLY Blood Culture adequate volume   Culture   Final    NO GROWTH 4 DAYS Performed at [facility], [address] [place], [place] [address]  Report Status PENDING  Incomplete  Culture, blood (routine x 2)     Status: Abnormal   Collection Time: 06/08/21  2:05 PM   Specimen: BLOOD RIGHT HAND  Result Value Ref Range Status   Specimen Description   Final    BLOOD RIGHT HAND Performed at [facility], [address] [place], [place] [address]    Special Requests   Final    BOTTLES DRAWN AEROBIC AND ANAEROBIC Blood Culture adequate volume Performed at [facility], [address] [place], [place] [address]    Culture  Setup Time   Final    GRAM POSITIVE RODS ANAEROBIC BOTTLE Gram Stain Report Called to,Read Back By and Verified With: [email or web address]  BY MATTHEWS, B 5.25.2023 Performed at [facility], [address], [place], [place] [address]    Culture (A)  Final    BACILLUS SPECIES Standardized susceptibility testing for this organism is not available. Performed at [facility], [place] [address] [place], [place] [address]    Report Status 06/11/2021 FINAL  Final  MRSA Next Gen by PCR, Nasal     Status: None   Collection Time: 06/08/21  3:15 PM   Specimen: Nasal Mucosa; Nasal Swab  Result Value Ref Range Status   MRSA by PCR Next Gen NOT DETECTED NOT DETECTED Final    Comment:  (NOTE) The GeneXpert MRSA Assay (FDA approved for NASAL specimens only), is one component of a comprehensive MRSA colonization surveillance program. It is not intended to diagnose MRSA infection nor to guide or monitor treatment for MRSA infections. Test performance is not FDA approved in patients less than 72 years old. Performed at [facility], [address], [place], [place] [address]   Culture, blood (Routine X 2) w Reflex to ID Panel     Status: None (Preliminary result)   Collection Time: 06/10/21  1:03 PM   Specimen: Left Antecubital; Blood  Result Value Ref Range Status   Specimen Description   Final    LEFT ANTECUBITAL BOTTLES DRAWN AEROBIC AND ANAEROBIC   Special Requests Blood Culture adequate volume  Final   Culture   Final    NO GROWTH 2 DAYS Performed at [facility], [address], [place], [place] [address]    Report Status PENDING  Incomplete  Culture, blood (Routine X 2) w Reflex to ID Panel     Status: None (Preliminary result)   Collection Time: 06/10/21  1:03 PM   Specimen: BLOOD LEFT HAND  Result Value Ref Range Status   Specimen Description   Final    BLOOD LEFT HAND BOTTLES DRAWN AEROBIC AND ANAEROBIC   Special Requests Blood Culture adequate volume  Final   Culture   Final    NO GROWTH 2 DAYS Performed at [facility], [address], [place],  [address]    Report Status PENDING  Incomplete    Radiology Reports CT ABDOMEN PELVIS WO CONTRAST  Result Date: 06/11/2021 CLINICAL DATA:  Increased upper abdominal pain and tenderness this morning. Admitted for acute liver failure, sepsis and pyelonephritis. Clinical concern for hepatomegaly. EXAM: CT ABDOMEN AND PELVIS WITHOUT CONTRAST TECHNIQUE: Multidetector CT imaging of the abdomen and pelvis was performed following the standard protocol without IV contrast. RADIATION DOSE REDUCTION: This exam was performed according to the departmental dose-optimization program which includes automated exposure  control, adjustment of the mA and/or kV according to patient size and/or use of iterative reconstruction technique. COMPARISON:  06/08/2021 FINDINGS: Lower chest: Interval small right pleural effusion and mild  right basilar dependent ground-glass opacity and linear density. Mildly enlarged are without gross change. Clear left lung base. Hepatobiliary: Stable prominence of the lateral segment of the left lobe of the liver and caudate lobe and normal-sized right lobe. A small cyst in the dome of the liver on the right was better visualized on the previous examination. Interval pericholecystic fluid and small amount of perihepatic fluid and soft tissue stranding. No gallbladder wall thickening or gallstones visualized. Pancreas: Unremarkable. No pancreatic ductal dilatation or surrounding inflammatory changes. Spleen: Normal in size without focal abnormality. Adrenals/Urinary Tract: The left adrenal gland remains diffusely prominent. Normal to slightly thickened right adrenal gland. No adrenal nodules are seen. The previously seen dilatation of the right renal collecting system is no longer demonstrated. Mild increase in bilateral perinephric soft tissue stranding and right paranephric fluid. No bladder or ureteral calculi and no hydronephrosis on either side. Stomach/Bowel: Small number of sigmoid colon diverticula without evidence of diverticulitis. Unremarkable stomach, small bowel and appendix. Vascular/Lymphatic: Mild atheromatous arterial calcifications. No enlarged lymph nodes. Reproductive: Uterus and bilateral adnexa are unremarkable. Other: Interval small amount of free peritoneal fluid constant true predominantly in the pelvis. Interval diffuse subcutaneous edema. Musculoskeletal: Lumbar and lower thoracic spine degenerative changes. Previously noted bilateral hip degenerative changes, greater on the left. IMPRESSION: 1. Interval diffuse anasarca with a small right pleural effusion, small amount of ascites  and diffuse subcutaneous edema. 2. Resolved right hydronephrosis. 3. Minimal pulmonary edema or dependent atelectasis in the right lower lobe. Electronically Signed   By: Claudie Revering M.D.   On: 06/11/2021 13:56    SIGNED: Deatra James, MD, FHM. [facility],  Pager (please use [email or web address] to page/text) Please use Epic Secure Chat for non-urgent communication (7AM-7PM)  If 7PM-7AM, please contact night-coverage [email or web address], 06/12/2021, 12:17 PM

## 2021-06-12 NOTE — Progress Notes (Signed)
Patient states she feels much better than yesterday.  Denies abdominal pain.  Tolerating diet.  Vital signs in last 24 hours: Temp:  [98.1 F (36.7 C)-99.7 F (37.6 C)] 98.1 F (36.7 C) (05/28 0400) Pulse Rate:  [82-93] 82 (05/28 0400) Resp:  [18-20] 18 (05/28 0400) BP: (144-151)/(94-105) 151/105 (05/28 0400) SpO2:  [100 %] 100 % (05/28 0400) Weight:  [80.3 kg] 80.3 kg (05/28 0400) Last BM Date : 06/12/21 General:   Alert,  pleasant and cooperative in NAD.  No flap. Abdomen: Full.  Positive bowel sounds soft nontender with the any appreciable hepatosplenomegaly.  No shifting dullness.  Intake/Output from previous day: 05/27 0701 - 05/28 0700 In: 1287.7 [P.O.:350; I.V.:687.7; IV Piggyback:250] Out: -  Intake/Output this shift: No intake/output data recorded.  Lab Results: Recent Labs    06/10/21 0432 06/11/21 0444  WBC 14.8* 10.8*  HGB 7.4* 8.1*  HCT 24.2* 24.8*  PLT 462* 501*   BMET Recent Labs    06/10/21 0432 06/11/21 0437 06/12/21 0358  NA 137 133* 135  K 4.4 4.6 4.2  CL 108 105 108  CO2 21* 21* 21*  GLUCOSE 124* 102* 133*  BUN 21* 30* 26*  CREATININE 1.65* 1.80* 1.42*  CALCIUM 8.1* 8.0* 7.9*   LFT Recent Labs    06/12/21 0358  PROT 6.1*  ALBUMIN 2.1*  AST 1,007*  ALT 999*  ALKPHOS 195*  BILITOT 0.5   PT/INR Recent Labs    06/11/21 1110 06/12/21 0358  LABPROT 17.0* 16.6*  INR 1.4* 1.4*   Hepatitis Panel Recent Labs    06/10/21 0550  HEPBSAG NON REACTIVE  HCVAB NON REACTIVE  HEPAIGM NON REACTIVE  HEPBIGM NON REACTIVE    Impression: 51 year old lady with a history of substance abuse and diabetes admitted to the hospital with E. coli urosepsis.  Large bump in aminotransferases with significant decline all within 48 hours noted.  This pattern is not consistent with viral infection, biliary obstruction or autoimmune process.  Looks more like ischemia to me but we do not have any documented systemic hypotension.  Acute hepatotoxicity  from medication not excluded (cannot exclude illicit drug use-i.e. cocaine which could produce hepatic ischemia)  New anasarca on CT -may just be related to sepsis syndrome.  Doubt Budd-Chiari but need to assess hepatic blood flow to complete evaluation.  Relative plummeting of aminotransferases and stability of INR is reassuring; no evidence of fulminant liver failure at this time.   Recommendations:  We continue to avoid ibuprofen.  Encourage patient to avoid illicit substances in the future  Repeat hepatic function profile, INR tomorrow  Hepatic Doppler study tomorrow  Follow-up on pending serologies.  I discussed with Dr. Flossie Dibble earlier today.

## 2021-06-13 ENCOUNTER — Inpatient Hospital Stay (HOSPITAL_COMMUNITY): Payer: Self-pay

## 2021-06-13 LAB — COMPREHENSIVE METABOLIC PANEL
ALT: 640 U/L — ABNORMAL HIGH (ref 0–44)
AST: 279 U/L — ABNORMAL HIGH (ref 15–41)
Albumin: 2.1 g/dL — ABNORMAL LOW (ref 3.5–5.0)
Alkaline Phosphatase: 163 U/L — ABNORMAL HIGH (ref 38–126)
Anion gap: 6 (ref 5–15)
BUN: 23 mg/dL — ABNORMAL HIGH (ref 6–20)
CO2: 24 mmol/L (ref 22–32)
Calcium: 7.9 mg/dL — ABNORMAL LOW (ref 8.9–10.3)
Chloride: 107 mmol/L (ref 98–111)
Creatinine, Ser: 1.36 mg/dL — ABNORMAL HIGH (ref 0.44–1.00)
GFR, Estimated: 48 mL/min — ABNORMAL LOW (ref >60)
Glucose, Bld: 134 mg/dL — ABNORMAL HIGH (ref 70–99)
Potassium: 3.8 mmol/L (ref 3.5–5.1)
Sodium: 137 mmol/L (ref 135–145)
Total Bilirubin: 0.5 mg/dL (ref 0.3–1.2)
Total Protein: 6.1 g/dL — ABNORMAL LOW (ref 6.5–8.1)

## 2021-06-13 LAB — CULTURE, BLOOD (ROUTINE X 2)
Culture: NO GROWTH
Special Requests: ADEQUATE

## 2021-06-13 LAB — GLUCOSE, CAPILLARY: Glucose-Capillary: 176 mg/dL — ABNORMAL HIGH (ref 70–99)

## 2021-06-13 MED ORDER — METOPROLOL TARTRATE 100 MG PO TABS: 100.0000 mg | ORAL_TABLET | Freq: Two times a day (BID) | ORAL | 2 refills | Status: DC

## 2021-06-13 NOTE — Progress Notes (Signed)
Patient states she feels "great".  Wants to go home.  Denies abdominal pain Bedside Doppler in progress when I came to see this lady.   Preliminary report -patent portal vein.  Trace ascites.  Pleural effusion.  Vital signs in last 24 hours: Temp:  [97.6 F (36.4 C)-98.1 F (36.7 C)] 97.6 F (36.4 C) (05/29 0541) Pulse Rate:  [80-85] 80 (05/29 0541) Resp:  [20] 20 (05/29 0541) BP: (122-142)/(83-100) 142/100 (05/29 0541) SpO2:  [100 %] 100 % (05/29 0541) Weight:  [79.1 kg] 79.1 kg (05/29 0541) Last BM Date : 06/13/21 General:   Alert,  Well-developed, well-nourished, pleasant and cooperative in NAD  Intake/Output from previous day: 05/28 0701 - 05/29 0700 In: 880 [P.O.:880] Out: -  Intake/Output this shift: Total I/O In: 240 [P.O.:240] Out: -   Lab Results: Recent Labs    06/11/21 0444  WBC 10.8*  HGB 8.1*  HCT 24.8*  PLT 501*   BMET Recent Labs    06/11/21 0437 06/12/21 0358 06/13/21 0419  NA 133* 135 137  K 4.6 4.2 3.8  CL 105 108 107  CO2 21* 21* 24  GLUCOSE 102* 133* 134*  BUN 30* 26* 23*  CREATININE 1.80* 1.42* 1.36*  CALCIUM 8.0* 7.9* 7.9*   LFT Recent Labs    06/13/21 0419  PROT 6.1*  ALBUMIN 2.1*  AST 279*  ALT 640*  ALKPHOS 163*  BILITOT 0.5   PT/INR Recent Labs    06/11/21 1110 06/12/21 0358  LABPROT 17.0* 16.6*  INR 1.4* 1.4*    Impression:   51 year old lady with a history of substance abuse admitted to the hospital with E. coli urosepsis.  Large bump in aminotransferases with steady, precipitous normalization seen over the past 48 hours. There are a few entities which will produce such an enzyme pattern.  It continues to be most consistent with ischemia although drug/hepatotoxin not excluded. Less likely any type of viral hepatitis or biliary obstruction. Luminary Doppler - no Budd-Chiari with patent hepatic vein. Autoimmune markers, ceruloplasmin, repeat INR pending   Recommendations:  Would avoid ibuprofen and illicit  substances moving forward.  So long as repeat INR in line, patient could be discharged from a GI standpoint.  I would recommend repeat hepatic function profile on June 1.  I will offer GI follow-up in our office as an outpatient.

## 2021-06-13 NOTE — Discharge Summary (Signed)
Physician Discharge Summary   Patient: Megan Mullins MRN: [id number] DOB: Jan 13, 1971  Admit date:     06/08/2021  Discharge date: 06/13/21  Discharge Physician: Deatra James   PCP: Pcp, No   Recommendations at discharge:   Avoid Tylenol, statins, all hepatotoxins Avoid smoking, street drugs To follow-up with PCP within 1 week To follow-up with a gastroenterologist within 4-6 weeks Strict diabetic diet  Discharge Diagnoses: Principal Problem:   Acute liver failure Active Problems:   Sepsis ([place])   Acute pyelonephritis   Type 2 diabetes mellitus with hyperosmolar nonketotic hyperglycemia (HCC)   Multifocal atrial tachycardia (HCC)   Hypomagnesemia   Iron deficiency anemia   HTN (hypertension)   Substance abuse ([place])   AKI (acute kidney injury) ([place])  Resolved Problems:   * No resolved hospital problems. *  Hospital Course:   Megan Mullins is a 51 year old female with a history of hypertension, diabetes mellitus type II, presenting fever, abdominal pain, nausea, vomiting... Worsening symptoms over the past 5 hours.   ED:  Blood pressure (!) 168/111, pulse (!) 145, temperature (!) 103.3 F (39.6 C), resp. rate (!) 23, height $RemoveBe'5\' 1"'RQAjOXcCF$  (1.549 m), weight 68 kg, SpO2 99 %. CBC WBC 13.3, hemoglobin 10.4, 11.5, CMP: Creatinine 1.38, blood glucose level 170, UA moderate hemoglobin, positive nitrites, large leukocyte esterase,> 50 WBCs  EKG: V. tach heart rate of 150, QTc 446,  CT abdomen/pelvis: IMPRESSION: 1. Mild right-sided hydronephrosis with urothelial thickening and perinephric/periureteric stranding. No renal, ureteral or bladder calculi identified. Findings may represent sequela of a recently passed stone versus ascending urinary tract infection. Correlate with  urinalysis. 2. 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 3.  Aortic Atherosclerosis   Patient was given 2 L, of normal saline, Toradol, Zofran, Maalox, Tylenol and IV  Rocephin  Requested patient to be admitted for SIRS -ruling out sepsis, pyelonephritis  Assessment and Plan: * Acute liver failure - Patient has developed acute liver failure Unknown etiology possible sepsis   -Acute transaminitis: Elevated LFTs,    Latest Ref Rng & Units 06/13/2021    4:19 AM 06/12/2021    3:58 AM 06/11/2021    4:37 AM  Hepatic Function  Total Protein 6.5 - 8.1 g/dL 6.1   6.1   7.0    Albumin 3.5 - 5.0 g/dL 2.1   2.1   2.5    AST 15 - 41 U/L 279   1,007   3,834    ALT 0 - 44 U/L 640   999   1,527    Alk Phosphatase 38 - 126 U/L 163   195   219    Total Bilirubin 0.3 - 1.2 mg/dL 0.5   0.5   0.5      -Status post IV fluid resuscitation -Hepatitis panel all negative -Avoiding all hepatotoxins including Tylenol -Much improved LFTs -Appreciate GI input    Acute pyelonephritis - CT abdomen pelvis reviewed:  IMPRESSION: 1. Mild right-sided hydronephrosis with urothelial thickening and perinephric/periureteric stranding. No renal, ureteral or bladder calculi identified. Findings may represent sequela of a recently passed stone versus ascending urinary tract infection. Correlate with urinalysis. 2. 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 3.  Aortic Atherosclerosis  -Continue treatment per sepsis protocol IV fluids, IV antibiotics, Nausea note closely,  Sepsis ([place]) -Improved sepsis physiology with exception of elevated LFTs  WBC: 13.3, 19.8, 14.8 >> 10.8   -Lactic acid 1.1, 0.2, 1.9   - On arrival was  meeting SIRS criteria, BP(!) 168/111,  pulse (!) 145, TMAX  (!) 103.3 F (39.6 C), resp. rate (!) 23, height 5' 1"  (1.549 m), weight 68 kg, SpO2 99 %.  -Likely source UTI, pyelonephritis -Urine culture growing E. coli, ESBL,  Biotics switched from Rocephin to meropenem Blood cultures: 1/2  growing bacillus species -Repeat blood cultures ordered today 06/10/2021  Status post aggressive IV fluid resuscitation with LR continue  maintenance   -CT abdomen pelvis reviewed: Revealing mild right-sided hydronephrosis    Iron deficiency anemia - Monitoring hemoglobin  -Hemoglobin 9.0 >>> 7.4 >> 8.1 -As of bleeding status post aggressive IV fluid resuscitation, likely delusional --Iron studies within normal limits total iron 105, TIBC 238, folate 18, B12 841  Hypomagnesemia - Status post IV  2 g of magnesium  Multifocal atrial tachycardia ([place]) -Improved -On arrival EKG reading heart rate 150, PR 66, QRS duration 88, QTc 446, Sinus ventricular tachycardia with PVCs -Due to SIRS/sepsis -TSH 0.701 within normal limits -Status post IV fluid resuscitation, electrolyte repletion - increased from 50 to 100 mg p.o. twice daily  Type 2 diabetes mellitus with hyperosmolar nonketotic hyperglycemia (HCC) - Home medication reviewed, long-acting insulin 10 units nightly, metformin 1 g twice daily -Resuming home regimen    Transaminitis-resolved as of 06/11/2021 - Acute elevation of alk phos 140 LFTs, AST 178, ALT 102,  -Likely due to sepsis, -Continue IV fluid rehydration -Hepatitis panel  HTN (hypertension) -Improved - Hypertensive..  On admission blood pressure as high as 198/121, currently 168/111 -Holding home medication losartan -AKI, sepsis -Increased her metoprolol from 50 to 100 mg p.o. twice daily -Continue as needed hydralazine   AKI (acute kidney injury) (HCC) - BUN 12, creatinine 1.38 >>> 1.77 >> 1.65 >> 1.80 >> 1.42, 1.36 BUN 30, GFR 34, -Mild AKI likely due to hypotension-SIRS/sepsis We will continue IV fluid hydration, avoiding nephrotoxins  Substance abuse ([place]) - Admitted to using cocaine 3 days prior to day of admission -Was advised to abstain from any drug use and abuse, -Monitoring for signs symptoms of withdrawal     Consultants: Gastroenterologist Procedures performed: none  Disposition: Home Diet recommendation:  Discharge Diet Orders (From admission, onward)     Start      Ordered   06/13/21 0000  Diet Carb Modified        06/13/21 1018           Cardiac and Carb modified diet DISCHARGE MEDICATION: Allergies as of 06/13/2021   No Known Allergies      Medication List     STOP taking these medications    losartan 25 MG tablet Commonly known as: COZAAR       TAKE these medications    Lantus SoloStar 100 UNIT/ML Solostar Pen Generic drug: insulin glargine Inject 10 Units into the skin daily.   metFORMIN 1000 MG tablet Commonly known as: GLUCOPHAGE Take 1 tablet (1,000 mg total) by mouth 2 (two) times daily with a meal.   metoprolol tartrate 100 MG tablet Commonly known as: LOPRESSOR Take 1 tablet (100 mg total) by mouth 2 (two) times daily. What changed:  medication strength how much to take        Discharge Exam: Filed Weights   06/11/21 0500 06/12/21 0400 06/13/21 0541  Weight: 80.3 kg 80.3 kg 79.1 kg      Physical Exam:   General:  AAO x 3,  cooperative, no distress;   HEENT:  Normocephalic, PERRL, otherwise with in Normal limits   Neuro:  CNII-XII intact. , normal motor and sensation,  reflexes intact   Lungs:   Clear to auscultation BL, Respirations unlabored,  No wheezes / crackles  Cardio:    S1/S2, RRR, No murmure, No Rubs or Gallops   Abdomen:  Soft, non-tender, bowel sounds active all four quadrants, no guarding or peritoneal signs.  Muscular  skeletal:  Limited exam -global generalized weaknesses - in bed, able to move all 4 extremities,   2+ pulses,  symmetric, No pitting edema  Skin:  Dry, warm to touch, negative for any Rashes,  Wounds: Please see nursing documentation          Condition at discharge: good  The results of significant diagnostics from this hospitalization (including imaging, microbiology, ancillary and laboratory) are listed below for reference.   Imaging Studies: CT ABDOMEN PELVIS WO CONTRAST  Result Date: 06/11/2021 CLINICAL DATA:  Increased upper abdominal pain and  tenderness this morning. Admitted for acute liver failure, sepsis and pyelonephritis. Clinical concern for hepatomegaly. EXAM: CT ABDOMEN AND PELVIS WITHOUT CONTRAST TECHNIQUE: Multidetector CT imaging of the abdomen and pelvis was performed following the standard protocol without IV contrast. RADIATION DOSE REDUCTION: This exam was performed according to the departmental dose-optimization program which includes automated exposure control, adjustment of the mA and/or kV according to patient size and/or use of iterative reconstruction technique. COMPARISON:  06/08/2021 FINDINGS: Lower chest: Interval small right pleural effusion and mild right basilar dependent ground-glass opacity and linear density. Mildly enlarged are without gross change. Clear left lung base. Hepatobiliary: Stable prominence of the lateral segment of the left lobe of the liver and caudate lobe and normal-sized right lobe. A small cyst in the dome of the liver on the right was better visualized on the previous examination. Interval pericholecystic fluid and small amount of perihepatic fluid and soft tissue stranding. No gallbladder wall thickening or gallstones visualized. Pancreas: Unremarkable. No pancreatic ductal dilatation or surrounding inflammatory changes. Spleen: Normal in size without focal abnormality. Adrenals/Urinary Tract: The left adrenal gland remains diffusely prominent. Normal to slightly thickened right adrenal gland. No adrenal nodules are seen. The previously seen dilatation of the right renal collecting system is no longer demonstrated. Mild increase in bilateral perinephric soft tissue stranding and right paranephric fluid. No bladder or ureteral calculi and no hydronephrosis on either side. Stomach/Bowel: Small number of sigmoid colon diverticula without evidence of diverticulitis. Unremarkable stomach, small bowel and appendix. Vascular/Lymphatic: Mild atheromatous arterial calcifications. No enlarged lymph nodes.  Reproductive: Uterus and bilateral adnexa are unremarkable. Other: Interval small amount of free peritoneal fluid constant true predominantly in the pelvis. Interval diffuse subcutaneous edema. Musculoskeletal: Lumbar and lower thoracic spine degenerative changes. Previously noted bilateral hip degenerative changes, greater on the left. IMPRESSION: 1. Interval diffuse anasarca with a small right pleural effusion, small amount of ascites and diffuse subcutaneous edema. 2. Resolved right hydronephrosis. 3. Minimal pulmonary edema or dependent atelectasis in the right lower lobe. Electronically Signed   By: Claudie Revering M.D.   On: 06/11/2021 13:56   CT Abdomen Pelvis Wo Contrast  Result Date: 06/08/2021 CLINICAL DATA:  Abdominal pain with constipation since 01/30 today EXAM: CT ABDOMEN AND PELVIS WITHOUT CONTRAST TECHNIQUE: Multidetector CT imaging of the abdomen and pelvis was performed following the standard protocol without IV contrast. RADIATION DOSE REDUCTION: This exam was performed according to the departmental dose-optimization program which includes automated exposure control, adjustment of the mA and/or kV according to patient size and/or use of iterative reconstruction technique. COMPARISON:  None Available. FINDINGS: Lower chest: No acute abnormality. Hepatobiliary: Unremarkable noncontrast  appearance of the hepatic parenchyma. Gallbladder is unremarkable. No biliary ductal dilation. Pancreas: No pancreatic ductal dilation or evidence of acute inflammation. Spleen: No splenomegaly or focal splenic lesion. Adrenals/Urinary Tract: Bilateral adrenal glands are within normal limits. Mild right-sided hydronephrosis with urothelial thickening and perinephric/periureteric stranding. Mild left perinephric stranding. No renal, ureteral or bladder calculi identified. Urinary bladder is unremarkable for degree of distension. Stomach/Bowel: Radiopaque enteric contrast material traverses distal loops of small bowel.  Stomach is unremarkable for degree of distension. No pathologic dilation of small or large bowel. The appendix and terminal ileum appear normal. No evidence of acute bowel inflammation. Vascular/Lymphatic: Aortic and branch vessel atherosclerosis without abdominal aortic aneurysm. No pathologically enlarged abdominal or pelvic lymph nodes. Reproductive: The uterus and adnexa are unremarkable in noncontrast CT appearance for a premenopausal female. Other: Trace pelvic free fluid is within physiologic normal limits. Musculoskeletal: Mild thoracolumbar spondylosis. 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 IMPRESSION: 1. Mild right-sided hydronephrosis with urothelial thickening and perinephric/periureteric stranding. No renal, ureteral or bladder calculi identified. Findings may represent sequela of a recently passed stone versus ascending urinary tract infection. Correlate with urinalysis. 2. 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 3.  Aortic Atherosclerosis (ICD10-I70.0). Electronically Signed   By: Dahlia Bailiff M.D.   On: 06/08/2021 12:08    Microbiology: Results for orders placed or performed during the hospital encounter of 06/08/21  Urine Culture     Status: Abnormal   Collection Time: 06/08/21  9:03 AM   Specimen: Urine, Clean Catch  Result Value Ref Range Status   Specimen Description   Final    URINE, CLEAN CATCH Performed at [facility], [address] [place], [place] [address]    Special Requests   Final    NONE Performed at [facility], [address] [place], [place] [address]    Culture (A)  Final    >=100,000 COLONIES/mL ESCHERICHIA COLI Confirmed Extended Spectrum Beta-Lactamase Producer (ESBL).  In bloodstream infections from ESBL organisms, carbapenems are preferred over piperacillin/tazobactam. They are shown to have a lower risk of mortality.    Report Status 06/10/2021 FINAL  Final   Organism ID, Bacteria ESCHERICHIA COLI (A)   Final      Susceptibility   Escherichia coli - MIC*    AMPICILLIN >=32 RESISTANT Resistant     CEFAZOLIN >=64 RESISTANT Resistant     CEFEPIME 0.5 SENSITIVE Sensitive     CEFTRIAXONE 32 RESISTANT Resistant     CIPROFLOXACIN >=4 RESISTANT Resistant     GENTAMICIN <=1 SENSITIVE Sensitive     IMIPENEM <=0.25 SENSITIVE Sensitive     NITROFURANTOIN <=16 SENSITIVE Sensitive     TRIMETH/SULFA >=320 RESISTANT Resistant     AMPICILLIN/SULBACTAM 4 SENSITIVE Sensitive     PIP/TAZO <=4 SENSITIVE Sensitive     * >=100,000 COLONIES/mL ESCHERICHIA COLI  Culture, blood (routine x 2)     Status: None   Collection Time: 06/08/21  1:58 PM   Specimen: BLOOD LEFT FOREARM  Result Value Ref Range Status   Specimen Description BLOOD LEFT FOREARM  Final   Special Requests   Final    BOTTLES DRAWN AEROBIC ONLY Blood Culture adequate volume   Culture   Final    NO GROWTH 5 DAYS Performed at [facility], [address] [place], [place] [address]    Report Status 06/13/2021 FINAL  Final  Culture, blood (routine x 2)     Status: Abnormal   Collection Time: 06/08/21  2:05 PM   Specimen: BLOOD  RIGHT HAND  Result Value Ref Range Status   Specimen Description   Final    BLOOD RIGHT HAND Performed at [facility], [address], [place], [place] [address]    Special Requests   Final    BOTTLES DRAWN AEROBIC AND ANAEROBIC Blood Culture adequate volume Performed at [facility], [address] [place], [place] [address]    Culture  Setup Time   Final    GRAM POSITIVE RODS ANAEROBIC BOTTLE Gram Stain Report Called to,Read Back By and Verified With: [email or web address]  BY MATTHEWS, B 5.25.2023 Performed at [facility], [address], [place], [place] [address]    Culture (A)  Final    BACILLUS SPECIES Standardized susceptibility testing for this organism is not available. Performed at [facility], [place] [address], [place], [place] [address]    Report Status 06/11/2021 FINAL  Final  MRSA Next Gen by PCR,  Nasal     Status: None   Collection Time: 06/08/21  3:15 PM   Specimen: Nasal Mucosa; Nasal Swab  Result Value Ref Range Status   MRSA by PCR Next Gen NOT DETECTED NOT DETECTED Final    Comment: (NOTE) The GeneXpert MRSA Assay (FDA approved for NASAL specimens only), is one component of a comprehensive MRSA colonization surveillance program. It is not intended to diagnose MRSA infection nor to guide or monitor treatment for MRSA infections. Test performance is not FDA approved in patients less than 11 years old. Performed at [facility], [address], [place], [place] [address]   Culture, blood (Routine X 2) w Reflex to ID Panel     Status: None (Preliminary result)   Collection Time: 06/10/21  1:03 PM   Specimen: Left Antecubital; Blood  Result Value Ref Range Status   Specimen Description   Final    LEFT ANTECUBITAL BOTTLES DRAWN AEROBIC AND ANAEROBIC   Special Requests Blood Culture adequate volume  Final   Culture   Final    NO GROWTH 3 DAYS Performed at [facility], [address] [place], [place] [address]    Report Status PENDING  Incomplete  Culture, blood (Routine X 2) w Reflex to ID Panel     Status: None (Preliminary result)   Collection Time: 06/10/21  1:03 PM   Specimen: BLOOD LEFT HAND  Result Value Ref Range Status   Specimen Description   Final    BLOOD LEFT HAND BOTTLES DRAWN AEROBIC AND ANAEROBIC   Special Requests Blood Culture adequate volume  Final   Culture   Final    NO GROWTH 3 DAYS Performed at [facility], [address] [place],  [address]    Report Status PENDING  Incomplete    Labs: CBC: Recent Labs  Lab 06/08/21 0900 06/09/21 0355 06/10/21 0432 06/11/21 0444  WBC 13.3* 19.8* 14.8* 10.8*  NEUTROABS 11.5*  --   --   --   HGB 10.4* 9.0* 7.4* 8.1*  HCT 32.8* 29.2* 24.2* 24.8*  MCV 95.6 97.7 96.4 93.2  PLT 746* 554* 462* 734*   Basic Metabolic Panel: Recent Labs  Lab 06/08/21 1408 06/09/21 0355 06/10/21 0432 06/11/21 0437  06/12/21 0358 06/13/21 0419  NA  --  136 137 133* 135 137  K  --  4.8 4.4 4.6 4.2 3.8  CL  --  108 108 105 108 107  CO2  --  22 21* 21* 21* 24  GLUCOSE  --  187* 124* 102* 133* 134*  BUN  --  17 21* 30*  26* 23*  CREATININE  --  1.71* 1.65* 1.80* 1.42* 1.36*  CALCIUM  --  8.0* 8.1* 8.0* 7.9* 7.9*  MG 1.5*  --   --   --   --   --    Liver Function Tests: Recent Labs  Lab 06/09/21 0355 06/10/21 0432 06/11/21 0437 06/12/21 0358 06/13/21 0419  AST 18 178* 3,834* 1,007* 279*  ALT 13 102* 1,527* 999* 640*  ALKPHOS 108 140* 219* 195* 163*  BILITOT 0.3 0.2* 0.5 0.5 0.5  PROT 6.8 6.4* 7.0 6.1* 6.1*  ALBUMIN 2.4* 2.2* 2.5* 2.1* 2.1*   CBG: Recent Labs  Lab 06/11/21 2047 06/12/21 0732 06/12/21 1120 06/12/21 1725 06/13/21 0731  GLUCAP 136* 123* 169* 125* 176*    Discharge time spent: greater than 30 minutes.  Signed: Deatra James, MD [facility] 06/13/2021

## 2021-06-15 ENCOUNTER — Other Ambulatory Visit: Payer: Self-pay

## 2021-06-15 ENCOUNTER — Telehealth: Payer: Self-pay

## 2021-06-15 DIAGNOSIS — K7200 Acute and subacute hepatic failure without coma: Secondary | ICD-10-CM

## 2021-06-15 LAB — CULTURE, BLOOD (ROUTINE X 2)
Culture: NO GROWTH
Culture: NO GROWTH
Special Requests: ADEQUATE
Special Requests: ADEQUATE

## 2021-06-15 LAB — ANTI-SMOOTH MUSCLE ANTIBODY, IGG: F-Actin IgG: 11 Units (ref 0–19)

## 2021-06-15 NOTE — Telephone Encounter (Signed)
Lab has been ordered, pt is aware and verbalized understanding.

## 2021-06-15 NOTE — Telephone Encounter (Signed)
-----   Message from Corbin Ade, MD sent at 06/15/2021  2:38 PM EDT ----- This lady needs a hepatic function profile done in 1 week from now.

## 2021-06-16 LAB — IMMUNOGLOBULINS A/E/G/M, SERUM
IgA: 283 mg/dL (ref 87–352)
IgE (Immunoglobulin E), Serum: 589 IU/mL — ABNORMAL HIGH (ref 6–495)
IgG (Immunoglobin G), Serum: 1697 mg/dL — ABNORMAL HIGH (ref 586–1602)
IgM (Immunoglobulin M), Srm: 59 mg/dL (ref 26–217)

## 2021-06-28 ENCOUNTER — Other Ambulatory Visit: Payer: Self-pay | Admitting: Physician Assistant

## 2021-06-28 DIAGNOSIS — E1165 Type 2 diabetes mellitus with hyperglycemia: Secondary | ICD-10-CM

## 2021-06-28 DIAGNOSIS — I10 Essential (primary) hypertension: Secondary | ICD-10-CM

## 2021-07-04 ENCOUNTER — Other Ambulatory Visit (HOSPITAL_COMMUNITY)
Admission: RE | Admit: 2021-07-04 | Discharge: 2021-07-04 | Disposition: A | Discharge status: Home / Self Care | Payer: Self-pay | Source: Ambulatory Visit | Attending: Physician Assistant | Admitting: Physician Assistant

## 2021-07-04 DIAGNOSIS — K7200 Acute and subacute hepatic failure without coma: Secondary | ICD-10-CM

## 2021-07-04 DIAGNOSIS — E1165 Type 2 diabetes mellitus with hyperglycemia: Secondary | ICD-10-CM | POA: Insufficient documentation

## 2021-07-04 DIAGNOSIS — I10 Essential (primary) hypertension: Secondary | ICD-10-CM | POA: Insufficient documentation

## 2021-07-04 LAB — LIPID PANEL
Cholesterol: 184 mg/dL (ref 0–200)
HDL: 70 mg/dL
LDL Cholesterol: 96 mg/dL (ref 0–99)
Total CHOL/HDL Ratio: 2.6 ratio
Triglycerides: 88 mg/dL
VLDL: 18 mg/dL (ref 0–40)

## 2021-07-04 LAB — COMPREHENSIVE METABOLIC PANEL
ALT: 18 U/L (ref 0–44)
AST: 24 U/L (ref 15–41)
Albumin: 3.4 g/dL — ABNORMAL LOW (ref 3.5–5.0)
Alkaline Phosphatase: 104 U/L (ref 38–126)
Anion gap: 7 (ref 5–15)
BUN: 22 mg/dL — ABNORMAL HIGH (ref 6–20)
CO2: 24 mmol/L (ref 22–32)
Calcium: 9 mg/dL (ref 8.9–10.3)
Chloride: 108 mmol/L (ref 98–111)
Creatinine, Ser: 1.23 mg/dL — ABNORMAL HIGH (ref 0.44–1.00)
GFR, Estimated: 54 mL/min — ABNORMAL LOW (ref >60)
Glucose, Bld: 107 mg/dL — ABNORMAL HIGH (ref 70–99)
Potassium: 4 mmol/L (ref 3.5–5.1)
Sodium: 139 mmol/L (ref 135–145)
Total Bilirubin: 0.5 mg/dL (ref 0.3–1.2)
Total Protein: 8 g/dL (ref 6.5–8.1)

## 2021-07-05 ENCOUNTER — Ambulatory Visit: Payer: Self-pay | Admitting: Physician Assistant

## 2021-07-05 LAB — MICROALBUMIN, URINE: Microalb, Ur: 558 ug/mL — ABNORMAL HIGH

## 2021-07-06 NOTE — Progress Notes (Deleted)
GI Office Note    Referring Provider: No ref. provider found Primary Care Physician:  Pcp, No  Primary Gastroenterologist: Dr. Gala Romney  Chief Complaint   No chief complaint on file.    History of Present Illness   Megan Mullins is a 51 y.o. female presenting today at the request of No ref. provider found for ***hospital follow up of elevated transaminases.   Recently hospitalized 5/24 -5/29. She presented with fever, abdominal pain, nausea, vomiting and admitted with e. Coli pyelonephritis and gram negative bacteremia. Initially LFTs normal but then became elevated and peaked on 5/27 to AST 3834, ALT 1527, Alk Phos 219 with normal bilirubin. They began to downtrend and were AST 279, ALT 640, Alk Phos 163 on discharge. Of note her INR peaked at 1.4 and remained there on discharge. Acute hepatitis panel negative for Hep A, B, and C. IgG mildly elevated at 1697, IgE elevated to 589, normal IgA and IgM, ASMA negative. CT A/P on admission with hydronephrosis and right perinephric stranding, liver and biliary tree normal (gallbladder in situ). Repeat CT A/P with interval diffuse anasarca and small right pleural effusion, small amount of ascites and diffuse subcutaneous edema, resolved right hydronephrosis, minimal pulmonary edema. US Liver doppler with patent portal vein and trace perihepatic ascites, normal hepatic contour. It was likely felt that this was ischemic hepatitis vs drug/hepatotoxin although no significant hypotension episodes during hospitalization. She did admit to cocaine use as possible contributing factor.   Most recent labs with CMP on 07/04/21 with normalized LFTs.   Today:     Past Medical History:  Diagnosis Date   Diabetes mellitus without complication ([place])     Past Surgical History:  Procedure Laterality Date   TUBAL LIGATION      Current Outpatient Medications  Medication Sig Dispense Refill   insulin glargine (LANTUS SOLOSTAR) 100 UNIT/ML Solostar Pen  Inject 10 Units into the skin daily. 15 mL PRN   metFORMIN (GLUCOPHAGE) 1000 MG tablet Take 1 tablet (1,000 mg total) by mouth 2 (two) times daily with a meal. 60 tablet 1   metoprolol tartrate (LOPRESSOR) 100 MG tablet Take 1 tablet (100 mg total) by mouth 2 (two) times daily. 60 tablet 2   No current facility-administered medications for this visit.    Allergies as of 07/07/2021   (No Known Allergies)    Family History  Problem Relation Age of Onset   Heart disease Mother    Diabetes Mother     Social History   Socioeconomic History   Marital status: Single    Spouse name: Not on file   Number of children: Not on file   Years of education: Not on file   Highest education level: Not on file  Occupational History   Not on file  Tobacco Use   Smoking status: Every Day    Packs/day: 0.50    Types: Cigarettes   Smokeless tobacco: Never  Vaping Use   Vaping Use: Never used  Substance and Sexual Activity   Alcohol use: Not Currently    Comment: last use Feb 2023   Drug use: Not Currently    Types: Marijuana, Cocaine    Comment: last use Feb 2023   Sexual activity: Yes    Birth control/protection: Surgical  Other Topics Concern   Not on file  Social History Narrative   Not on file   Social Determinants of Health   Financial Resource Strain: Not on file  Food Insecurity: Not on file  Transportation Needs: Not on file  Physical Activity: Not on file  Stress: Not on file  Social Connections: Not on file  Intimate Partner Violence: Not on file     Review of Systems   Gen: Denies any fever, chills, fatigue, weight loss, lack of appetite.  CV: Denies chest pain, heart palpitations, peripheral edema, syncope.  Resp: Denies shortness of breath at rest or with exertion. Denies wheezing or cough.  GI: see HPI GU : Denies urinary burning, urinary frequency, urinary hesitancy MS: Denies joint pain, muscle weakness, cramps, or limitation of movement.  Derm: Denies  rash, itching, dry skin Psych: Denies depression, anxiety, memory loss, and confusion Heme: Denies bruising, bleeding, and enlarged lymph nodes.   Physical Exam   There were no vitals taken for this visit.  General:   Alert and oriented. Pleasant and cooperative. Well-nourished and well-developed.  Head:  Normocephalic and atraumatic. Eyes:  Without icterus, sclera clear and conjunctiva pink.  Ears:  Normal auditory acuity. Mouth:  No deformity or lesions, oral mucosa pink.  Lungs:  Clear to auscultation bilaterally. No wheezes, rales, or rhonchi. No distress.  Heart:  S1, S2 present without murmurs appreciated.  Abdomen:  +BS, soft, non-tender and non-distended. No HSM noted. No guarding or rebound. No masses appreciated.  Rectal:  Deferred  Msk:  Symmetrical without gross deformities. Normal posture. Extremities:  Without edema. Neurologic:  Alert and  oriented x4;  grossly normal neurologically. Skin:  Intact without significant lesions or rashes. Psych:  Alert and cooperative. Normal mood and affect.   Assessment   Megan Mullins is a 51 y.o. female with a history of insulin dependent diabetes and substance abuse*** presenting today for hospital follow up of elevated LFTs.     PLAN   ***    Venetia Night, MSN, FNP-BC, AGACNP-BC [facility]

## 2021-07-07 ENCOUNTER — Ambulatory Visit: Payer: Self-pay | Admitting: Gastroenterology

## 2021-07-07 ENCOUNTER — Encounter: Payer: Self-pay | Admitting: Gastroenterology

## 2021-07-28 ENCOUNTER — Emergency Department (HOSPITAL_COMMUNITY)
Admission: EM | Admit: 2021-07-28 | Discharge: 2021-07-28 | Disposition: A | Discharge status: Home / Self Care | Payer: Self-pay | Attending: Emergency Medicine | Admitting: Emergency Medicine

## 2021-07-28 ENCOUNTER — Encounter (HOSPITAL_COMMUNITY): Payer: Self-pay | Admitting: Emergency Medicine

## 2021-07-28 ENCOUNTER — Other Ambulatory Visit: Payer: Self-pay

## 2021-07-28 ENCOUNTER — Emergency Department (HOSPITAL_COMMUNITY): Payer: Self-pay

## 2021-07-28 DIAGNOSIS — N3000 Acute cystitis without hematuria: Secondary | ICD-10-CM | POA: Insufficient documentation

## 2021-07-28 DIAGNOSIS — Z794 Long term (current) use of insulin: Secondary | ICD-10-CM | POA: Insufficient documentation

## 2021-07-28 DIAGNOSIS — R000 Tachycardia, unspecified: Secondary | ICD-10-CM | POA: Insufficient documentation

## 2021-07-28 DIAGNOSIS — Z79899 Other long term (current) drug therapy: Secondary | ICD-10-CM | POA: Insufficient documentation

## 2021-07-28 LAB — BLOOD GAS, VENOUS
Acid-base deficit: 3.8 mmol/L — ABNORMAL HIGH (ref 0.0–2.0)
Bicarbonate: 23.1 mmol/L (ref 20.0–28.0)
Drawn by: 6643
O2 Saturation: 68.4 %
Patient temperature: 36.7
pCO2, Ven: 47 mmHg (ref 44–60)
pH, Ven: 7.29 (ref 7.25–7.43)
pO2, Ven: 41 mmHg (ref 32–45)

## 2021-07-28 LAB — URINALYSIS, ROUTINE W REFLEX MICROSCOPIC
Bilirubin Urine: NEGATIVE
Glucose, UA: NEGATIVE mg/dL
Ketones, ur: NEGATIVE mg/dL
Nitrite: NEGATIVE
Protein, ur: 100 mg/dL — AB
Specific Gravity, Urine: 1.004 — ABNORMAL LOW (ref 1.005–1.030)
WBC, UA: >50 WBC/hpf — ABNORMAL HIGH (ref 0–5)
pH: 5 (ref 5.0–8.0)

## 2021-07-28 LAB — CBC WITH DIFFERENTIAL/PLATELET
Abs Immature Granulocytes: 0.02 10*3/uL (ref 0.00–0.07)
Basophils Absolute: 0 10*3/uL (ref 0.0–0.1)
Basophils Relative: 1 %
Eosinophils Absolute: 0.1 10*3/uL (ref 0.0–0.5)
Eosinophils Relative: 2 %
HCT: 39.7 % (ref 36.0–46.0)
Hemoglobin: 12.9 g/dL (ref 12.0–15.0)
Immature Granulocytes: 0 %
Lymphocytes Relative: 36 %
Lymphs Abs: 2.2 10*3/uL (ref 0.7–4.0)
MCH: 30.4 pg (ref 26.0–34.0)
MCHC: 32.5 g/dL (ref 30.0–36.0)
MCV: 93.6 fL (ref 80.0–100.0)
Monocytes Absolute: 0.3 10*3/uL (ref 0.1–1.0)
Monocytes Relative: 4 %
Neutro Abs: 3.6 10*3/uL (ref 1.7–7.7)
Neutrophils Relative %: 57 %
Platelets: 399 10*3/uL (ref 150–400)
RBC: 4.24 MIL/uL (ref 3.87–5.11)
RDW: 15.7 % — ABNORMAL HIGH (ref 11.5–15.5)
WBC: 6.2 10*3/uL (ref 4.0–10.5)
nRBC: 0 % (ref 0.0–0.2)

## 2021-07-28 LAB — RAPID URINE DRUG SCREEN, HOSP PERFORMED
Amphetamines: NOT DETECTED
Barbiturates: NOT DETECTED
Benzodiazepines: NOT DETECTED
Cocaine: POSITIVE — AB
Opiates: NOT DETECTED
Tetrahydrocannabinol: NOT DETECTED

## 2021-07-28 LAB — COMPREHENSIVE METABOLIC PANEL
ALT: 12 U/L (ref 0–44)
AST: 15 U/L (ref 15–41)
Albumin: 3.7 g/dL (ref 3.5–5.0)
Alkaline Phosphatase: 90 U/L (ref 38–126)
Anion gap: 5 (ref 5–15)
BUN: 22 mg/dL — ABNORMAL HIGH (ref 6–20)
CO2: 22 mmol/L (ref 22–32)
Calcium: 9.3 mg/dL (ref 8.9–10.3)
Chloride: 109 mmol/L (ref 98–111)
Creatinine, Ser: 1.39 mg/dL — ABNORMAL HIGH (ref 0.44–1.00)
GFR, Estimated: 47 mL/min — ABNORMAL LOW (ref >60)
Glucose, Bld: 152 mg/dL — ABNORMAL HIGH (ref 70–99)
Potassium: 3.7 mmol/L (ref 3.5–5.1)
Sodium: 136 mmol/L (ref 135–145)
Total Bilirubin: 0.3 mg/dL (ref 0.3–1.2)
Total Protein: 8.8 g/dL — ABNORMAL HIGH (ref 6.5–8.1)

## 2021-07-28 LAB — LIPASE, BLOOD: Lipase: 28 U/L (ref 11–51)

## 2021-07-28 MED ORDER — SODIUM CHLORIDE 0.9 % IV BOLUS
1000.0000 mL | Freq: Once | INTRAVENOUS | Status: AC
  Administered 2021-07-28: 1000 mL via INTRAVENOUS

## 2021-07-28 MED ORDER — LACTATED RINGERS IV BOLUS
1000.0000 mL | Freq: Once | INTRAVENOUS | Status: AC
  Administered 2021-07-28: 1000 mL via INTRAVENOUS

## 2021-07-28 MED ORDER — CEPHALEXIN 500 MG PO CAPS: 500.0000 mg | ORAL_CAPSULE | Freq: Four times a day (QID) | ORAL | 0 refills | Status: DC

## 2021-07-28 MED ORDER — SODIUM CHLORIDE 0.9 % IV SOLN
2.0000 g | Freq: Once | INTRAVENOUS | Status: AC
  Administered 2021-07-28: 2 g via INTRAVENOUS
  Filled 2021-07-28: qty 20

## 2021-07-28 MED ORDER — ONDANSETRON HCL 4 MG/2ML IJ SOLN
4.0000 mg | Freq: Once | INTRAMUSCULAR | Status: AC
  Administered 2021-07-28: 4 mg via INTRAVENOUS
  Filled 2021-07-28: qty 2

## 2021-07-28 MED ORDER — METOPROLOL TARTRATE 50 MG PO TABS
100.0000 mg | ORAL_TABLET | Freq: Once | ORAL | Status: AC
  Administered 2021-07-28: 100 mg via ORAL
  Filled 2021-07-28: qty 2

## 2021-07-28 MED ORDER — FENTANYL CITRATE PF 50 MCG/ML IJ SOSY
50.0000 ug | PREFILLED_SYRINGE | Freq: Once | INTRAMUSCULAR | Status: AC
  Administered 2021-07-28: 50 ug via INTRAVENOUS
  Filled 2021-07-28: qty 1

## 2021-07-28 MED ORDER — IOHEXOL 300 MG/ML  SOLN
100.0000 mL | Freq: Once | INTRAMUSCULAR | Status: AC | PRN
Start: 2021-07-28 — End: 2021-07-28
  Administered 2021-07-28: 100 mL via INTRAVENOUS

## 2021-07-28 NOTE — ED Provider Notes (Signed)
[facility] Provider Note   CSN: [id number] Arrival date & time: 07/28/21  0053     History  Chief Complaint  Patient presents with   Abdominal Pain    LLQ   Tachycardia    Megan Mullins is a 51 y.o. female.  51 year old female who presents ER today with left lower quadrant abdominal pain.  Patient states that this started earlier tonight.  She had some similar symptoms back in May which turned out to be UTI she admitted to the hospital for.  She denies any fevers, nausea, vomiting, diarrhea or constipation.  It appears on chart review the patient has a long history of cocaine use does not supplied information to me at this time.  Denies any headache.  Denies any sick contacts.        Home Medications Prior to Admission medications   Medication Sig Start Date End Date Taking? Authorizing Provider  cephALEXin (KEFLEX) 500 MG capsule Take 1 capsule (500 mg total) by mouth 4 (four) times daily. 07/28/21  Yes Ameah Chanda, Corene Cornea, MD  insulin glargine (LANTUS SOLOSTAR) 100 UNIT/ML Solostar Pen Inject 10 Units into the skin daily. 03/08/21   Soyla Dryer, PA-C  metFORMIN (GLUCOPHAGE) 1000 MG tablet Take 1 tablet (1,000 mg total) by mouth 2 (two) times daily with a meal. 02/23/21   Dessa Phi, DO  metoprolol tartrate (LOPRESSOR) 100 MG tablet Take 1 tablet (100 mg total) by mouth 2 (two) times daily. 06/13/21 09/11/21  Deatra James, MD      Allergies    Patient has no known allergies.    Review of Systems   Review of Systems  Physical Exam Updated Vital Signs BP (!) 163/119   Pulse (!) 117   Temp 98.1 F (36.7 C)   Resp (!) 28   Ht 5\' 1"  (1.549 m)   Wt 72.6 kg   LMP 07/18/2021 (Approximate)   SpO2 92%   BMI 30.23 kg/m  Physical Exam Vitals and nursing note reviewed.  Constitutional:      Appearance: She is well-developed.  HENT:     Head: Normocephalic and atraumatic.     Mouth/Throat:     Mouth: Mucous membranes are dry.  Eyes:      Pupils: Pupils are equal, round, and reactive to light.  Cardiovascular:     Rate and Rhythm: Regular rhythm. Tachycardia present.  Pulmonary:     Effort: No respiratory distress.     Breath sounds: No stridor.  Abdominal:     General: Abdomen is flat. There is no distension.     Tenderness: There is abdominal tenderness (llq).  Musculoskeletal:        General: No swelling or tenderness. Normal range of motion.     Cervical back: Normal range of motion.  Skin:    General: Skin is warm and dry.  Neurological:     General: No focal deficit present.     Mental Status: She is alert.     ED Results / Procedures / Treatments   Labs (all labs ordered are listed, but only abnormal results are displayed) Labs Reviewed  URINE CULTURE - Abnormal; Notable for the following components:      Result Value   Culture   (*)    Value: >=100,000 COLONIES/mL ESCHERICHIA COLI Confirmed Extended Spectrum Beta-Lactamase Producer (ESBL).  In bloodstream infections from ESBL organisms, carbapenems are preferred over piperacillin/tazobactam. They are shown to have a lower risk of mortality.    Organism ID, Bacteria  ESCHERICHIA COLI (*)    All other components within normal limits  CBC WITH DIFFERENTIAL/PLATELET - Abnormal; Notable for the following components:   RDW 15.7 (*)    All other components within normal limits  COMPREHENSIVE METABOLIC PANEL - Abnormal; Notable for the following components:   Glucose, Bld 152 (*)    BUN 22 (*)    Creatinine, Ser 1.39 (*)    Total Protein 8.8 (*)    GFR, Estimated 47 (*)    All other components within normal limits  BLOOD GAS, VENOUS - Abnormal; Notable for the following components:   Acid-base deficit 3.8 (*)    All other components within normal limits  URINALYSIS, ROUTINE W REFLEX MICROSCOPIC - Abnormal; Notable for the following components:   APPearance CLOUDY (*)    Specific Gravity, Urine 1.004 (*)    Hgb urine dipstick MODERATE (*)    Protein, ur  100 (*)    Leukocytes,Ua LARGE (*)    WBC, UA >50 (*)    Bacteria, UA MANY (*)    All other components within normal limits  RAPID URINE DRUG SCREEN, HOSP PERFORMED - Abnormal; Notable for the following components:   Cocaine POSITIVE (*)    All other components within normal limits  LIPASE, BLOOD    EKG EKG Interpretation  Date/Time:  Thursday July 28 2021 01:09:24 EDT Ventricular Rate:  115 PR Interval:  128 QRS Duration: 88 QT Interval:  354 QTC Calculation: 490 R Axis:   83 Text Interpretation: Sinus tachycardia Biatrial enlargement Borderline T wave abnormalities Borderline prolonged QT interval Confirmed by Blane Ohara [phone]) on 07/29/2021 2:08:44 PM  Radiology No results found.  Procedures  Procedures    Medications Ordered in ED Medications  lactated ringers bolus 1,000 mL (0 mLs Intravenous Stopped 07/28/21 0350)  fentaNYL (SUBLIMAZE) injection 50 mcg (50 mcg Intravenous Given 07/28/21 0205)  ondansetron (ZOFRAN) injection 4 mg (4 mg Intravenous Given 07/28/21 0204)  iohexol (OMNIPAQUE) 300 MG/ML solution 100 mL (100 mLs Intravenous Contrast Given 07/28/21 0239)  sodium chloride 0.9 % bolus 1,000 mL (0 mLs Intravenous Stopped 07/28/21 0542)  cefTRIAXone (ROCEPHIN) 2 g in sodium chloride 0.9 % 100 mL IVPB (0 g Intravenous Stopped 07/28/21 0542)  metoprolol tartrate (LOPRESSOR) tablet 100 mg (100 mg Oral Given 07/28/21 0547)    ED Course/ Medical Decision Making/ A&P                           Medical Decision Making Amount and/or Complexity of Data Reviewed Labs: ordered. Radiology: ordered.  Risk Prescription drug management.   We will initiate work-up to evaluate for diverticulitis, pyelonephritis, kidney stone or other GI pathology.  We will treat symptomatically for the time being.  May be acutely intoxicated with sympathomimetic explaining her tachypnea tachycardia and hypertension.  We will also check for DKA.  No e/o DKA. VS improved. Patient endoreses  cocaine use which I suspect is at least partially related to her abnormal vitals. Improved with home meds which she hasn't taken recently. Feels better. Stable for discharge.    Final Clinical Impression(s) / ED Diagnoses Final diagnoses:  Acute cystitis without hematuria    Rx / DC Orders ED Discharge Orders          Ordered    cephALEXin (KEFLEX) 500 MG capsule  4 times daily        07/28/21 0701              Jaevon Paras,  Barbara Cower, MD 08/02/21 [phone]

## 2021-07-28 NOTE — ED Triage Notes (Addendum)
Pt to ed via [facility]. C/o LLQ abdominal pain that intensified this evening that woke pt up out of their sleep. Pt denies n/v/d/f

## 2021-07-30 LAB — URINE CULTURE: Culture: >=100000 — AB

## 2021-07-31 ENCOUNTER — Telehealth: Payer: Self-pay

## 2021-07-31 NOTE — Progress Notes (Signed)
ED Antimicrobial Stewardship Positive Culture Follow Up   Megan Mullins is an 51 y.o. female who presented to [facility] on 07/28/2021 with a chief complaint of  Chief Complaint  Patient presents with   Abdominal Pain    LLQ   Tachycardia    Recent Results (from the past 720 hour(s))  Urine Culture     Status: Abnormal   Collection Time: 07/28/21  2:00 AM   Specimen: Urine, Clean Catch  Result Value Ref Range Status   Specimen Description   Final    URINE, CLEAN CATCH Performed at [facility], [address], [place], [place] [address]    Special Requests   Final    NONE Performed at [facility], [address] [place], [place] [address]    Culture (A)  Final    >=100,000 COLONIES/mL ESCHERICHIA COLI Confirmed Extended Spectrum Beta-Lactamase Producer (ESBL).  In bloodstream infections from ESBL organisms, carbapenems are preferred over piperacillin/tazobactam. They are shown to have a lower risk of mortality.    Report Status 07/30/2021 FINAL  Final   Organism ID, Bacteria ESCHERICHIA COLI (A)  Final      Susceptibility   Escherichia coli - MIC*    AMPICILLIN >=32 RESISTANT Resistant     CEFAZOLIN >=64 RESISTANT Resistant     CEFEPIME 0.5 SENSITIVE Sensitive     CEFTRIAXONE 4 RESISTANT Resistant     CIPROFLOXACIN >=4 RESISTANT Resistant     GENTAMICIN <=1 SENSITIVE Sensitive     IMIPENEM <=0.25 SENSITIVE Sensitive     NITROFURANTOIN <=16 SENSITIVE Sensitive     TRIMETH/SULFA >=320 RESISTANT Resistant     AMPICILLIN/SULBACTAM 4 SENSITIVE Sensitive     PIP/TAZO <=4 SENSITIVE Sensitive     * >=100,000 COLONIES/mL ESCHERICHIA COLI    [x]  Original note not signed from ED so we need to see if patient is tolerating PO / vomiting.  If tolerating PO and not vomiting, STOP keflex and start macrobid below. IF not tolerating PO and vomiting call me at [phone], and I will discuss with EDP to see if needs to return to ED.  Macrobid 100mg  BID x 5 days (Qty 10; Refills 0)  ED  Provider: [phone], PharmD, BCPS 07/31/2021 12:02 PM ED Clinical Pharmacist -  [phone]

## 2021-07-31 NOTE — Telephone Encounter (Signed)
Post ED Visit - Positive Culture Follow-up: Successful Patient Follow-Up  Culture assessed and recommendations reviewed by:  [x]  , Pharm.D. []  Delmar Landau, Pharm.D., BCPS AQ-ID []  , Pharm.D., BCPS []  Celedonio Miyamoto, Pharm.D., BCPS []  [place], Garvin Fila.D., BCPS, AAHIVP []  , Pharm.D., BCPS, AAHIVP []  Georgina Pillion, PharmD, BCPS []  , PharmD, BCPS []  [place], PharmD, BCPS []  [address], PharmD  Positive urine culture  []  Patient discharged without antimicrobial prescription and treatment is now indicated [x]  Organism is resistant to prescribed ED discharge antimicrobial []  Patient with positive blood cultures  Plan: call pt, it tolerating po's call in Macrobid 100 mg po BID x 5 days.  Pt states she is tolerating food/liquids fine, no nausea or vomiting.   Changes discussed with ED provider: , DO New antibiotic prescription Macrobid 100 mg po BID x 5 days Called to [facility]  Contacted patient, date 07/31/2021, time 5:40pm   Lysle Pearl 07/31/2021, 5:47 PM

## 2021-08-04 ENCOUNTER — Other Ambulatory Visit: Payer: Self-pay

## 2021-08-04 ENCOUNTER — Telehealth: Payer: Self-pay

## 2021-08-04 NOTE — Telephone Encounter (Signed)
Attempted call for follow up regarding recent ER visit to [facility]. No answer, left Voicemail requesting return call.   Francee Nodal RN [employer]

## 2021-12-28 ENCOUNTER — Telehealth: Payer: Self-pay

## 2021-12-28 NOTE — Telephone Encounter (Signed)
Attempted call to follow up. Client listed in EPIC no PCP, has had no recent visit to [facility].  No answer, left voicemail requesting return call. Checked [employer], no medicaid showing.  Francee Nodal RN [employer]

## 2022-01-31 ENCOUNTER — Other Ambulatory Visit: Payer: Self-pay

## 2022-01-31 ENCOUNTER — Encounter (HOSPITAL_COMMUNITY): Payer: Self-pay | Admitting: Emergency Medicine

## 2022-01-31 ENCOUNTER — Inpatient Hospital Stay (HOSPITAL_COMMUNITY)
Admission: EM | Admit: 2022-01-31 | Discharge: 2022-02-03 | DRG: 638 | Disposition: A | Discharge status: Home / Self Care | Payer: Self-pay | Attending: Family Medicine | Admitting: Family Medicine

## 2022-01-31 DIAGNOSIS — E860 Dehydration: Secondary | ICD-10-CM | POA: Diagnosis present

## 2022-01-31 DIAGNOSIS — D75839 Thrombocytosis, unspecified: Secondary | ICD-10-CM | POA: Insufficient documentation

## 2022-01-31 DIAGNOSIS — Z833 Family history of diabetes mellitus: Secondary | ICD-10-CM

## 2022-01-31 DIAGNOSIS — N179 Acute kidney failure, unspecified: Secondary | ICD-10-CM | POA: Diagnosis present

## 2022-01-31 DIAGNOSIS — F1410 Cocaine abuse, uncomplicated: Secondary | ICD-10-CM | POA: Diagnosis present

## 2022-01-31 DIAGNOSIS — I129 Hypertensive chronic kidney disease with stage 1 through stage 4 chronic kidney disease, or unspecified chronic kidney disease: Secondary | ICD-10-CM | POA: Diagnosis present

## 2022-01-31 DIAGNOSIS — Z716 Tobacco abuse counseling: Secondary | ICD-10-CM

## 2022-01-31 DIAGNOSIS — Z87891 Personal history of nicotine dependence: Secondary | ICD-10-CM

## 2022-01-31 DIAGNOSIS — E1122 Type 2 diabetes mellitus with diabetic chronic kidney disease: Secondary | ICD-10-CM | POA: Diagnosis present

## 2022-01-31 DIAGNOSIS — I10 Essential (primary) hypertension: Secondary | ICD-10-CM | POA: Diagnosis present

## 2022-01-31 DIAGNOSIS — Z7984 Long term (current) use of oral hypoglycemic drugs: Secondary | ICD-10-CM

## 2022-01-31 DIAGNOSIS — E1100 Type 2 diabetes mellitus with hyperosmolarity without nonketotic hyperglycemic-hyperosmolar coma (NKHHC): Principal | ICD-10-CM | POA: Diagnosis present

## 2022-01-31 DIAGNOSIS — N1832 Chronic kidney disease, stage 3b: Secondary | ICD-10-CM | POA: Diagnosis present

## 2022-01-31 DIAGNOSIS — Z8249 Family history of ischemic heart disease and other diseases of the circulatory system: Secondary | ICD-10-CM

## 2022-01-31 DIAGNOSIS — N189 Chronic kidney disease, unspecified: Secondary | ICD-10-CM

## 2022-01-31 DIAGNOSIS — R7989 Other specified abnormal findings of blood chemistry: Secondary | ICD-10-CM | POA: Insufficient documentation

## 2022-01-31 DIAGNOSIS — I160 Hypertensive urgency: Secondary | ICD-10-CM | POA: Diagnosis present

## 2022-01-31 DIAGNOSIS — Z79899 Other long term (current) drug therapy: Secondary | ICD-10-CM

## 2022-01-31 DIAGNOSIS — Z720 Tobacco use: Secondary | ICD-10-CM | POA: Diagnosis present

## 2022-01-31 DIAGNOSIS — Z794 Long term (current) use of insulin: Secondary | ICD-10-CM

## 2022-01-31 LAB — CBG MONITORING, ED
Glucose-Capillary: >600 mg/dL (ref 70–99)
Glucose-Capillary: 573 mg/dL (ref 70–99)
Glucose-Capillary: 574 mg/dL (ref 70–99)
Glucose-Capillary: 427 mg/dL — ABNORMAL HIGH (ref 70–99)
Glucose-Capillary: 572 mg/dL (ref 70–99)
Glucose-Capillary: 299 mg/dL — ABNORMAL HIGH (ref 70–99)
Glucose-Capillary: 236 mg/dL — ABNORMAL HIGH (ref 70–99)
Glucose-Capillary: 197 mg/dL — ABNORMAL HIGH (ref 70–99)
Glucose-Capillary: 188 mg/dL — ABNORMAL HIGH (ref 70–99)
Glucose-Capillary: 245 mg/dL — ABNORMAL HIGH (ref 70–99)
Glucose-Capillary: 251 mg/dL — ABNORMAL HIGH (ref 70–99)
Glucose-Capillary: 239 mg/dL — ABNORMAL HIGH (ref 70–99)
Glucose-Capillary: 171 mg/dL — ABNORMAL HIGH (ref 70–99)
Glucose-Capillary: 156 mg/dL — ABNORMAL HIGH (ref 70–99)
Glucose-Capillary: 163 mg/dL — ABNORMAL HIGH (ref 70–99)

## 2022-01-31 LAB — BASIC METABOLIC PANEL
Anion gap: 16 — ABNORMAL HIGH (ref 5–15)
Anion gap: 17 — ABNORMAL HIGH (ref 5–15)
Anion gap: 13 (ref 5–15)
Anion gap: 9 (ref 5–15)
BUN: 21 mg/dL — ABNORMAL HIGH (ref 6–20)
BUN: 19 mg/dL (ref 6–20)
BUN: 19 mg/dL (ref 6–20)
BUN: 18 mg/dL (ref 6–20)
CO2: 25 mmol/L (ref 22–32)
CO2: 22 mmol/L (ref 22–32)
CO2: 27 mmol/L (ref 22–32)
CO2: 27 mmol/L (ref 22–32)
Calcium: 9.7 mg/dL (ref 8.9–10.3)
Calcium: 9.5 mg/dL (ref 8.9–10.3)
Calcium: 9.4 mg/dL (ref 8.9–10.3)
Calcium: 9.1 mg/dL (ref 8.9–10.3)
Chloride: 87 mmol/L — ABNORMAL LOW (ref 98–111)
Chloride: 95 mmol/L — ABNORMAL LOW (ref 98–111)
Chloride: 97 mmol/L — ABNORMAL LOW (ref 98–111)
Chloride: 101 mmol/L (ref 98–111)
Creatinine, Ser: 1.67 mg/dL — ABNORMAL HIGH (ref 0.44–1.00)
Creatinine, Ser: 1.55 mg/dL — ABNORMAL HIGH (ref 0.44–1.00)
Creatinine, Ser: 1.45 mg/dL — ABNORMAL HIGH (ref 0.44–1.00)
Creatinine, Ser: 1.4 mg/dL — ABNORMAL HIGH (ref 0.44–1.00)
GFR, Estimated: 37 mL/min — ABNORMAL LOW (ref >60)
GFR, Estimated: 41 mL/min — ABNORMAL LOW (ref >60)
GFR, Estimated: 44 mL/min — ABNORMAL LOW (ref >60)
GFR, Estimated: 46 mL/min — ABNORMAL LOW (ref >60)
Glucose, Bld: 742 mg/dL (ref 70–99)
Glucose, Bld: 480 mg/dL — ABNORMAL HIGH (ref 70–99)
Glucose, Bld: 204 mg/dL — ABNORMAL HIGH (ref 70–99)
Glucose, Bld: 158 mg/dL — ABNORMAL HIGH (ref 70–99)
Potassium: 4.1 mmol/L (ref 3.5–5.1)
Potassium: 4.1 mmol/L (ref 3.5–5.1)
Potassium: 3.6 mmol/L (ref 3.5–5.1)
Potassium: 3.4 mmol/L — ABNORMAL LOW (ref 3.5–5.1)
Sodium: 128 mmol/L — ABNORMAL LOW (ref 135–145)
Sodium: 134 mmol/L — ABNORMAL LOW (ref 135–145)
Sodium: 137 mmol/L (ref 135–145)
Sodium: 137 mmol/L (ref 135–145)

## 2022-01-31 LAB — URINALYSIS, MICROSCOPIC (REFLEX)

## 2022-01-31 LAB — BLOOD GAS, VENOUS
Acid-Base Excess: 3.6 mmol/L — ABNORMAL HIGH (ref 0.0–2.0)
Bicarbonate: 30.1 mmol/L — ABNORMAL HIGH (ref 20.0–28.0)
Drawn by: 1528
O2 Saturation: 48.8 %
Patient temperature: 36.8
pCO2, Ven: 52 mmHg (ref 44–60)
pH, Ven: 7.37 (ref 7.25–7.43)
pO2, Ven: 31 mmHg — CL (ref 32–45)

## 2022-01-31 LAB — URINALYSIS, ROUTINE W REFLEX MICROSCOPIC
Bilirubin Urine: NEGATIVE
Glucose, UA: >=500 mg/dL — AB
Ketones, ur: NEGATIVE mg/dL
Leukocytes,Ua: NEGATIVE
Nitrite: NEGATIVE
Protein, ur: 100 mg/dL — AB
Specific Gravity, Urine: 1.01 (ref 1.005–1.030)
pH: 6.5 (ref 5.0–8.0)

## 2022-01-31 LAB — BETA-HYDROXYBUTYRIC ACID: Beta-Hydroxybutyric Acid: 1.34 mmol/L — ABNORMAL HIGH (ref 0.05–0.27)

## 2022-01-31 LAB — CBC
HCT: 47 % — ABNORMAL HIGH (ref 36.0–46.0)
Hemoglobin: 16.2 g/dL — ABNORMAL HIGH (ref 12.0–15.0)
MCH: 31.7 pg (ref 26.0–34.0)
MCHC: 34.5 g/dL (ref 30.0–36.0)
MCV: 92 fL (ref 80.0–100.0)
Platelets: 402 10*3/uL — ABNORMAL HIGH (ref 150–400)
RBC: 5.11 MIL/uL (ref 3.87–5.11)
RDW: 12.2 % (ref 11.5–15.5)
WBC: 9.3 10*3/uL (ref 4.0–10.5)
nRBC: 0 % (ref 0.0–0.2)

## 2022-01-31 LAB — POC URINE PREG, ED: Preg Test, Ur: NEGATIVE

## 2022-01-31 LAB — GLUCOSE, CAPILLARY: Glucose-Capillary: 218 mg/dL — ABNORMAL HIGH (ref 70–99)

## 2022-01-31 MED ORDER — LACTATED RINGERS IV BOLUS
1000.0000 mL | Freq: Once | INTRAVENOUS | Status: AC
  Administered 2022-01-31: 1000 mL via INTRAVENOUS

## 2022-01-31 MED ORDER — ACETAMINOPHEN 650 MG RE SUPP: 650.0000 mg | Freq: Four times a day (QID) | RECTAL | Status: DC | PRN

## 2022-01-31 MED ORDER — ENOXAPARIN SODIUM 40 MG/0.4ML IJ SOSY
40.0000 mg | PREFILLED_SYRINGE | INTRAMUSCULAR | Status: DC
  Administered 2022-01-31 – 2022-02-01 (×2): 40 mg via SUBCUTANEOUS
  Filled 2022-01-31 (×2): qty 0.4

## 2022-01-31 MED ORDER — DEXTROSE IN LACTATED RINGERS 5 % IV SOLN: INTRAVENOUS | Status: DC

## 2022-01-31 MED ORDER — ACETAMINOPHEN 325 MG PO TABS
650.0000 mg | ORAL_TABLET | Freq: Four times a day (QID) | ORAL | Status: DC | PRN
  Administered 2022-01-31: 650 mg via ORAL
  Filled 2022-01-31: qty 2

## 2022-01-31 MED ORDER — ACETAMINOPHEN 325 MG PO TABS
650.0000 mg | ORAL_TABLET | Freq: Four times a day (QID) | ORAL | Status: DC
  Administered 2022-01-31 – 2022-02-03 (×9): 650 mg via ORAL
  Filled 2022-01-31 (×9): qty 2

## 2022-01-31 MED ORDER — DEXTROSE-NACL 5-0.9 % IV SOLN: INTRAVENOUS | Status: DC

## 2022-01-31 MED ORDER — INSULIN ASPART 100 UNIT/ML IJ SOLN
0.0000 [IU] | Freq: Every day | INTRAMUSCULAR | Status: DC
  Administered 2022-01-31: 2 [IU] via SUBCUTANEOUS
  Administered 2022-02-01: 5 [IU] via SUBCUTANEOUS

## 2022-01-31 MED ORDER — POTASSIUM CHLORIDE 10 MEQ/100ML IV SOLN
10.0000 meq | INTRAVENOUS | Status: AC
  Administered 2022-01-31 (×2): 10 meq via INTRAVENOUS
  Filled 2022-01-31 (×2): qty 100

## 2022-01-31 MED ORDER — INSULIN GLARGINE-YFGN 100 UNIT/ML ~~LOC~~ SOLN
20.0000 [IU] | Freq: Every day | SUBCUTANEOUS | Status: DC
  Administered 2022-01-31: 20 [IU] via SUBCUTANEOUS
  Filled 2022-01-31 (×3): qty 0.2

## 2022-01-31 MED ORDER — DEXTROSE 50 % IV SOLN: 0.0000 mL | INTRAVENOUS | Status: DC | PRN

## 2022-01-31 MED ORDER — KETOROLAC TROMETHAMINE 15 MG/ML IJ SOLN
15.0000 mg | Freq: Four times a day (QID) | INTRAMUSCULAR | Status: DC | PRN
  Administered 2022-02-01: 15 mg via INTRAVENOUS
  Filled 2022-01-31: qty 1

## 2022-01-31 MED ORDER — INSULIN ASPART 100 UNIT/ML IJ SOLN
0.0000 [IU] | Freq: Three times a day (TID) | INTRAMUSCULAR | Status: DC
  Administered 2022-01-31: 3 [IU] via SUBCUTANEOUS
  Administered 2022-02-01 (×2): 15 [IU] via SUBCUTANEOUS
  Administered 2022-02-02: 3 [IU] via SUBCUTANEOUS
  Administered 2022-02-02 (×2): 15 [IU] via SUBCUTANEOUS
  Filled 2022-01-31: qty 1

## 2022-01-31 MED ORDER — LACTATED RINGERS IV SOLN: INTRAVENOUS | Status: DC

## 2022-01-31 MED ORDER — METOPROLOL TARTRATE 50 MG PO TABS
100.0000 mg | ORAL_TABLET | Freq: Two times a day (BID) | ORAL | Status: DC
  Administered 2022-01-31 – 2022-02-03 (×7): 100 mg via ORAL
  Filled 2022-01-31 (×7): qty 2

## 2022-01-31 MED ORDER — LABETALOL HCL 5 MG/ML IV SOLN: 10.0000 mg | INTRAVENOUS | Status: DC | PRN

## 2022-01-31 MED ORDER — ONDANSETRON HCL 4 MG PO TABS: 4.0000 mg | ORAL_TABLET | Freq: Four times a day (QID) | ORAL | Status: DC | PRN

## 2022-01-31 MED ORDER — HYDRALAZINE HCL 25 MG PO TABS
50.0000 mg | ORAL_TABLET | Freq: Three times a day (TID) | ORAL | Status: DC
  Administered 2022-01-31 – 2022-02-03 (×8): 50 mg via ORAL
  Filled 2022-01-31 (×8): qty 2

## 2022-01-31 MED ORDER — ONDANSETRON HCL 4 MG/2ML IJ SOLN: 4.0000 mg | Freq: Four times a day (QID) | INTRAMUSCULAR | Status: DC | PRN

## 2022-01-31 MED ORDER — ACETAMINOPHEN 650 MG RE SUPP
650.0000 mg | Freq: Four times a day (QID) | RECTAL | Status: DC
  Filled 2022-01-31: qty 1

## 2022-01-31 MED ORDER — POTASSIUM CHLORIDE CRYS ER 20 MEQ PO TBCR
40.0000 meq | EXTENDED_RELEASE_TABLET | Freq: Once | ORAL | Status: AC
  Administered 2022-01-31: 40 meq via ORAL
  Filled 2022-01-31: qty 2

## 2022-01-31 MED ORDER — INSULIN REGULAR(HUMAN) IN NACL 100-0.9 UT/100ML-% IV SOLN
INTRAVENOUS | Status: DC
  Administered 2022-01-31: 11.5 [IU]/h via INTRAVENOUS
  Filled 2022-01-31: qty 100

## 2022-01-31 NOTE — ED Notes (Signed)
Provider at bedside 

## 2022-01-31 NOTE — ED Provider Notes (Signed)
[facility] EMERGENCY DEPARTMENT  Provider Note  CSN: [id number] Arrival date & time: 01/31/22 0151  History Chief Complaint  Patient presents with   Hyperglycemia    MAISLEY HAINSWORTH is a 52 y.o. female with history of poorly controlled DM on 'insulin' which she reports she takes every day, but is unsure what kind although she is sure her dose is 10 Units (EMR seems to indicate this is Lantus). She does not check her sugar at home. She reports she woke up during the night and could not see, has had visual disturbances before with high sugar. EMS was called and found her to have CBG reading High. She reports cocaine use earlier in the day, although she is trying to quit. States she no longer smokes or drinks alcohol. She is complaining of dry mouth, headache and blurry vision although she is able to see better now compared to earlier. No recent fevers. One episode of vomiting. No diarrhea or dysuria.    Home Medications Prior to Admission medications   Medication Sig Start Date End Date Taking? Authorizing Provider  cephALEXin (KEFLEX) 500 MG capsule Take 1 capsule (500 mg total) by mouth 4 (four) times daily. 07/28/21   Mesner, Corene Cornea, MD  insulin glargine (LANTUS SOLOSTAR) 100 UNIT/ML Solostar Pen Inject 10 Units into the skin daily. 03/08/21   Soyla Dryer, PA-C  metFORMIN (GLUCOPHAGE) 1000 MG tablet Take 1 tablet (1,000 mg total) by mouth 2 (two) times daily with a meal. 02/23/21   Dessa Phi, DO  metoprolol tartrate (LOPRESSOR) 100 MG tablet Take 1 tablet (100 mg total) by mouth 2 (two) times daily. 06/13/21 09/11/21  Deatra James, MD     Allergies    Patient has no known allergies.   Review of Systems   Review of Systems Please see HPI for pertinent positives and negatives  Physical Exam BP (!) 190/115   Pulse (!) 120   Temp 98.3 F (36.8 C) (Oral)   Resp (!) 24   Ht 5\' 1"  (1.549 m)   Wt 76.2 kg   SpO2 97%   BMI 31.74 kg/m   Physical Exam Vitals and nursing  note reviewed.  Constitutional:      Appearance: Normal appearance.  HENT:     Head: Normocephalic and atraumatic.     Nose: Nose normal.     Mouth/Throat:     Mouth: Mucous membranes are dry.  Eyes:     Extraocular Movements: Extraocular movements intact.     Conjunctiva/sclera: Conjunctivae normal.     Comments: Visual acuity grossly intact  Cardiovascular:     Rate and Rhythm: Tachycardia present.  Pulmonary:     Effort: Pulmonary effort is normal.     Breath sounds: Normal breath sounds.  Abdominal:     General: Abdomen is flat.     Palpations: Abdomen is soft.     Tenderness: There is no abdominal tenderness.  Musculoskeletal:        General: No swelling. Normal range of motion.     Cervical back: Neck supple.  Skin:    General: Skin is warm and dry.  Neurological:     General: No focal deficit present.     Mental Status: She is alert and oriented to person, place, and time.  Psychiatric:        Mood and Affect: Mood normal.     ED Results / Procedures / Treatments   EKG EKG Interpretation  Date/Time:  Tuesday January 31 2022 02:03:25 EST  Ventricular Rate:  102 PR Interval:  96 QRS Duration: 90 QT Interval:  375 QTC Calculation: 489 R Axis:   78 Text Interpretation: Sinus tachycardia Biatrial enlargement Probable left ventricular hypertrophy Nonspecific T abnormalities, lateral leads Borderline prolonged QT interval No significant change since last tracing Confirmed by Calvert Cantor [phone]) on 01/31/2022 2:12:19 AM  Procedures .Critical Care  Performed by: Truddie Hidden, MD Authorized by: Truddie Hidden, MD   Critical care provider statement:    Critical care time (minutes):  45   Critical care time was exclusive of:  Separately billable procedures and treating other patients   Critical care was necessary to treat or prevent imminent or life-threatening deterioration of the following conditions:  Endocrine crisis   Critical care was time spent  personally by me on the following activities:  Development of treatment plan with patient or surrogate, discussions with consultants, evaluation of patient's response to treatment, examination of patient, ordering and review of laboratory studies, ordering and review of radiographic studies, ordering and performing treatments and interventions, pulse oximetry, re-evaluation of patient's condition and review of old charts   Care discussed with: admitting provider     Medications Ordered in the ED Medications  insulin regular, human (MYXREDLIN) 100 units/ 100 mL infusion (11.5 Units/hr Intravenous New Bag/Given 01/31/22 0355)  lactated ringers infusion ( Intravenous New Bag/Given 01/31/22 0356)  dextrose 5 % in lactated ringers infusion (has no administration in time range)  dextrose 50 % solution 0-50 mL (has no administration in time range)  potassium chloride 10 mEq in 100 mL IVPB (10 mEq Intravenous New Bag/Given 01/31/22 0350)  lactated ringers bolus 1,000 mL (1,000 mLs Intravenous New Bag/Given 01/31/22 0255)    Initial Impression and Plan  Patient here with hyperglycemia with subsequent blurry vision and headache. She is noted to be tachycardic and hypertensive, likely due to both hyperglycemia and cocaine use. Will check labs, begin IVF and reassess.   ED Course   Clinical Course as of 01/31/22 0422  Tue Jan 31, 2022  0229 UA with glucose, no ketones or signs of infection.  [CS]  7124 BMP with hyperglycemia, pseudohyponatremia, normal bicarb and only mildly elevated anion gap. No concern for DKA, but will begin Endotool for HHS.  [CS]  5809 CBC with ?hemoconcentration, but normal WBC [CS]  0329 BHB is mildly elevated.  [CS]  9833 VBG without acidosis. Hospitalist paged for admission.  [CS]  0421 Spoke with Dr. Josephine Cables, Hospitalist, who will evaluate for admission.  [CS]    Clinical Course User Index [CS] Truddie Hidden, MD     MDM Rules/Calculators/A&P Medical Decision  Making Problems Addressed: Cocaine abuse [facility]): chronic illness or injury Hyperosmolar hyperglycemic state (HHS) [facility]): acute illness or injury  Amount and/or Complexity of Data Reviewed Labs: ordered. Decision-making details documented in ED Course. ECG/medicine tests: ordered and independent interpretation performed. Decision-making details documented in ED Course.  Risk Prescription drug management. Drug therapy requiring intensive monitoring for toxicity. Decision regarding hospitalization.    Final Clinical Impression(s) / ED Diagnoses Final diagnoses:  Hyperosmolar hyperglycemic state (HHS) ([place])  Cocaine abuse [facility])    Rx / DC Orders ED Discharge Orders     None        Truddie Hidden, MD 01/31/22 0422

## 2022-01-31 NOTE — Inpatient Diabetes Management (Addendum)
Inpatient Diabetes Program Recommendations  AACE/ADA: New Consensus Statement on Inpatient Glycemic Control   Target Ranges:  Prepandial:   less than 140 mg/dL      Peak postprandial:   less than 180 mg/dL (1-2 hours)      Critically ill patients:  140 - 180 mg/dL    Latest Reference Range & Units 01/31/22 04:33 01/31/22 05:02 01/31/22 05:32 01/31/22 06:07 01/31/22 06:38  Glucose-Capillary 70 - 99 mg/dL 573 (HH) 574 (HH) 572 (HH) 427 (H) 299 (H)    Latest Reference Range & Units 01/31/22 02:16  CO2 22 - 32 mmol/L 25  Glucose 70 - 99 mg/dL 742 (HH)  Anion gap 5 - 15  16 (H)   Review of Glycemic Control  Diabetes history: DM2 Outpatient Diabetes medications: Lantus 10 units QHS, Metformin 1000 mg BID Current orders for Inpatient glycemic control: IV insulin  Inpatient Diabetes Program Recommendations:    Insulin: Once glucose improved and provider is ready to transition from IV to SQ insulin, please consider ordering Lantus 10 units Q24H, CBGs Q4H, Novolog 0-9 units Q4H.  HbgA1C: Please consider ordering an A1C to evaluate glycemic control over the past 2-3 months.  NOTE: Per H&P, patient admitted with HHS and AKI; "Patient states that she has been compliant with her insulin, though she was not sure of the type of insulin she takes, patient also does not check her blood glucose level at home. "  Initial glucose 742 mg/dl on 01/31/22 at 2:16 am and patient was started on IV insulin. Will plan to speak with patient today.  Addendum 01/31/22@11 :20-Spoke with patient at bedside in ED. Patient sleeping initially but woke to name being called. Patient reports that she takes 10 units of her insulin every day and she does not miss or skip taking insulin. Inquired about Metformin and patient states she does not take the Metformin due to GI upset. Patient reports that she does not check her glucose at all. When asked why, patient shrugged her shoulders and stated "I just don't". Patient states she  has everything at home to check her glucose. Patient states that she goes to local clinic for PCP and gets her meds from the clinic. Patient reports that she does not recall what her A1C was when last checked.  Informed patient that A1C has been ordered and should be resulted by tomorrow. Discussed glucose and A1C goals. Discussed importance of checking CBGs and maintaining good CBG control to prevent long-term and short-term complications. Explained how hyperglycemia leads to damage within blood vessels which lead to the common complications seen with uncontrolled diabetes. Stressed to the patient the importance of improving glycemic control to prevent further complications from uncontrolled diabetes. Discussed impact of nutrition, exercise, stress, sickness, and medications on diabetes control.  Asked patient to start checking her glucose at least 2-3 times per day and follow up with the clinic in the near future regarding DM control. Patient reports that she has plenty of insulin and testing supplies at home and has no needs at this time.   Patient verbalized understanding of information discussed and reports no further questions at this time related to diabetes.   Thanks, Barnie Alderman, RN, MSN, [place] Diabetes Coordinator Inpatient Diabetes Program [phone] (Team Pager from 8am to [place])

## 2022-01-31 NOTE — ED Notes (Signed)
Awaiting Semglee from pharmacy

## 2022-01-31 NOTE — H&P (Signed)
History and Physical    Patient: Megan Mullins ZOX:[id number] DOB: 02/03/1970 DOA: 01/31/2022 DOS: the patient was seen and examined on 01/31/2022 PCP: Pcp, No  Patient coming from: Home  Chief Complaint:  Chief Complaint  Patient presents with   Hyperglycemia   HPI: Megan Mullins is a 52 y.o. female with medical history significant of hypertension, type 2 diabetes mellitus who presents to the emergency department due to elevated blood glucose level.  Patient states that she has been compliant with her insulin, though she was not sure of the type of insulin she takes, patient also does not check her blood glucose level at home.  Patient states that on waking up during the night, she had difficulty in being able to see and that she has had similar symptoms in the past when her blood glucose was elevated.  She activated EMS, and on arrival of EMS team, blood glucose was checked and was noted to be high.  She complained of headache, blurry vision, dry mouth and endorsed nonbloody vomiting x 1.  Patient endorsed use of cocaine. She denies diarrhea, abdominal pain, burning sensation with urination.  ED Course:  In the emergency department, patient was tachycardic and intermittently tachypneic.  BP was 186/134, other vital signs are within normal range.  Workup in the ED showed normal CBC except for hemoglobin of 16.2, hematocrit 47.0 and platelets of 402.  BMP showed sodium of 128 potassium 4.1, chloride 87, bicarb 25, blood glucose of 742, BUN/creatinine 21/1.67 (baseline creatinine at 1.2-1.4), EGFR 37, anion gap 16.  Beta hydroxybutyrate acid 1.34, urinalysis was positive for glycosuria and proteinuria.  Pregnancy test was negative. Patient was started on insulin drip per Endo tool, IV hydration was provided.  Hospitalist was asked to admit patient for further evaluation and management.  Review of Systems: Review of systems as noted in the HPI. All other systems reviewed and are negative.   Past  Medical History:  Diagnosis Date   Diabetes mellitus without complication ([place])    Past Surgical History:  Procedure Laterality Date   TUBAL LIGATION      Social History:  reports that she quit smoking about 2 weeks ago. Her smoking use included cigarettes. She smoked an average of .5 packs per day. She has never used smokeless tobacco. She reports that she does not currently use alcohol. She reports that she does not currently use drugs after having used the following drugs: Cocaine.   No Known Allergies  Family History  Problem Relation Age of Onset   Heart disease Mother    Diabetes Mother      Prior to Admission medications   Medication Sig Start Date End Date Taking? Authorizing Provider  cephALEXin (KEFLEX) 500 MG capsule Take 1 capsule (500 mg total) by mouth 4 (four) times daily. 07/28/21   Mesner, Corene Cornea, MD  insulin glargine (LANTUS SOLOSTAR) 100 UNIT/ML Solostar Pen Inject 10 Units into the skin daily. 03/08/21   Soyla Dryer, PA-C  metFORMIN (GLUCOPHAGE) 1000 MG tablet Take 1 tablet (1,000 mg total) by mouth 2 (two) times daily with a meal. 02/23/21   Dessa Phi, DO  metoprolol tartrate (LOPRESSOR) 100 MG tablet Take 1 tablet (100 mg total) by mouth 2 (two) times daily. 06/13/21 09/11/21  Deatra James, MD    Physical Exam: BP (!) 155/108   Pulse (!) 102   Temp 98.3 F (36.8 C) (Oral)   Resp 14   Ht 5\' 1"  (1.549 m)   Wt 76.2 kg  SpO2 100%   BMI 31.74 kg/m   General: 52 y.o. year-old female well developed well nourished in no acute distress.  Alert and oriented x3. HEENT: NCAT, EOMI, dry mucous membrane Neck: Supple, trachea medial Cardiovascular: Tachycardia.  Regular rate and rhythm with no rubs or gallops.  No thyromegaly or JVD noted.  No lower extremity edema. 2/4 pulses in all 4 extremities. Respiratory: Clear to auscultation with no wheezes or rales. Good inspiratory effort. Abdomen: Soft, nontender nondistended with normal bowel sounds x4  quadrants. Muskuloskeletal: No cyanosis, clubbing or edema noted bilaterally Neuro: CN II-XII intact, strength 5/5 x 4, sensation, reflexes intact Skin: No ulcerative lesions noted or rashes Psychiatry: Mood is appropriate for condition and setting          Labs on Admission:  Basic Metabolic Panel: Recent Labs  Lab 01/31/22 0216  NA 128*  K 4.1  CL 87*  CO2 25  GLUCOSE 742*  BUN 21*  CREATININE 1.67*  CALCIUM 9.7   Liver Function Tests: No results for input(s): "AST", "ALT", "ALKPHOS", "BILITOT", "PROT", "ALBUMIN" in the last 168 hours. No results for input(s): "LIPASE", "AMYLASE" in the last 168 hours. No results for input(s): "AMMONIA" in the last 168 hours. CBC: Recent Labs  Lab 01/31/22 0216  WBC 9.3  HGB 16.2*  HCT 47.0*  MCV 92.0  PLT 402*   Cardiac Enzymes: No results for input(s): "CKTOTAL", "CKMB", "CKMBINDEX", "TROPONINI" in the last 168 hours.  BNP (last 3 results) No results for input(s): "BNP" in the last 8760 hours.  ProBNP (last 3 results) No results for input(s): "PROBNP" in the last 8760 hours.  CBG: Recent Labs  Lab 01/31/22 0201 01/31/22 0433 01/31/22 0502 01/31/22 0532 01/31/22 0607  GLUCAP >600* 573* 574* 572* 427*    Radiological Exams on Admission: No results found.  EKG: I independently viewed the EKG done and my findings are as followed: Sinus tachycardia at a rate of 102 bpm  Assessment/Plan Present on Admission:  Hyperosmolar hyperglycemic state (HHS) (HCC)  Acute kidney injury superimposed on chronic kidney disease (HCC)  Essential hypertension  Tobacco abuse  Principal Problem:   Hyperosmolar hyperglycemic state (HHS) (HCC) Active Problems:   Essential hypertension   Tobacco abuse   Acute kidney injury superimposed on chronic kidney disease (HCC)   Pseudohyponatremia   Thrombocytosis   Hyperosmolar hyperglycemic state/DKA Hyperglycemia secondary to poorly controlled type 2 diabetes mellitus Continue insulin  drip, IV LR with IV potassium per DKA protocol Transition IV LR to D5 LR when serum glucose reaches 250mg /dL Continue serial BMP  Continue to monitor for anion gap closure prior to transitioning patient to subcu insulin Continue NPO  Pseudohyponatremia Na 128, calculated sodium correction based on CBG (742) is 138 Continue to monitor sodium level  Acute kidney injury on CKD 3B Dehydration BUN/creatinine 21/1.67 (baseline creatinine at 1.2-1.4), EGFR 37 Continue IV hydration Renally adjust medications, avoid nephrotoxic agents/dehydration/hypotension  Chronic thrombocytosis Platelets 402, continue to monitor platelet levels with morning labs  Essential hypertension Continue metoprolol  Tobacco abuse Patient was counseled on tobacco abuse cessation  DVT prophylaxis: Lovenox  Code Status: Full code  Family Communication: None at bedside  Consults: None  Severity of Illness: The appropriate patient status for this patient is OBSERVATION. Observation status is judged to be reasonable and necessary in order to provide the required intensity of service to ensure the patient's safety. The patient's presenting symptoms, physical exam findings, and initial radiographic and laboratory data in the context of their medical condition  is felt to place them at decreased risk for further clinical deterioration. Furthermore, it is anticipated that the patient will be medically stable for discharge from the hospital within 2 midnights of admission.   Author: Bernadette Hoit, DO 01/31/2022 6:21 AM  For on call review [email or web address].

## 2022-01-31 NOTE — ED Notes (Signed)
Patient is refusing to let this nurse tech check her blood glucose. This tech attempted x3 and the patient rolled her eyes and kicked her feet. Nurse notified.

## 2022-01-31 NOTE — TOC Progression Note (Signed)
Transition of Care [facility]) - Progression Note    Patient Details  Name: Megan Mullins MRN: [id number] Date of Birth: [birth year]  Transition of Care [facility]) CM/SW Contact  Salome Arnt, [place] Phone Number: 01/31/2022, 8:27 AM  Clinical Narrative:  LCSW noted pt does not have insurance and no PCP. RN asked pt if she was agreeable to [facility] referral and pt agreed. LCSW spoke with Mardene Celeste at [employer] who reports pt is already working with them. However, she has missed some appointments. [facility] will reach back out to pt for follow up.           Expected Discharge Plan and Services                                               Social Determinants of Health (SDOH) Interventions SDOH Screenings   Tobacco Use: Medium Risk (01/31/2022)    Readmission Risk Interventions     No data to display

## 2022-01-31 NOTE — Progress Notes (Signed)
She is 52 year old lady with history of type 2 diabetes mellitus as well as hypertension who presented with hyperglycemia and was admitted with HHS.  Blood sugar better than yesterday, still has high anion gap, DKA ruled out.  Continue insulin drip per protocol and transition to long-acting insulin along with SSI once her gap is closed.  Her blood pressure significantly elevated, she appears to be not taking any medications at home other than metoprolol.  I will start her on hydralazine 50 mg 3 times daily and place her on as needed labetalol.  Patient has no complaints today.  She was seen and evaluated in the emergency department.  She says she takes only 10 units of insulin at home but she cannot tell me the type of insulin and she does not check her blood sugar, indicating that she is noncompliant.  I spent total of 21 minutes on encounter today.

## 2022-01-31 NOTE — ED Triage Notes (Signed)
Pt to ed via [facility]. C/o of hyperglycemia, HTN, and headache with visual changes. Pt ambulated to the bathroom.

## 2022-01-31 NOTE — ED Notes (Signed)
Provider at bedside

## 2022-01-31 NOTE — ED Notes (Signed)
No D5 in LR currently stocked in department. D5% in lr requested from materials

## 2022-01-31 NOTE — ED Notes (Signed)
Pt sitting up in bed, AxOx4. Snack provided to patient while awaiting dinner tray.

## 2022-02-01 LAB — CBC
HCT: 42.7 % (ref 36.0–46.0)
Hemoglobin: 14.1 g/dL (ref 12.0–15.0)
MCH: 31.5 pg (ref 26.0–34.0)
MCHC: 33 g/dL (ref 30.0–36.0)
MCV: 95.5 fL (ref 80.0–100.0)
Platelets: 377 10*3/uL (ref 150–400)
RBC: 4.47 MIL/uL (ref 3.87–5.11)
RDW: 12.5 % (ref 11.5–15.5)
WBC: 9 10*3/uL (ref 4.0–10.5)
nRBC: 0 % (ref 0.0–0.2)

## 2022-02-01 LAB — COMPREHENSIVE METABOLIC PANEL
ALT: 13 U/L (ref 0–44)
AST: 16 U/L (ref 15–41)
Albumin: 2.7 g/dL — ABNORMAL LOW (ref 3.5–5.0)
Alkaline Phosphatase: 81 U/L (ref 38–126)
Anion gap: 9 (ref 5–15)
BUN: 28 mg/dL — ABNORMAL HIGH (ref 6–20)
CO2: 27 mmol/L (ref 22–32)
Calcium: 9 mg/dL (ref 8.9–10.3)
Chloride: 99 mmol/L (ref 98–111)
Creatinine, Ser: 2.58 mg/dL — ABNORMAL HIGH (ref 0.44–1.00)
GFR, Estimated: 22 mL/min — ABNORMAL LOW (ref >60)
Glucose, Bld: 419 mg/dL — ABNORMAL HIGH (ref 70–99)
Potassium: 3.9 mmol/L (ref 3.5–5.1)
Sodium: 135 mmol/L (ref 135–145)
Total Bilirubin: 0.4 mg/dL (ref 0.3–1.2)
Total Protein: 6.9 g/dL (ref 6.5–8.1)

## 2022-02-01 LAB — BASIC METABOLIC PANEL
Anion gap: 9 (ref 5–15)
BUN: 29 mg/dL — ABNORMAL HIGH (ref 6–20)
CO2: 25 mmol/L (ref 22–32)
Calcium: 8.6 mg/dL — ABNORMAL LOW (ref 8.9–10.3)
Chloride: 98 mmol/L (ref 98–111)
Creatinine, Ser: 2.53 mg/dL — ABNORMAL HIGH (ref 0.44–1.00)
GFR, Estimated: 23 mL/min — ABNORMAL LOW (ref >60)
Glucose, Bld: 443 mg/dL — ABNORMAL HIGH (ref 70–99)
Potassium: 4.1 mmol/L (ref 3.5–5.1)
Sodium: 132 mmol/L — ABNORMAL LOW (ref 135–145)

## 2022-02-01 LAB — GLUCOSE, CAPILLARY
Glucose-Capillary: 392 mg/dL — ABNORMAL HIGH (ref 70–99)
Glucose-Capillary: 426 mg/dL — ABNORMAL HIGH (ref 70–99)
Glucose-Capillary: 84 mg/dL (ref 70–99)
Glucose-Capillary: 393 mg/dL — ABNORMAL HIGH (ref 70–99)

## 2022-02-01 LAB — HEMOGLOBIN A1C
Hgb A1c MFr Bld: >15.5 % — ABNORMAL HIGH (ref 4.8–5.6)
Mean Plasma Glucose: >398 mg/dL

## 2022-02-01 LAB — OSMOLALITY: Osmolality: 325 mOsm/kg (ref 275–295)

## 2022-02-01 LAB — MAGNESIUM: Magnesium: 2 mg/dL (ref 1.7–2.4)

## 2022-02-01 LAB — PHOSPHORUS: Phosphorus: 3.3 mg/dL (ref 2.5–4.6)

## 2022-02-01 MED ORDER — SODIUM CHLORIDE 0.9 % IV SOLN: INTRAVENOUS | Status: DC

## 2022-02-01 MED ORDER — INSULIN GLARGINE-YFGN 100 UNIT/ML ~~LOC~~ SOLN
35.0000 [IU] | Freq: Every day | SUBCUTANEOUS | Status: DC
  Administered 2022-02-01: 35 [IU] via SUBCUTANEOUS
  Filled 2022-02-01 (×3): qty 0.35

## 2022-02-01 MED ORDER — ENOXAPARIN SODIUM 30 MG/0.3ML IJ SOSY
30.0000 mg | PREFILLED_SYRINGE | INTRAMUSCULAR | Status: DC
  Administered 2022-02-02: 30 mg via SUBCUTANEOUS
  Filled 2022-02-01: qty 0.3

## 2022-02-01 NOTE — Progress Notes (Signed)
PROGRESS NOTE    Megan Mullins  ZOX:[id number] DOB: 04-08-1970 DOA: 01/31/2022 PCP: Pcp, No   Brief Narrative:  HPI: Megan Mullins is a 52 y.o. female with medical history significant of hypertension, type 2 diabetes mellitus who presents to the emergency department due to elevated blood glucose level.  Patient states that she has been compliant with her insulin, though she was not sure of the type of insulin she takes, patient also does not check her blood glucose level at home.  Patient states that on waking up during the night, she had difficulty in being able to see and that she has had similar symptoms in the past when her blood glucose was elevated.  She activated EMS, and on arrival of EMS team, blood glucose was checked and was noted to be high.  She complained of headache, blurry vision, dry mouth and endorsed nonbloody vomiting x 1.  Patient endorsed use of cocaine. She denies diarrhea, abdominal pain, burning sensation with urination.   ED Course:  In the emergency department, patient was tachycardic and intermittently tachypneic.  BP was 186/134, other vital signs are within normal range.  Workup in the ED showed normal CBC except for hemoglobin of 16.2, hematocrit 47.0 and platelets of 402.  BMP showed sodium of 128 potassium 4.1, chloride 87, bicarb 25, blood glucose of 742, BUN/creatinine 21/1.67 (baseline creatinine at 1.2-1.4), EGFR 37, anion gap 16.  Beta hydroxybutyrate acid 1.34, urinalysis was positive for glycosuria and proteinuria.  Pregnancy test was negative. Patient was started on insulin drip per Endo tool, IV hydration was provided.  Hospitalist was asked to admit patient for further evaluation and management.  Assessment & Plan:   Principal Problem:   Hyperosmolar hyperglycemic state (HHS) (HCC) Active Problems:   Essential hypertension   Tobacco abuse   Acute kidney injury superimposed on chronic kidney disease (HCC)   Pseudohyponatremia    Thrombocytosis  Hyperosmolar hyperglycemic state:  Hyperglycemia secondary to poorly controlled type 2 diabetes mellitus.  HHS resolved yesterday and patient was transitioned to long-acting insulin along with SSI, still hyperglycemic, will increase Semglee from 20 units to 35 units and continue SSI.   Pseudohyponatremia: Improving.  Will try to work on hyperglycemia.   Acute kidney injury on CKD 3B/dehydration: Baseline creatinine appears to be around 1.4-1.5 but she presented with BUN/creatinine 21/1.67 which has climbed further to 2.5 today.  I have started her on IV fluids, monitor overnight, repeat labs in the morning.   Chronic thrombocytosis Platelets 402, continue to monitor platelet levels with morning labs   History of essential hypertension but admitted with hypertensive urgency: Her systolic blood pressure went as high as 190 and diastolic as high as 134.  Blood pressure now very well-controlled. Continue metoprolol and hydralazine.   Tobacco abuse: Patient was counseled on tobacco abuse cessation  DVT prophylaxis: enoxaparin (LOVENOX) injection 30 mg Start: 02/02/22 0800 SCDs Start: 01/31/22 8119   Code Status: Full Code  Family Communication:  None present at bedside.  Plan of care discussed with patient in length and he/she verbalized understanding and agreed with it.  Status is: Inpatient Remains inpatient appropriate because: AKI on CKD, needs IV fluids.   Estimated body mass index is 31.74 kg/m as calculated from the following:   Height as of this encounter: 5\' 1"  (1.549 m).   Weight as of this encounter: 76.2 kg.    Nutritional Assessment: Body mass index is 31.74 kg/m. Seen by dietician.  I agree with the assessment and  plan as outlined below: Nutrition Status:        . Skin Assessment: I have examined the patient's skin and I agree with the wound assessment as performed by the wound care RN as outlined below:    Consultants:  None  Procedures:   None  Antimicrobials:  Anti-infectives (From admission, onward)    None         Subjective: Patient seen and examined, she has no complaints.  She is fully alert and oriented.  Objective: Vitals:   01/31/22 1844 01/31/22 2110 02/01/22 0120 02/01/22 0352  BP: 106/72 109/72 115/75 115/78  Pulse: 87 67 66 (!) 54  Resp: 17 20 19 18   Temp: 98.2 F (36.8 C) 98.5 F (36.9 C) 98.4 F (36.9 C) 98.6 F (37 C)  TempSrc: Oral Oral Oral   SpO2: 97% 98% 97% 100%  Weight:      Height:        Intake/Output Summary (Last 24 hours) at 02/01/2022 1104 Last data filed at 02/01/2022 0900 Gross per 24 hour  Intake 480 ml  Output --  Net 480 ml   Filed Weights   01/31/22 0156  Weight: 76.2 kg    Examination:  General exam: Appears calm and comfortable  Respiratory system: Clear to auscultation. Respiratory effort normal. Cardiovascular system: S1 & S2 heard, RRR. No JVD, murmurs, rubs, gallops or clicks. No pedal edema. Gastrointestinal system: Abdomen is nondistended, soft and nontender. No organomegaly or masses felt. Normal bowel sounds heard. Central nervous system: Alert and oriented. No focal neurological deficits. Extremities: Symmetric 5 x 5 power. Skin: No rashes, lesions or ulcers Psychiatry: Judgement and insight appear normal. Mood & affect appropriate.    Data Reviewed: I have personally reviewed following labs and imaging studies  CBC: Recent Labs  Lab 01/31/22 0216 02/01/22 0343  WBC 9.3 9.0  HGB 16.2* 14.1  HCT 47.0* 42.7  MCV 92.0 95.5  PLT 402* 106   Basic Metabolic Panel: Recent Labs  Lab 01/31/22 0552 01/31/22 1009 01/31/22 1502 02/01/22 0343 02/01/22 0837  NA 134* 137 137 135 132*  K 4.1 3.6 3.4* 3.9 4.1  CL 95* 97* 101 99 98  CO2 22 27 27 27 25   GLUCOSE 480* 204* 158* 419* 443*  BUN 19 19 18  28* 29*  CREATININE 1.55* 1.45* 1.40* 2.58* 2.53*  CALCIUM 9.5 9.4 9.1 9.0 8.6*  MG  --   --   --  2.0  --   PHOS  --   --   --  3.3  --     GFR: Estimated Creatinine Clearance: 25.1 mL/min (A) (by C-G formula based on SCr of 2.53 mg/dL (H)). Liver Function Tests: Recent Labs  Lab 02/01/22 0343  AST 16  ALT 13  ALKPHOS 81  BILITOT 0.4  PROT 6.9  ALBUMIN 2.7*   No results for input(s): "LIPASE", "AMYLASE" in the last 168 hours. No results for input(s): "AMMONIA" in the last 168 hours. Coagulation Profile: No results for input(s): "INR", "PROTIME" in the last 168 hours. Cardiac Enzymes: No results for input(s): "CKTOTAL", "CKMB", "CKMBINDEX", "TROPONINI" in the last 168 hours. BNP (last 3 results) No results for input(s): "PROBNP" in the last 8760 hours. HbA1C: Recent Labs    01/31/22 0916  HGBA1C >15.5*   CBG: Recent Labs  Lab 01/31/22 1404 01/31/22 1513 01/31/22 1710 01/31/22 2056 02/01/22 0736  GLUCAP 171* 156* 163* 218* 392*   Lipid Profile: No results for input(s): "CHOL", "HDL", "LDLCALC", "TRIG", "CHOLHDL", "LDLDIRECT" in  the last 72 hours. Thyroid Function Tests: No results for input(s): "TSH", "T4TOTAL", "FREET4", "T3FREE", "THYROIDAB" in the last 72 hours. Anemia Panel: No results for input(s): "VITAMINB12", "FOLATE", "FERRITIN", "TIBC", "IRON", "RETICCTPCT" in the last 72 hours. Sepsis Labs: No results for input(s): "PROCALCITON", "LATICACIDVEN" in the last 168 hours.  No results found for this or any previous visit (from the past 240 hour(s)).   Radiology Studies: No results found.  Scheduled Meds:  acetaminophen  650 mg Oral Q6H   Or   acetaminophen  650 mg Rectal Q6H   [START ON 02/02/2022] enoxaparin (LOVENOX) injection  30 mg Subcutaneous Q24H   hydrALAZINE  50 mg Oral Q8H   insulin aspart  0-15 Units Subcutaneous TID WC   insulin aspart  0-5 Units Subcutaneous QHS   insulin glargine-yfgn  35 Units Subcutaneous Daily   metoprolol tartrate  100 mg Oral BID   Continuous Infusions:  sodium chloride 125 mL/hr at 02/01/22 0723   lactated ringers Stopped (02/01/22 0720)      LOS: 1 day   Darliss Cheney, MD [facility]  02/01/2022, 11:04 AM   *Please note that this is a verbal dictation therefore any spelling or grammatical errors are due to the "[place]" system interpretation.  Please page via [place] and do not message via secure chat for urgent patient care matters. Secure chat can be used for non urgent patient care matters.  How to contact the [facility], [facility], [facility] Attending or Consulting provider [place] or covering provider during after hours [place], for this patient?  Check the care team in [facility] and look for a) attending/consulting TRH provider listed and b) the [facility] team listed. Page or secure chat [phone]. Log into [email or web address] and use [place]'s universal password to access. If you do not have the password, please contact the hospital operator. Locate the [facility] provider you are looking for under [facility] and page to a number that you can be directly reached. If you still have difficulty reaching the provider, please page the [facility] (Director on Call) for the Hospitalists listed on amion for assistance.

## 2022-02-02 LAB — BASIC METABOLIC PANEL
Anion gap: 8 (ref 5–15)
BUN: 32 mg/dL — ABNORMAL HIGH (ref 6–20)
CO2: 22 mmol/L (ref 22–32)
Calcium: 7.7 mg/dL — ABNORMAL LOW (ref 8.9–10.3)
Chloride: 101 mmol/L (ref 98–111)
Creatinine, Ser: 1.95 mg/dL — ABNORMAL HIGH (ref 0.44–1.00)
GFR, Estimated: 31 mL/min — ABNORMAL LOW (ref >60)
Glucose, Bld: 412 mg/dL — ABNORMAL HIGH (ref 70–99)
Potassium: 4.1 mmol/L (ref 3.5–5.1)
Sodium: 131 mmol/L — ABNORMAL LOW (ref 135–145)

## 2022-02-02 LAB — GLUCOSE, CAPILLARY
Glucose-Capillary: 397 mg/dL — ABNORMAL HIGH (ref 70–99)
Glucose-Capillary: 354 mg/dL — ABNORMAL HIGH (ref 70–99)
Glucose-Capillary: 170 mg/dL — ABNORMAL HIGH (ref 70–99)
Glucose-Capillary: 76 mg/dL (ref 70–99)

## 2022-02-02 MED ORDER — INSULIN GLARGINE-YFGN 100 UNIT/ML ~~LOC~~ SOLN
50.0000 [IU] | Freq: Every day | SUBCUTANEOUS | Status: DC
  Administered 2022-02-02 – 2022-02-03 (×2): 50 [IU] via SUBCUTANEOUS
  Filled 2022-02-02 (×3): qty 0.5

## 2022-02-02 MED ORDER — INSULIN ASPART 100 UNIT/ML IJ SOLN
4.0000 [IU] | Freq: Three times a day (TID) | INTRAMUSCULAR | Status: DC
  Administered 2022-02-02 – 2022-02-03 (×3): 4 [IU] via SUBCUTANEOUS

## 2022-02-02 NOTE — Inpatient Diabetes Management (Signed)
Inpatient Diabetes Program Recommendations  AACE/ADA: New Consensus Statement on Inpatient Glycemic Control   Target Ranges:  Prepandial:   less than 140 mg/dL      Peak postprandial:   less than 180 mg/dL (1-2 hours)      Critically ill patients:  140 - 180 mg/dL    Latest Reference Range & Units 02/01/22 07:36 02/01/22 11:05 02/01/22 16:38 02/01/22 21:13 02/02/22 07:28  Glucose-Capillary 70 - 99 mg/dL 392 (H) 426 (H) 84 393 (H) 397 (H)    Latest Reference Range & Units 01/31/22 09:16  Hemoglobin A1C 4.8 - 5.6 % >15.5 (H)   Review of Glycemic Control  Diabetes history: DM2 Outpatient Diabetes medications: Lantus 10 units QHS, Metformin 1000 mg BID Current orders for Inpatient glycemic control: Semglee 50 units daily, Novolog 0-15 units TID with meals, Novolog 0-5 units QHS   Inpatient Diabetes Program Recommendations:     Insulin: Noted Semglee increased from 35 units daily to 50 units daily today. If post prandial glucose remains consistently over 180 mg/dl with increase in Semglee, please consider ordering Novolog 6 units TID with meals for meal coverage if patient eats at least 50% of meals.   HbgA1C: A1C >15.5% on 01/31/22 indicating an average glucose over 398 mg/dl over the past 2-3 months. Anticipate patient will need increased basal insulin as well as meal coverage insulin outpatient.   Thanks, Barnie Alderman, RN, MSN, [place] Diabetes Coordinator Inpatient Diabetes Program [phone] (Team Pager from 8am to [place])

## 2022-02-02 NOTE — Progress Notes (Signed)
Tele called regarding patient having wearing tele monitor, due to patient requesting for tele to be removed. MD Pahwani made aware.

## 2022-02-02 NOTE — Progress Notes (Signed)
Went in to fix pt's leads, pt refused and requested tele be removed because she is "tired of [place] coming in all the time just to fix the leads" She does move around a lot and they don't stick well, no matter what we try. MD and central telemetry aware.

## 2022-02-02 NOTE — Progress Notes (Signed)
PROGRESS NOTE    Megan Mullins  ZOX:[id number] DOB: April 17, 1970 DOA: 01/31/2022 PCP: Pcp, No   Brief Narrative:  HPI: Megan Mullins is a 52 y.o. female with medical history significant of hypertension, type 2 diabetes mellitus who presents to the emergency department due to elevated blood glucose level.  Patient states that she has been compliant with her insulin, though she was not sure of the type of insulin she takes, patient also does not check her blood glucose level at home.  Patient states that on waking up during the night, she had difficulty in being able to see and that she has had similar symptoms in the past when her blood glucose was elevated.  She activated EMS, and on arrival of EMS team, blood glucose was checked and was noted to be high.  She complained of headache, blurry vision, dry mouth and endorsed nonbloody vomiting x 1.  Patient endorsed use of cocaine. She denies diarrhea, abdominal pain, burning sensation with urination.   ED Course:  In the emergency department, patient was tachycardic and intermittently tachypneic.  BP was 186/134, other vital signs are within normal range.  Workup in the ED showed normal CBC except for hemoglobin of 16.2, hematocrit 47.0 and platelets of 402.  BMP showed sodium of 128 potassium 4.1, chloride 87, bicarb 25, blood glucose of 742, BUN/creatinine 21/1.67 (baseline creatinine at 1.2-1.4), EGFR 37, anion gap 16.  Beta hydroxybutyrate acid 1.34, urinalysis was positive for glycosuria and proteinuria.  Pregnancy test was negative. Patient was started on insulin drip per Endo tool, IV hydration was provided.  Hospitalist was asked to admit patient for further evaluation and management.  Assessment & Plan:   Principal Problem:   Hyperosmolar hyperglycemic state (HHS) ([place]) Active Problems:   Essential hypertension   Tobacco abuse   Acute kidney injury superimposed on chronic kidney disease (HCC)   Pseudohyponatremia    Thrombocytosis  Hyperosmolar hyperglycemic state:  Hyperglycemia secondary to poorly controlled type 2 diabetes mellitus.  HHS resolved 01/31/2022 and patient was transitioned to long-acting insulin along with SSI, still hyperglycemic, will increase Semglee to 50 units, continue SSI and add Premeal 4 units NovoLog 3 times daily.   Pseudohyponatremia: Improving.  Will try to work on hyperglycemia.   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  Will need further IV fluids, continue those and repeat labs in the morning.   Chronic thrombocytosis Platelets 402, continue to monitor platelet levels with morning labs   History of essential hypertension but admitted with hypertensive urgency: Her systolic blood pressure went as high as 811 and diastolic as high as 914.  Blood pressure now very well-controlled. Continue metoprolol and hydralazine.   Tobacco abuse: Patient was counseled on tobacco abuse cessation  DVT prophylaxis: enoxaparin (LOVENOX) injection 30 mg Start: 02/02/22 0800 SCDs Start: 01/31/22 7829   Code Status: Full Code  Family Communication:  None present at bedside.  Plan of care discussed with patient in length and he/she verbalized understanding and agreed with it.  Status is: Inpatient Remains inpatient appropriate because: AKI on CKD, needs IV fluids.   Estimated body mass index is 31.74 kg/m as calculated from the following:   Height as of this encounter: 5\' 1"  (1.549 m).   Weight as of this encounter: 76.2 kg.    Nutritional Assessment: Body mass index is 31.74 kg/m.Marland Kitchen Seen  by dietician.  I agree with the assessment and plan as outlined below: Nutrition Status:        . Skin Assessment: I have examined the patient's skin and I agree with the wound assessment as performed by the wound care RN as  outlined below:    Consultants:  None  Procedures:  None  Antimicrobials:  Anti-infectives (From admission, onward)    None         Subjective:  Patient seen and examined.  She has no complaints.  Objective: Vitals:   02/01/22 1325 02/01/22 2014 02/02/22 0423 02/02/22 0855  BP: 104/76 100/70 107/79 (!) 135/93  Pulse: 63 73 (!) 57 64  Resp:  14 18   Temp:  97.9 F (36.6 C) 98.7 F (37.1 C)   TempSrc:  Oral    SpO2: 100% 100% 100%   Weight:      Height:        Intake/Output Summary (Last 24 hours) at 02/02/2022 1008 Last data filed at 02/02/2022 0656 Gross per 24 hour  Intake 1862.78 ml  Output --  Net 1862.78 ml    Filed Weights   01/31/22 0156  Weight: 76.2 kg    Examination:  General exam: Appears calm and comfortable  Respiratory system: Clear to auscultation. Respiratory effort normal. Cardiovascular system: S1 & S2 heard, RRR. No JVD, murmurs, rubs, gallops or clicks. No pedal edema. Gastrointestinal system: Abdomen is nondistended, soft and nontender. No organomegaly or masses felt. Normal bowel sounds heard. Central nervous system: Alert and oriented. No focal neurological deficits. Extremities: Symmetric 5 x 5 power. Skin: No rashes, lesions or ulcers.  Psychiatry: Judgement and insight appear normal. Mood & affect appropriate.   Data Reviewed: I have personally reviewed following labs and imaging studies  CBC: Recent Labs  Lab 01/31/22 0216 02/01/22 0343  WBC 9.3 9.0  HGB 16.2* 14.1  HCT 47.0* 42.7  MCV 92.0 95.5  PLT 402* 377    Basic Metabolic Panel: Recent Labs  Lab 01/31/22 1009 01/31/22 1502 02/01/22 0343 02/01/22 0837 02/02/22 0644  NA 137 137 135 132* 131*  K 3.6 3.4* 3.9 4.1 4.1  CL 97* 101 99 98 101  CO2 27 27 27 25 22   GLUCOSE 204* 158* 419* 443* 412*  BUN 19 18 28* 29* 32*  CREATININE 1.45* 1.40* 2.58* 2.53* 1.95*  CALCIUM 9.4 9.1 9.0 8.6* 7.7*  MG  --   --  2.0  --   --   PHOS  --   --  3.3  --   --      GFR: Estimated Creatinine Clearance: 32.6 mL/min (A) (by C-G formula based on SCr of 1.95 mg/dL (H)). Liver Function Tests: Recent Labs  Lab 02/01/22 0343  AST 16  ALT 13  ALKPHOS 81  BILITOT 0.4  PROT 6.9  ALBUMIN 2.7*    No results for input(s): "LIPASE", "AMYLASE" in the last 168 hours. No results for input(s): "AMMONIA" in the last 168 hours. Coagulation Profile: No results for input(s): "INR", "PROTIME" in the last 168 hours. Cardiac Enzymes: No results for input(s): "CKTOTAL", "CKMB", "CKMBINDEX", "TROPONINI" in the last 168 hours. BNP (last 3 results) No results for input(s): "PROBNP" in the last 8760 hours. HbA1C: Recent Labs    01/31/22 0916  HGBA1C >15.5*    CBG: Recent Labs  Lab 02/01/22 0736 02/01/22 1105 02/01/22 1638 02/01/22 2113 02/02/22 0728  GLUCAP 392* 426* 84 393* 397*    Lipid Profile: No results for input(s): "CHOL", "HDL", "  [place]", "TRIG", "CHOLHDL", "LDLDIRECT" in the last 72 hours. Thyroid Function Tests: No results for input(s): "TSH", "T4TOTAL", "FREET4", "T3FREE", "THYROIDAB" in the last 72 hours. Anemia Panel: No results for input(s): "VITAMINB12", "FOLATE", "FERRITIN", "TIBC", "IRON", "RETICCTPCT" in the last 72 hours. Sepsis Labs: No results for input(s): "PROCALCITON", "LATICACIDVEN" in the last 168 hours.  No results found for this or any previous visit (from the past 240 hour(s)).   Radiology Studies: No results found.  Scheduled Meds:  acetaminophen  650 mg Oral Q6H   Or   acetaminophen  650 mg Rectal Q6H   enoxaparin (LOVENOX) injection  30 mg Subcutaneous Q24H   hydrALAZINE  50 mg Oral Q8H   insulin aspart  0-15 Units Subcutaneous TID WC   insulin aspart  0-5 Units Subcutaneous QHS   insulin aspart  4 Units Subcutaneous TID WC   insulin glargine-yfgn  50 Units Subcutaneous Daily   metoprolol tartrate  100 mg Oral BID   Continuous Infusions:  sodium chloride 125 mL/hr at 02/01/22 1727     LOS: 2 days    Darliss Cheney, MD [facility]  02/02/2022, 10:08 AM   *Please note that this is a verbal dictation therefore any spelling or grammatical errors are due to the "[employer]" system interpretation.  Please page via [place] and do not message via secure chat for urgent patient care matters. Secure chat can be used for non urgent patient care matters.  How to contact the [facility] Attending or Consulting provider [place] or covering provider during after hours [place], for this patient?  Check the care team in [facility] and look for a) attending/consulting [facility] provider listed and b) the [facility] team listed. Page or secure chat [phone]. Log into [email or web address] and use [place]'s universal password to access. If you do not have the password, please contact the hospital operator. Locate the [facility] provider you are looking for under [facility] and page to a number that you can be directly reached. If you still have difficulty reaching the provider, please page the [facility] (Director on Call) for the Hospitalists listed on amion for assistance.

## 2022-02-03 LAB — BASIC METABOLIC PANEL
Anion gap: 5 (ref 5–15)
BUN: 27 mg/dL — ABNORMAL HIGH (ref 6–20)
CO2: 23 mmol/L (ref 22–32)
Calcium: 7.9 mg/dL — ABNORMAL LOW (ref 8.9–10.3)
Chloride: 110 mmol/L (ref 98–111)
Creatinine, Ser: 1.57 mg/dL — ABNORMAL HIGH (ref 0.44–1.00)
GFR, Estimated: 40 mL/min — ABNORMAL LOW (ref >60)
Glucose, Bld: 111 mg/dL — ABNORMAL HIGH (ref 70–99)
Potassium: 4 mmol/L (ref 3.5–5.1)
Sodium: 138 mmol/L (ref 135–145)

## 2022-02-03 MED ORDER — INSULIN ASPART 100 UNIT/ML IJ SOLN: 0.0000 [IU] | Freq: Three times a day (TID) | INTRAMUSCULAR | 11 refills | Status: DC

## 2022-02-03 MED ORDER — LANTUS SOLOSTAR 100 UNIT/ML ~~LOC~~ SOPN: 25.0000 [IU] | PEN_INJECTOR | Freq: Two times a day (BID) | SUBCUTANEOUS | 0 refills | Status: DC

## 2022-02-03 MED ORDER — ENOXAPARIN SODIUM 40 MG/0.4ML IJ SOSY
40.0000 mg | PREFILLED_SYRINGE | INTRAMUSCULAR | Status: DC
  Administered 2022-02-03: 40 mg via SUBCUTANEOUS
  Filled 2022-02-03: qty 0.4

## 2022-02-03 MED ORDER — METFORMIN HCL 1000 MG PO TABS: 1000.0000 mg | ORAL_TABLET | Freq: Two times a day (BID) | ORAL | 0 refills | Status: DC

## 2022-02-03 MED ORDER — HYDRALAZINE HCL 50 MG PO TABS: 50.0000 mg | ORAL_TABLET | Freq: Three times a day (TID) | ORAL | 0 refills | Status: DC

## 2022-02-03 MED ORDER — METOPROLOL TARTRATE 100 MG PO TABS: 100.0000 mg | ORAL_TABLET | Freq: Two times a day (BID) | ORAL | 2 refills | Status: DC

## 2022-02-03 NOTE — Discharge Summary (Signed)
Physician Discharge Summary  Megan Mullins BTD:[id number] DOB: 10-16-1970 DOA: 01/31/2022  PCP: Pcp, No  Admit date: 01/31/2022 Discharge date: 02/03/2022 30 Day Unplanned Readmission Risk Score    Flowsheet Row ED to Hosp-Admission (Current) from 01/31/2022 in [facility]  30 Day Unplanned Readmission Risk Score (%) 16.59 Filed at 02/03/2022 0801       This score is the patient's risk of an unplanned readmission within 30 days of being discharged (0 -100%). The score is based on dignosis, age, lab data, medications, orders, and past utilization.   Low:  0-14.9   Medium: 15-21.9   High: 22-29.9   Extreme: 30 and above          Admitted From: Home Disposition: Home  Recommendations for Outpatient Follow-up:  Follow up with PCP in 1-2 weeks Please obtain BMP/CBC in one week Please follow up with your PCP on the following pending results: Unresulted Labs (From admission, onward)     Start     Ordered   02/07/22 0500  Creatinine, serum  (enoxaparin (LOVENOX)    CrCl >/= 30 ml/min)  Weekly,   R     Comments: while on enoxaparin therapy    01/31/22 0620   01/31/22 1009  Resp panel by RT-PCR (RSV, Flu A&B, Covid) Anterior Nasal Swab  Once,   R        01/31/22 1008              Home Health: None Equipment/Devices: None  Discharge Condition: Stable CODE STATUS: Full code Diet recommendation: Diabetic  Subjective: Seen and, no complaints, she is feeling well.  She feels ready for discharge.  Brief/Interim Summary: Megan Mullins is a 52 y.o. female with medical history significant of hypertension, type 2 diabetes mellitus who presented to the emergency department due to elevated blood glucose level.  Patient states that she has been compliant with her insulin, though she was not sure of the type of insulin she takes, patient also does not check her blood glucose level at home.  Patient states that on waking up during the night, she had difficulty in being  able to see and that she has had similar symptoms in the past when her blood glucose was elevated.  She activated EMS, and on arrival of EMS team, blood glucose was checked and was noted to be high.  She complained of headache, blurry vision, dry mouth and endorsed nonbloody vomiting x 1.  Patient endorsed use of cocaine. She denies diarrhea, abdominal pain, burning sensation with urination.   ED Course:  In the emergency department, patient was tachycardic and intermittently tachypneic.  BP was 186/134, other vital signs are within normal range.  Workup in the ED showed normal CBC except for hemoglobin of 16.2, hematocrit 47.0 and platelets of 402.  BMP showed sodium of 128 potassium 4.1, chloride 87, bicarb 25, blood glucose of 742, BUN/creatinine 21/1.67 (baseline creatinine at 1.2-1.4), EGFR 37, anion gap 16.  Beta hydroxybutyrate acid 1.34, urinalysis was positive for glycosuria and proteinuria.  Pregnancy test was negative. Patient was started on insulin drip per Endo tool, IV hydration was provided.  Hospitalist was asked to admit patient for further evaluation and management.    Hyperosmolar hyperglycemic state:  Hyperglycemia secondary to poorly controlled type 2 diabetes mellitus.  HHS resolved 01/31/2022 and patient was transitioned to long-acting insulin along with SSI, still hyperglycemic, so her Semglee to 50 units, and she was continued on SSI.  Previously she was  taking Lantus 10 units at home.  Her hemoglobin A1c was found to be > 15.5 indicating poorly controlled type 2 diabetes mellitus likely secondary to lack of education and noncompliance.  At this time based on our observation, I am discharging her on Lantus 25 units twice daily along with SSI.  She has been educated about her diet and regimen by diabetes coordinator.   Pseudohyponatremia: Resolved.   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   Chronic thrombocytosis Platelets 402, continue to monitor platelet levels with morning labs   History of essential hypertension but admitted with hypertensive urgency: Her systolic blood pressure went as high as 190 and diastolic as high as 134.  Although patient claimed that she was taking her metoprolol but there were no records of pharmacy feels but this medication was resumed here and blood pressure was still elevated so hydralazine was added.  Currently she is being discharged on metoprolol and hydralazine and blood pressure is very well-controlled on this regimen.   Tobacco abuse: Patient was counseled on tobacco abuse cessation  Discharge plan was discussed with patient and/or family member and they verbalized understanding and agreed with it.  Discharge Diagnoses:  Principal Problem:   Hyperosmolar hyperglycemic state (HHS) ([facility]) Active Problems:   Essential hypertension   Tobacco abuse   Acute kidney injury superimposed on chronic kidney disease (HCC)   Pseudohyponatremia   Thrombocytosis    Discharge Instructions   Allergies as of 02/03/2022   No Known Allergies      Medication List     TAKE these medications    hydrALAZINE 50 MG tablet Commonly known as: APRESOLINE Take 1 tablet (50 mg total) by mouth every 8 (eight) hours.   insulin aspart 100 UNIT/ML injection Commonly known as: novoLOG Inject 0-15 Units into the skin 3 (three) times daily with meals. CBG 70 - 120: 0 units CBG 121 - 150: 0 units CBG 151 - 200: 0 units CBG 201 - 250: 2 units CBG 251 - 300: 3 units CBG 301 - 350: 4 units CBG 351 - 400: 5 units   Lantus SoloStar 100 UNIT/ML Solostar Pen Generic drug: insulin glargine Inject 25 Units into the skin 2 (two) times daily. What changed:  how much to take when to take this   metFORMIN 1000 MG tablet Commonly known as: GLUCOPHAGE Take 1 tablet (1,000  mg total) by mouth 2 (two) times daily with a meal.   metoprolol tartrate 100 MG tablet Commonly known as: LOPRESSOR Take 1 tablet (100 mg total) by mouth 2 (two) times daily.        Follow-up Information     [facility] Follow up.   Why: To establish primary care provider. Contact information: [phone]        pcp Follow up in 1 week(s).                 No Known Allergies  Consultations: None   Procedures/Studies: No results found.   Discharge Exam: Vitals:   02/03/22 0500 02/03/22 0515  BP: 117/78 137/88  Pulse:  70  Resp:  12  Temp:  97.8 F (36.6 C)  SpO2:  100%   Vitals:   02/02/22 1437 02/02/22 2034 02/03/22 0500 02/03/22 0515  BP: (!) 141/94 (!) 132/93 117/78 137/88  Pulse:  71  70  Resp:  14  12  Temp:  97.6 F (36.4 C)  97.8 F (36.6 C)  TempSrc:  Oral  Oral  SpO2:  100%  100%  Weight:      Height:        General: Pt is alert, awake, not in acute distress Cardiovascular: RRR, S1/S2 +, no rubs, no gallops Respiratory: CTA bilaterally, no wheezing, no rhonchi Abdominal: Soft, NT, ND, bowel sounds + Extremities: no edema, no cyanosis    The results of significant diagnostics from this hospitalization (including imaging, microbiology, ancillary and laboratory) are listed below for reference.     Microbiology: No results found for this or any previous visit (from the past 240 hour(s)).   Labs: BNP (last 3 results) No results for input(s): "BNP" in the last 8760 hours. Basic Metabolic Panel: Recent Labs  Lab 01/31/22 1502 02/01/22 0343 02/01/22 0837 02/02/22 0644 02/03/22 0335  NA 137 135 132* 131* 138  K 3.4* 3.9 4.1 4.1 4.0  CL 101 99 98 101 110  CO2 27 27 25 22 23   GLUCOSE 158* 419* 443* 412* 111*  BUN 18 28* 29* 32* 27*  CREATININE 1.40* 2.58* 2.53* 1.95* 1.57*  CALCIUM 9.1 9.0 8.6* 7.7* 7.9*  MG  --  2.0  --   --   --   PHOS  --  3.3  --   --   --    Liver Function Tests: Recent Labs  Lab 02/01/22 0343   AST 16  ALT 13  ALKPHOS 81  BILITOT 0.4  PROT 6.9  ALBUMIN 2.7*   No results for input(s): "LIPASE", "AMYLASE" in the last 168 hours. No results for input(s): "AMMONIA" in the last 168 hours. CBC: Recent Labs  Lab 01/31/22 0216 02/01/22 0343  WBC 9.3 9.0  HGB 16.2* 14.1  HCT 47.0* 42.7  MCV 92.0 95.5  PLT 402* 377   Cardiac Enzymes: No results for input(s): "CKTOTAL", "CKMB", "CKMBINDEX", "TROPONINI" in the last 168 hours. BNP: Invalid input(s): "POCBNP" CBG: Recent Labs  Lab 02/01/22 2113 02/02/22 0728 02/02/22 1157 02/02/22 1639 02/02/22 2038  GLUCAP 393* 397* 354* 170* 76   D-Dimer No results for input(s): "DDIMER" in the last 72 hours. Hgb A1c No results for input(s): "HGBA1C" in the last 72 hours. Lipid Profile No results for input(s): "CHOL", "HDL", "LDLCALC", "TRIG", "CHOLHDL", "LDLDIRECT" in the last 72 hours. Thyroid function studies No results for input(s): "TSH", "T4TOTAL", "T3FREE", "THYROIDAB" in the last 72 hours.  Invalid input(s): "FREET3" Anemia work up No results for input(s): "VITAMINB12", "FOLATE", "FERRITIN", "TIBC", "IRON", "RETICCTPCT" in the last 72 hours. Urinalysis    Component Value Date/Time   COLORURINE YELLOW 01/31/2022 0202   APPEARANCEUR CLEAR 01/31/2022 0202   LABSPEC 1.010 01/31/2022 0202   PHURINE 6.5 01/31/2022 0202   GLUCOSEU >=500 (A) 01/31/2022 0202   HGBUR TRACE (A) 01/31/2022 0202   BILIRUBINUR NEGATIVE 01/31/2022 0202   KETONESUR NEGATIVE 01/31/2022 0202   PROTEINUR 100 (A) 01/31/2022 0202   UROBILINOGEN 0.2 03/13/2014 1940   NITRITE NEGATIVE 01/31/2022 0202   LEUKOCYTESUR NEGATIVE 01/31/2022 0202   Sepsis Labs Recent Labs  Lab 01/31/22 0216 02/01/22 0343  WBC 9.3 9.0   Microbiology No results found for this or any previous visit (from the past 240 hour(s)).   Time coordinating discharge: Over 30 minutes  SIGNED:   Darliss Cheney, MD  [facility] 02/03/2022, 9:23 AM *Please note that this  is a verbal dictation therefore any spelling or grammatical errors are due to the "[employer]" system interpretation. If 7PM-7AM, please contact night-coverage [email or web address]

## 2022-03-09 ENCOUNTER — Encounter (HOSPITAL_COMMUNITY): Payer: Self-pay

## 2022-03-09 ENCOUNTER — Ambulatory Visit (HOSPITAL_COMMUNITY): Payer: PRIVATE HEALTH INSURANCE

## 2022-03-09 ENCOUNTER — Other Ambulatory Visit (HOSPITAL_COMMUNITY)
Admission: RE | Admit: 2022-03-09 | Discharge: 2022-03-09 | Disposition: A | Discharge status: Home / Self Care | Payer: PRIVATE HEALTH INSURANCE | Source: Ambulatory Visit | Attending: Internal Medicine | Admitting: Internal Medicine

## 2022-03-09 DIAGNOSIS — Z0001 Encounter for general adult medical examination with abnormal findings: Secondary | ICD-10-CM | POA: Insufficient documentation

## 2022-03-09 LAB — CBC WITH DIFFERENTIAL/PLATELET
Abs Immature Granulocytes: 0.03 10*3/uL (ref 0.00–0.07)
Basophils Absolute: 0 10*3/uL (ref 0.0–0.1)
Basophils Relative: 1 %
Eosinophils Absolute: 0.1 10*3/uL (ref 0.0–0.5)
Eosinophils Relative: 1 %
HCT: 41.9 % (ref 36.0–46.0)
Hemoglobin: 13.9 g/dL (ref 12.0–15.0)
Immature Granulocytes: 1 %
Lymphocytes Relative: 35 %
Lymphs Abs: 2 10*3/uL (ref 0.7–4.0)
MCH: 31.2 pg (ref 26.0–34.0)
MCHC: 33.2 g/dL (ref 30.0–36.0)
MCV: 93.9 fL (ref 80.0–100.0)
Monocytes Absolute: 0.4 10*3/uL (ref 0.1–1.0)
Monocytes Relative: 6 %
Neutro Abs: 3.2 10*3/uL (ref 1.7–7.7)
Neutrophils Relative %: 56 %
Platelets: 418 10*3/uL — ABNORMAL HIGH (ref 150–400)
RBC: 4.46 MIL/uL (ref 3.87–5.11)
RDW: 13.9 % (ref 11.5–15.5)
WBC: 5.7 10*3/uL (ref 4.0–10.5)
nRBC: 0 % (ref 0.0–0.2)

## 2022-03-09 LAB — HEPATIC FUNCTION PANEL
ALT: 16 U/L (ref 0–44)
AST: 19 U/L (ref 15–41)
Albumin: 3.5 g/dL (ref 3.5–5.0)
Alkaline Phosphatase: 88 U/L (ref 38–126)
Bilirubin, Direct: 0.1 mg/dL (ref 0.0–0.2)
Indirect Bilirubin: 0.4 mg/dL (ref 0.3–0.9)
Total Bilirubin: 0.5 mg/dL (ref 0.3–1.2)
Total Protein: 7.7 g/dL (ref 6.5–8.1)

## 2022-03-09 LAB — LIPID PANEL
Cholesterol: 194 mg/dL (ref 0–200)
HDL: 64 mg/dL (ref >40)
LDL Cholesterol: 90 mg/dL (ref 0–99)
Total CHOL/HDL Ratio: 3 RATIO
Triglycerides: 200 mg/dL — ABNORMAL HIGH (ref <150)
VLDL: 40 mg/dL (ref 0–40)

## 2022-03-09 LAB — BASIC METABOLIC PANEL
Anion gap: 11 (ref 5–15)
BUN: 27 mg/dL — ABNORMAL HIGH (ref 6–20)
CO2: 25 mmol/L (ref 22–32)
Calcium: 9 mg/dL (ref 8.9–10.3)
Chloride: 97 mmol/L — ABNORMAL LOW (ref 98–111)
Creatinine, Ser: 1.59 mg/dL — ABNORMAL HIGH (ref 0.44–1.00)
GFR, Estimated: 40 mL/min — ABNORMAL LOW (ref >60)
Glucose, Bld: 377 mg/dL — ABNORMAL HIGH (ref 70–99)
Potassium: 4.1 mmol/L (ref 3.5–5.1)
Sodium: 133 mmol/L — ABNORMAL LOW (ref 135–145)

## 2022-03-09 LAB — HEMOGLOBIN A1C
Hgb A1c MFr Bld: 14.3 % — ABNORMAL HIGH (ref 4.8–5.6)
Mean Plasma Glucose: 363.71 mg/dL

## 2022-03-09 LAB — TSH: TSH: 1.799 u[IU]/mL (ref 0.350–4.500)

## 2022-03-09 LAB — T4, FREE: Free T4: 1.2 ng/dL — ABNORMAL HIGH (ref 0.61–1.12)

## 2022-03-10 LAB — MICROALBUMIN / CREATININE URINE RATIO
Creatinine, Urine: 84.6 mg/dL
Microalb Creat Ratio: 1110 mg/g creat — ABNORMAL HIGH (ref 0–29)
Microalb, Ur: 938.7 ug/mL — ABNORMAL HIGH

## 2022-05-09 ENCOUNTER — Telehealth: Payer: Self-pay

## 2022-05-09 NOTE — Telephone Encounter (Signed)
Attempted to call with Ms Kapaun. No answer, left Vm. She has Estate agent, called to assure has PCP.   Francee Nodal RN [employer]

## 2022-05-29 ENCOUNTER — Other Ambulatory Visit (HOSPITAL_COMMUNITY)
Admission: RE | Admit: 2022-05-29 | Discharge: 2022-05-29 | Disposition: A | Discharge status: Home / Self Care | Payer: PRIVATE HEALTH INSURANCE | Source: Ambulatory Visit | Attending: Internal Medicine | Admitting: Internal Medicine

## 2022-05-29 DIAGNOSIS — E1165 Type 2 diabetes mellitus with hyperglycemia: Secondary | ICD-10-CM | POA: Insufficient documentation

## 2022-05-29 LAB — HEMOGLOBIN A1C
Hgb A1c MFr Bld: 8.7 % — ABNORMAL HIGH (ref 4.8–5.6)
Mean Plasma Glucose: 202.99 mg/dL

## 2022-06-26 ENCOUNTER — Other Ambulatory Visit (HOSPITAL_COMMUNITY): Payer: Self-pay | Admitting: Gerontology

## 2022-06-26 ENCOUNTER — Ambulatory Visit (HOSPITAL_COMMUNITY)
Admission: RE | Admit: 2022-06-26 | Discharge: 2022-06-26 | Disposition: A | Discharge status: Home / Self Care | Payer: PRIVATE HEALTH INSURANCE | Source: Ambulatory Visit | Attending: Gerontology | Admitting: Gerontology

## 2022-06-26 DIAGNOSIS — M25552 Pain in left hip: Secondary | ICD-10-CM | POA: Diagnosis present

## 2022-09-29 ENCOUNTER — Encounter (HOSPITAL_COMMUNITY): Payer: Self-pay

## 2022-09-29 ENCOUNTER — Emergency Department (HOSPITAL_COMMUNITY)
Admission: EM | Admit: 2022-09-29 | Discharge: 2022-09-29 | Discharge status: Against Medical Advice | Payer: PRIVATE HEALTH INSURANCE | Attending: Emergency Medicine | Admitting: Emergency Medicine

## 2022-09-29 ENCOUNTER — Other Ambulatory Visit: Payer: Self-pay

## 2022-09-29 DIAGNOSIS — Z5321 Procedure and treatment not carried out due to patient leaving prior to being seen by health care provider: Secondary | ICD-10-CM | POA: Diagnosis not present

## 2022-09-29 DIAGNOSIS — E1165 Type 2 diabetes mellitus with hyperglycemia: Secondary | ICD-10-CM | POA: Diagnosis present

## 2022-09-29 LAB — COMPREHENSIVE METABOLIC PANEL
ALT: 13 U/L (ref 0–44)
AST: 15 U/L (ref 15–41)
Albumin: 3.6 g/dL (ref 3.5–5.0)
Alkaline Phosphatase: 87 U/L (ref 38–126)
Anion gap: 10 (ref 5–15)
BUN: 21 mg/dL — ABNORMAL HIGH (ref 6–20)
CO2: 23 mmol/L (ref 22–32)
Calcium: 9.5 mg/dL (ref 8.9–10.3)
Chloride: 98 mmol/L (ref 98–111)
Creatinine, Ser: 1.56 mg/dL — ABNORMAL HIGH (ref 0.44–1.00)
GFR, Estimated: 41 mL/min — ABNORMAL LOW (ref >60)
Glucose, Bld: 461 mg/dL — ABNORMAL HIGH (ref 70–99)
Potassium: 4.1 mmol/L (ref 3.5–5.1)
Sodium: 131 mmol/L — ABNORMAL LOW (ref 135–145)
Total Bilirubin: 0.4 mg/dL (ref 0.3–1.2)
Total Protein: 7.5 g/dL (ref 6.5–8.1)

## 2022-09-29 LAB — CBG MONITORING, ED: Glucose-Capillary: 446 mg/dL — ABNORMAL HIGH (ref 70–99)

## 2022-09-29 LAB — CBC
HCT: 40.9 % (ref 36.0–46.0)
Hemoglobin: 13.6 g/dL (ref 12.0–15.0)
MCH: 31.1 pg (ref 26.0–34.0)
MCHC: 33.3 g/dL (ref 30.0–36.0)
MCV: 93.4 fL (ref 80.0–100.0)
Platelets: 406 10*3/uL — ABNORMAL HIGH (ref 150–400)
RBC: 4.38 MIL/uL (ref 3.87–5.11)
RDW: 13.5 % (ref 11.5–15.5)
WBC: 5.8 10*3/uL (ref 4.0–10.5)
nRBC: 0 % (ref 0.0–0.2)

## 2022-09-29 NOTE — ED Notes (Signed)
Called pt for second time from waiting area, patient not present in waiting area, will attempt one more time.

## 2022-09-29 NOTE — ED Notes (Signed)
Checked on pt in lobby for UA Pt unable to provide sample at this time

## 2022-09-29 NOTE — ED Provider Triage Note (Signed)
Emergency Medicine Provider Triage Evaluation Note  Megan Mullins , a 52 y.o. female  was evaluated in triage.  Pt complains of hypoglycemia.  Patient reports history of type 2 diabetes and was seen by her primary care provider office earlier today and was advised to come to the emergency department concerns of glucose being above 500.  Patient reports she takes Lantus and metformin for glucose control and has not missed any doses or medications.  Denies any recent dietary changes.  Currently denies any nausea, vomiting, abdominal pain, dysuria, increased urinary frequency or urgency.  Denies polyuria, polydipsia, or polyphagia.  Review of Systems  Positive: As above Negative: As above  Physical Exam  BP (!) 171/116 (BP Location: Right Arm)   Pulse 96   Temp 98.8 F (37.1 C) (Oral)   Resp 18   Wt 82.1 kg   SpO2 100%   BMI 34.20 kg/m  Gen:   Awake, no distress   Resp:  Normal effort  MSK:   Moves extremities without difficulty  Other:   Medical Decision Making  Medically screening exam initiated at 2:16 PM.  Appropriate orders placed.  Megan Mullins was informed that the remainder of the evaluation will be completed by another provider, this initial triage assessment does not replace that evaluation, and the importance of remaining in the ED until their evaluation is complete.     Smitty Knudsen, PA-C 09/29/22 1417

## 2022-09-29 NOTE — ED Triage Notes (Signed)
C/o hyperglycemia with [phone] at MD office this am.  Pt reports compliant with insulin however doesn't check cbg at home.  Denies all s/sy.  Hx of DM

## 2022-09-29 NOTE — ED Notes (Signed)
Attempted again pt not present

## 2022-11-20 ENCOUNTER — Encounter (HOSPITAL_COMMUNITY): Payer: Self-pay

## 2022-11-20 ENCOUNTER — Other Ambulatory Visit: Payer: Self-pay

## 2022-11-20 ENCOUNTER — Observation Stay (HOSPITAL_COMMUNITY)
Admission: EM | Admit: 2022-11-20 | Discharge: 2022-11-21 | Disposition: A | Discharge status: Home / Self Care | Payer: No Typology Code available for payment source | Attending: Internal Medicine | Admitting: Internal Medicine

## 2022-11-20 ENCOUNTER — Emergency Department (HOSPITAL_COMMUNITY): Payer: No Typology Code available for payment source

## 2022-11-20 DIAGNOSIS — Z79899 Other long term (current) drug therapy: Secondary | ICD-10-CM | POA: Insufficient documentation

## 2022-11-20 DIAGNOSIS — I129 Hypertensive chronic kidney disease with stage 1 through stage 4 chronic kidney disease, or unspecified chronic kidney disease: Secondary | ICD-10-CM | POA: Diagnosis not present

## 2022-11-20 DIAGNOSIS — Z794 Long term (current) use of insulin: Secondary | ICD-10-CM | POA: Diagnosis not present

## 2022-11-20 DIAGNOSIS — E1122 Type 2 diabetes mellitus with diabetic chronic kidney disease: Secondary | ICD-10-CM | POA: Diagnosis not present

## 2022-11-20 DIAGNOSIS — Z87891 Personal history of nicotine dependence: Secondary | ICD-10-CM | POA: Insufficient documentation

## 2022-11-20 DIAGNOSIS — N179 Acute kidney failure, unspecified: Secondary | ICD-10-CM | POA: Diagnosis not present

## 2022-11-20 DIAGNOSIS — E1165 Type 2 diabetes mellitus with hyperglycemia: Secondary | ICD-10-CM | POA: Diagnosis not present

## 2022-11-20 DIAGNOSIS — M79604 Pain in right leg: Secondary | ICD-10-CM

## 2022-11-20 DIAGNOSIS — M79606 Pain in leg, unspecified: Secondary | ICD-10-CM | POA: Insufficient documentation

## 2022-11-20 DIAGNOSIS — E782 Mixed hyperlipidemia: Secondary | ICD-10-CM | POA: Diagnosis not present

## 2022-11-20 DIAGNOSIS — E871 Hypo-osmolality and hyponatremia: Secondary | ICD-10-CM | POA: Insufficient documentation

## 2022-11-20 DIAGNOSIS — Z7984 Long term (current) use of oral hypoglycemic drugs: Secondary | ICD-10-CM | POA: Diagnosis not present

## 2022-11-20 DIAGNOSIS — M79605 Pain in left leg: Secondary | ICD-10-CM

## 2022-11-20 DIAGNOSIS — Z720 Tobacco use: Secondary | ICD-10-CM | POA: Diagnosis present

## 2022-11-20 DIAGNOSIS — I10 Essential (primary) hypertension: Secondary | ICD-10-CM | POA: Diagnosis present

## 2022-11-20 DIAGNOSIS — E1100 Type 2 diabetes mellitus with hyperosmolarity without nonketotic hyperglycemic-hyperosmolar coma (NKHHC): Secondary | ICD-10-CM | POA: Diagnosis not present

## 2022-11-20 DIAGNOSIS — N1832 Chronic kidney disease, stage 3b: Secondary | ICD-10-CM | POA: Diagnosis not present

## 2022-11-20 DIAGNOSIS — R7989 Other specified abnormal findings of blood chemistry: Secondary | ICD-10-CM | POA: Diagnosis present

## 2022-11-20 DIAGNOSIS — E1101 Type 2 diabetes mellitus with hyperosmolarity with coma: Secondary | ICD-10-CM | POA: Diagnosis present

## 2022-11-20 LAB — CBC WITH DIFFERENTIAL/PLATELET
Abs Immature Granulocytes: 0.01 10*3/uL (ref 0.00–0.07)
Basophils Absolute: 0 10*3/uL (ref 0.0–0.1)
Basophils Relative: 0 %
Eosinophils Absolute: 0 10*3/uL (ref 0.0–0.5)
Eosinophils Relative: 1 %
HCT: 42.9 % (ref 36.0–46.0)
Hemoglobin: 14.1 g/dL (ref 12.0–15.0)
Immature Granulocytes: 0 %
Lymphocytes Relative: 27 %
Lymphs Abs: 1.7 10*3/uL (ref 0.7–4.0)
MCH: 30.2 pg (ref 26.0–34.0)
MCHC: 32.9 g/dL (ref 30.0–36.0)
MCV: 91.9 fL (ref 80.0–100.0)
Monocytes Absolute: 0.3 10*3/uL (ref 0.1–1.0)
Monocytes Relative: 4 %
Neutro Abs: 4.1 10*3/uL (ref 1.7–7.7)
Neutrophils Relative %: 68 %
Platelets: 323 10*3/uL (ref 150–400)
RBC: 4.67 MIL/uL (ref 3.87–5.11)
RDW: 12.7 % (ref 11.5–15.5)
WBC: 6.2 10*3/uL (ref 4.0–10.5)
nRBC: 0 % (ref 0.0–0.2)

## 2022-11-20 LAB — BASIC METABOLIC PANEL
Anion gap: 12 (ref 5–15)
Anion gap: 12 (ref 5–15)
Anion gap: 8 (ref 5–15)
BUN: 21 mg/dL — ABNORMAL HIGH (ref 6–20)
BUN: 22 mg/dL — ABNORMAL HIGH (ref 6–20)
BUN: 21 mg/dL — ABNORMAL HIGH (ref 6–20)
CO2: 26 mmol/L (ref 22–32)
CO2: 26 mmol/L (ref 22–32)
CO2: 25 mmol/L (ref 22–32)
Calcium: 9.3 mg/dL (ref 8.9–10.3)
Calcium: 9.5 mg/dL (ref 8.9–10.3)
Calcium: 8.9 mg/dL (ref 8.9–10.3)
Chloride: 90 mmol/L — ABNORMAL LOW (ref 98–111)
Chloride: 98 mmol/L (ref 98–111)
Chloride: 101 mmol/L (ref 98–111)
Creatinine, Ser: 2.07 mg/dL — ABNORMAL HIGH (ref 0.44–1.00)
Creatinine, Ser: 1.93 mg/dL — ABNORMAL HIGH (ref 0.44–1.00)
Creatinine, Ser: 1.62 mg/dL — ABNORMAL HIGH (ref 0.44–1.00)
GFR, Estimated: 29 mL/min — ABNORMAL LOW (ref >60)
GFR, Estimated: 31 mL/min — ABNORMAL LOW (ref >60)
GFR, Estimated: 38 mL/min — ABNORMAL LOW (ref >60)
Glucose, Bld: 802 mg/dL (ref 70–99)
Glucose, Bld: 360 mg/dL — ABNORMAL HIGH (ref 70–99)
Glucose, Bld: 161 mg/dL — ABNORMAL HIGH (ref 70–99)
Potassium: 5.1 mmol/L (ref 3.5–5.1)
Potassium: 4 mmol/L (ref 3.5–5.1)
Potassium: 3.7 mmol/L (ref 3.5–5.1)
Sodium: 128 mmol/L — ABNORMAL LOW (ref 135–145)
Sodium: 136 mmol/L (ref 135–145)
Sodium: 134 mmol/L — ABNORMAL LOW (ref 135–145)

## 2022-11-20 LAB — GLUCOSE, CAPILLARY
Glucose-Capillary: 533 mg/dL (ref 70–99)
Glucose-Capillary: 289 mg/dL — ABNORMAL HIGH (ref 70–99)
Glucose-Capillary: 421 mg/dL — ABNORMAL HIGH (ref 70–99)
Glucose-Capillary: 151 mg/dL — ABNORMAL HIGH (ref 70–99)
Glucose-Capillary: 131 mg/dL — ABNORMAL HIGH (ref 70–99)
Glucose-Capillary: 166 mg/dL — ABNORMAL HIGH (ref 70–99)
Glucose-Capillary: 190 mg/dL — ABNORMAL HIGH (ref 70–99)

## 2022-11-20 LAB — MRSA NEXT GEN BY PCR, NASAL: MRSA by PCR Next Gen: NOT DETECTED

## 2022-11-20 LAB — URINALYSIS, ROUTINE W REFLEX MICROSCOPIC
Bilirubin Urine: NEGATIVE
Glucose, UA: >=500 mg/dL — AB
Ketones, ur: NEGATIVE mg/dL
Leukocytes,Ua: NEGATIVE
Nitrite: NEGATIVE
Protein, ur: 100 mg/dL — AB
Specific Gravity, Urine: 1.021 (ref 1.005–1.030)
pH: 7 (ref 5.0–8.0)

## 2022-11-20 LAB — BLOOD GAS, VENOUS
Acid-Base Excess: 7.2 mmol/L — ABNORMAL HIGH (ref 0.0–2.0)
Bicarbonate: 34.3 mmol/L — ABNORMAL HIGH (ref 20.0–28.0)
Drawn by: 44828
O2 Saturation: 42.1 %
Patient temperature: 37.2
pCO2, Ven: 58 mm[Hg] (ref 44–60)
pH, Ven: 7.38 (ref 7.25–7.43)
pO2, Ven: <31 mm[Hg] — CL (ref 32–45)

## 2022-11-20 LAB — CBG MONITORING, ED
Glucose-Capillary: >600 mg/dL (ref 70–99)
Glucose-Capillary: >600 mg/dL (ref 70–99)

## 2022-11-20 LAB — HIV ANTIBODY (ROUTINE TESTING W REFLEX): HIV Screen 4th Generation wRfx: NONREACTIVE

## 2022-11-20 LAB — PREGNANCY, URINE: Preg Test, Ur: NEGATIVE

## 2022-11-20 MED ORDER — FENTANYL CITRATE PF 50 MCG/ML IJ SOSY
50.0000 ug | PREFILLED_SYRINGE | Freq: Once | INTRAMUSCULAR | Status: AC
Start: 2022-11-20 — End: 2022-11-20
  Administered 2022-11-20: 50 ug via INTRAVENOUS
  Filled 2022-11-20: qty 1

## 2022-11-20 MED ORDER — DEXTROSE IN LACTATED RINGERS 5 % IV SOLN: INTRAVENOUS | Status: DC

## 2022-11-20 MED ORDER — INSULIN GLARGINE-YFGN 100 UNIT/ML ~~LOC~~ SOLN: 25.0000 [IU] | Freq: Every day | SUBCUTANEOUS | Status: DC

## 2022-11-20 MED ORDER — LACTATED RINGERS IV BOLUS
1000.0000 mL | INTRAVENOUS | Status: AC
  Administered 2022-11-20 (×2): 1000 mL via INTRAVENOUS

## 2022-11-20 MED ORDER — ONDANSETRON HCL 4 MG/2ML IJ SOLN: 4.0000 mg | Freq: Four times a day (QID) | INTRAMUSCULAR | Status: DC | PRN

## 2022-11-20 MED ORDER — INSULIN ASPART 100 UNIT/ML IJ SOLN
0.0000 [IU] | INTRAMUSCULAR | Status: DC
  Administered 2022-11-20 – 2022-11-21 (×4): 3 [IU] via SUBCUTANEOUS

## 2022-11-20 MED ORDER — ACETAMINOPHEN 325 MG PO TABS: 650.0000 mg | ORAL_TABLET | Freq: Four times a day (QID) | ORAL | Status: DC | PRN

## 2022-11-20 MED ORDER — CHLORHEXIDINE GLUCONATE CLOTH 2 % EX PADS
6.0000 | MEDICATED_PAD | Freq: Every day | CUTANEOUS | Status: DC
  Administered 2022-11-21: 6 via TOPICAL

## 2022-11-20 MED ORDER — INSULIN REGULAR(HUMAN) IN NACL 100-0.9 UT/100ML-% IV SOLN
INTRAVENOUS | Status: DC
  Administered 2022-11-20: 10.5 [IU]/h via INTRAVENOUS
  Filled 2022-11-20: qty 100

## 2022-11-20 MED ORDER — HEPARIN SODIUM (PORCINE) 5000 UNIT/ML IJ SOLN
5000.0000 [IU] | Freq: Three times a day (TID) | INTRAMUSCULAR | Status: DC
  Administered 2022-11-20 – 2022-11-21 (×3): 5000 [IU] via SUBCUTANEOUS
  Filled 2022-11-20 (×3): qty 1

## 2022-11-20 MED ORDER — OXYCODONE HCL 5 MG PO TABS: 5.0000 mg | ORAL_TABLET | ORAL | Status: DC | PRN

## 2022-11-20 MED ORDER — ONDANSETRON HCL 4 MG PO TABS: 4.0000 mg | ORAL_TABLET | Freq: Four times a day (QID) | ORAL | Status: DC | PRN

## 2022-11-20 MED ORDER — ACETAMINOPHEN 650 MG RE SUPP: 650.0000 mg | Freq: Four times a day (QID) | RECTAL | Status: DC | PRN

## 2022-11-20 MED ORDER — LACTATED RINGERS IV SOLN: INTRAVENOUS | Status: AC

## 2022-11-20 MED ORDER — INSULIN GLARGINE-YFGN 100 UNIT/ML ~~LOC~~ SOLN
20.0000 [IU] | Freq: Every day | SUBCUTANEOUS | Status: DC
  Administered 2022-11-20: 20 [IU] via SUBCUTANEOUS
  Filled 2022-11-20 (×2): qty 0.2

## 2022-11-20 MED ORDER — DEXTROSE 50 % IV SOLN: 0.0000 mL | INTRAVENOUS | Status: DC | PRN

## 2022-11-20 MED ORDER — ATORVASTATIN CALCIUM 20 MG PO TABS
20.0000 mg | ORAL_TABLET | Freq: Every day | ORAL | Status: DC
  Administered 2022-11-20 – 2022-11-21 (×2): 20 mg via ORAL
  Filled 2022-11-20 (×2): qty 1

## 2022-11-20 MED ORDER — KETOROLAC TROMETHAMINE 15 MG/ML IJ SOLN
15.0000 mg | Freq: Once | INTRAMUSCULAR | Status: AC
  Administered 2022-11-20: 15 mg via INTRAVENOUS
  Filled 2022-11-20: qty 1

## 2022-11-20 NOTE — Assessment & Plan Note (Signed)
-   Reports half a pack per day - Declines nicotine patch at this time - No reaction when counseled on importance of cessation

## 2022-11-20 NOTE — H&P (Signed)
History and Physical    Patient: Megan Mullins ZOX:[id number] DOB: April 05, 1970 DOA: 11/20/2022 DOS: the patient was seen and examined on 11/20/2022 PCP: Benetta Spar, MD  Patient coming from: Home  Chief Complaint:  Chief Complaint  Patient presents with   Leg Pain   HPI: Megan Mullins is a 52 y.o. female with medical history significant of type 2 diabetes mellitus, history of substance abuse, tobacco use disorder, hypertension, and more presents the ED with a chief complaint of leg pain.  Patient reports that she has bilateral leg pain.  It started in the left about 1 month ago.  The right joint in 3 weeks ago.  It is worse with bearing weight.  She describes it as a constant, aching pain.  She tried a relative's arthritis cream and it helped at first, but then it did not help after that.  Patient reports she has not taken any Tylenol or ibuprofen "because of her liver."  Patient reports that her leg pain is improved after treatment in the ED.  She has never tried gabapentin.  Regarding patient's glucose, patient reports she has always had a high glucose.  Her PCP has been trying to adjust her insulin, and has said that he is going to send her to an endocrinologist.  She is currently on 25 units basal insulin and 4 units preprandial insulin per her report.  She reports compliance with this regimen.  She reports she does not check her glucose at home.  When asked how she knows she can take her insulin safely without checking her glucose she reports she has never had a normal glucose level and a low glucose.  She reports that when she was checking her glucose even her fasting sugars were around 220.  Patient denies polyuria, polydipsia, polyphasia.  She has no other complaints at this time.  Patient does smoke half a pack per day.  She does not drink alcohol per her report.  Patient is full code. Review of Systems: As mentioned in the history of present illness. All other systems reviewed and  are negative. Past Medical History:  Diagnosis Date   Diabetes mellitus without complication (HCC)    Past Surgical History:  Procedure Laterality Date   TUBAL LIGATION     Social History:  reports that she quit smoking about 10 months ago. Her smoking use included cigarettes. She has never used smokeless tobacco. She reports that she does not currently use alcohol. She reports that she does not currently use drugs after having used the following drugs: Cocaine.  No Known Allergies  Family History  Problem Relation Age of Onset   Heart disease Mother    Diabetes Mother     Prior to Admission medications   Medication Sig Start Date End Date Taking? Authorizing Provider  atorvastatin (LIPITOR) 20 MG tablet Take 20 mg by mouth daily. 07/19/22  Yes [provider]  insulin aspart (NOVOLOG) 100 UNIT/ML injection Inject 0-15 Units into the skin 3 (three) times daily with meals. CBG 70 - 120: 0 units CBG 121 - 150: 0 units CBG 151 - 200: 0 units CBG 201 - 250: 2 units CBG 251 - 300: 3 units CBG 301 - 350: 4 units CBG 351 - 400: 5 units 02/03/22  Yes Pahwani, Ravi, MD  insulin glargine (LANTUS SOLOSTAR) 100 UNIT/ML Solostar Pen Inject 25 Units into the skin 2 (two) times daily. Patient taking differently: Inject 25 Units into the skin at bedtime. 02/03/22  Yes  Hughie Closs, MD  losartan-hydrochlorothiazide (HYZAAR) 50-12.5 MG tablet Take 1 tablet by mouth daily. 07/19/22  Yes [provider]  metFORMIN (GLUCOPHAGE) 1000 MG tablet Take 1 tablet (1,000 mg total) by mouth 2 (two) times daily with a meal. 02/03/22 11/20/22 Yes Hughie Closs, MD    Physical Exam: Vitals:   11/20/22 1700 11/20/22 1730 11/20/22 1800 11/20/22 1949  BP: (!) 174/104 (!) 146/78    Pulse: 81 (!) 101 85   Resp: 13 18 20    Temp:    98.8 F (37.1 C)  TempSrc:    Axillary  SpO2: 100% 99% 100%   Weight:      Height:       1.  General: Patient lying supine in bed,  no acute distress   2.  Psychiatric: Alert and oriented x 3, mood and behavior normal for situation, pleasant and cooperative with exam   3. Neurologic: Speech and language are normal, face is symmetric, moves all 4 extremities voluntarily, at baseline without acute deficits on limited exam   4. HEENMT:  Head is atraumatic, normocephalic, pupils reactive to light, neck is supple, trachea is midline, mucous membranes are moist   5. Respiratory : Lungs are clear to auscultation bilaterally without wheezing, rhonchi, rales, no cyanosis, no increase in work of breathing or accessory muscle use   6. Cardiovascular : Heart rate normal, rhythm is regular, no murmurs, rubs or gallops, no peripheral edema, peripheral pulses palpated   7. Gastrointestinal:  Abdomen is soft, nondistended, nontender to palpation bowel sounds active, no masses or organomegaly palpated   8. Skin:  Skin is warm, dry and intact without rashes, acute lesions, or ulcers on limited exam   9.Musculoskeletal:  No acute deformities or trauma, no asymmetry in tone, no peripheral edema, peripheral pulses palpated, no tenderness to palpation in the extremities  Data Reviewed: In the ED Patient was afebrile, heart rate slightly tachycardic up to 102, respiratory rate normal, blood pressure 136/80-163/95, satting 94-100% on room air No leukocytosis, hemoglobin stable Borderline hyperkalemia that is already correcting his glucose was corrected AKI with a creatinine of 2.07 MRI lumbar spine shows L4-L5 degenerative changes with fluid at the facet joint on the right Patient was started on insulin drip, given Toradol, given 1 L LR and continued on LR 125 mL/h Admission requested for HHS and leg pain  Assessment and Plan: * Hyperosmolar hyperglycemic state (HHS) (HCC) - Glucose 802 - pH 7.38 - CO2 26 - Gap 12 - Patient reports compliance with 25 units nightly basal insulin and 4 units preprandial insulin - She does not check her glucose at home -  Hemoglobin A1c pending - Currently on HHS Endo tool - Continue to monitor  Essential hypertension - Holding hydrochlorothiazide and losartan due to AKI  Leg pain - MRI back does show some chronic changes with fluid at the facet joint on the right of L4-L5 - I doubt these MRI findings are the cause of her pain given that her pain started mostly on the left - Possibly diabetic neuropathy? - Consider low-dose gabapentin after AKI has resolved - Patient denies muscle cramping - She does report improvement after treatment in the ED  Pseudohyponatremia - sodium 128 - Corrects for glucose of 802   AKI (acute kidney injury) (HCC) - Creatinine at baseline 1.56, today 2.07 - Fluid bolus given in the ED - Continue IV fluids - Every 4 hours BMP - Creatinine already improving with the most recent BMP at 1.93 - Continue  to monitor, avoid nephrotoxic agents when possible  Tobacco abuse - Reports half a pack per day - Declines nicotine patch at this time - No reaction when counseled on importance of cessation      Advance Care Planning:   Code Status: Full Code  Consults: None at this time  Family Communication: No family at bedside  Severity of Illness: The appropriate patient status for this patient is INPATIENT. Inpatient status is judged to be reasonable and necessary in order to provide the required intensity of service to ensure the patient's safety. The patient's presenting symptoms, physical exam findings, and initial radiographic and laboratory data in the context of their chronic comorbidities is felt to place them at high risk for further clinical deterioration. Furthermore, it is not anticipated that the patient will be medically stable for discharge from the hospital within 2 midnights of admission.   * I certify that at the point of admission it is my clinical judgment that the patient will require inpatient hospital care spanning beyond 2 midnights from the point of  admission due to high intensity of service, high risk for further deterioration and high frequency of surveillance required.*  Author: Lilyan Gilford, DO 11/20/2022 7:55 PM  For on call review [email or web address].

## 2022-11-20 NOTE — Assessment & Plan Note (Signed)
-   sodium 128 - Corrects for glucose of 802

## 2022-11-20 NOTE — Assessment & Plan Note (Signed)
-   Glucose 802 - pH 7.38 - CO2 26 - Gap 12 - Patient reports compliance with 25 units nightly basal insulin and 4 units preprandial insulin - She does not check her glucose at home - Hemoglobin A1c pending - Currently on HHS Endo tool - Continue to monitor

## 2022-11-20 NOTE — Assessment & Plan Note (Signed)
Holding hydrochlorothiazide and losartan due to AKI

## 2022-11-20 NOTE — ED Notes (Signed)
Pt denies taking anything for pain at home.  

## 2022-11-20 NOTE — ED Notes (Signed)
Date and time results received: 11/20/22 1411 (use smartphrase ".now" to insert current time)  Test: Glucose Critical Value: 802  Name of Provider Notified: Park Meo

## 2022-11-20 NOTE — Assessment & Plan Note (Signed)
-   MRI back does show some chronic changes with fluid at the facet joint on the right of L4-L5 - I doubt these MRI findings are the cause of her pain given that her pain started mostly on the left - Possibly diabetic neuropathy? - Consider low-dose gabapentin after AKI has resolved - Patient denies muscle cramping - She does report improvement after treatment in the ED

## 2022-11-20 NOTE — ED Provider Notes (Signed)
[place] EMERGENCY DEPARTMENT AT [facility] Provider Note   CSN: [id number] Arrival date & time: 11/20/22  1213     History  Chief Complaint  Patient presents with   Leg Pain    Megan Mullins is a 52 y.o. female.  She has PMH of diabetes, hypertension.  Is ER today for 2 weeks of gradually worsening bilateral posterior leg pain, started in the right posterior leg, now is both and for the past several days she cannot walk due to severe pain she reports.  Denies fevers or chills, no saddle seizure paresthesia, no bowel or bladder incontinence.  Tried over-the-counter medicine without any significant relief.  She never had this in the past.  Denies any trauma to her back.  Denies any numbness tingling or weakness outside of her usual paresthesias from her diabetic neuropathy.  Patient takes short and long-acting insulin for her diabetes as well as metformin, reports she has been compliant but has not checked her sugar in at least a week.  She ate steak and colored beans for breakfast and had some 7-Up to drink.  Where she does have a diabetes follow-up with her primary care doctor today and they were discussing sending her to endocrinology due to her continuously uncontrolled diabetes but she states the pain was too much and she said to come to the ER instead.  Leg Pain      Home Medications Prior to Admission medications   Medication Sig Start Date End Date Taking? Authorizing Provider  atorvastatin (LIPITOR) 20 MG tablet Take 20 mg by mouth daily. 07/19/22  Yes [provider]  insulin aspart (NOVOLOG) 100 UNIT/ML injection Inject 0-15 Units into the skin 3 (three) times daily with meals. CBG 70 - 120: 0 units CBG 121 - 150: 0 units CBG 151 - 200: 0 units CBG 201 - 250: 2 units CBG 251 - 300: 3 units CBG 301 - 350: 4 units CBG 351 - 400: 5 units 02/03/22  Yes Pahwani, Ravi, MD  insulin glargine (LANTUS SOLOSTAR) 100 UNIT/ML Solostar Pen Inject 25 Units into the skin 2  (two) times daily. Patient taking differently: Inject 25 Units into the skin at bedtime. 02/03/22  Yes Pahwani, Daleen Bo, MD  losartan-hydrochlorothiazide (HYZAAR) 50-12.5 MG tablet Take 1 tablet by mouth daily. 07/19/22  Yes [provider]  metFORMIN (GLUCOPHAGE) 1000 MG tablet Take 1 tablet (1,000 mg total) by mouth 2 (two) times daily with a meal. 02/03/22 11/20/22 Yes Hughie Closs, MD      Allergies    Patient has no known allergies.    Review of Systems   Review of Systems  Physical Exam Updated Vital Signs BP (!) 176/102   Pulse 89   Temp 98.9 F (37.2 C) (Oral)   Resp 13   Ht 5' (1.524 m)   Wt 83.9 kg   SpO2 97%   BMI 36.13 kg/m  Physical Exam Vitals and nursing note reviewed.  Constitutional:      General: She is not in acute distress.    Appearance: She is well-developed.  HENT:     Head: Normocephalic and atraumatic.     Mouth/Throat:     Mouth: Mucous membranes are moist.  Eyes:     Extraocular Movements: Extraocular movements intact.     Conjunctiva/sclera: Conjunctivae normal.     Pupils: Pupils are equal, round, and reactive to light.  Cardiovascular:     Rate and Rhythm: Normal rate and regular rhythm.  Heart sounds: No murmur heard. Pulmonary:     Effort: Pulmonary effort is normal. No respiratory distress.     Breath sounds: Normal breath sounds.  Abdominal:     Palpations: Abdomen is soft.     Tenderness: There is no abdominal tenderness.  Musculoskeletal:        General: No swelling.     Cervical back: Neck supple.  Skin:    General: Skin is warm and dry.     Capillary Refill: Capillary refill takes less than 2 seconds.  Neurological:     General: No focal deficit present.     Mental Status: She is alert and oriented to person, place, and time.  Psychiatric:        Mood and Affect: Mood normal.     ED Results / Procedures / Treatments   Labs (all labs ordered are listed, but only abnormal results are displayed) Labs Reviewed   BASIC METABOLIC PANEL - Abnormal; Notable for the following components:      Result Value   Sodium 128 (*)    Chloride 90 (*)    Glucose, Bld 802 (*)    BUN 21 (*)    Creatinine, Ser 2.07 (*)    GFR, Estimated 29 (*)    All other components within normal limits  CBG MONITORING, ED - Abnormal; Notable for the following components:   Glucose-Capillary >600 (*)    All other components within normal limits  CBC WITH DIFFERENTIAL/PLATELET  URINALYSIS, ROUTINE W REFLEX MICROSCOPIC  BLOOD GAS, VENOUS  PREGNANCY, URINE    EKG None  Radiology MR LUMBAR SPINE WO CONTRAST  Result Date: 11/20/2022 CLINICAL DATA:  Myelopathy, acute, lumbar spine EXAM: MRI LUMBAR SPINE WITHOUT CONTRAST TECHNIQUE: Multiplanar, multisequence MR imaging of the lumbar spine was performed. No intravenous contrast was administered. COMPARISON:  None Available. FINDINGS: Segmentation:  Standard. Alignment:  Physiologic. Vertebrae: No fracture, evidence of discitis, or bone lesion. Degenerative endplate changes of the superior endplate of L1 Conus medullaris and cauda equina: Conus extends to the L1 level. Conus and cauda equina appear normal. Paraspinal and other soft tissues: Negative. Disc levels: T12-L1: Unremarkable L1-L2: Unremarkable L2-L3: Unremarkable L3-L4: Mild bilateral facet degenerative change. No significant disc bulge. No spinal canal narrowing. No neural foraminal narrowing. L4-L5: Moderate to severe bilateral facet degenerative change with fluid in the facet joint in the right. Circumferential disc bulge with an extraforaminal component on the right that may contact the exited L4 nerve root. Mild spinal canal narrowing. There is narrowing of the bilateral lateral recesses. Mild-to-moderate right neural foraminal narrowing. L5-S1: Moderate bilateral facet degenerative change. No significant disc bulge. No spinal canal narrowing. No neural foraminal narrowing. IMPRESSION: 1. No acute abnormality of the lumbar  spine. 2. Moderate to severe bilateral facet degenerative change at L4-L5 with fluid in the facet joint on the right. This could be a source of pain. 3. Circumferential disc bulge at L4-L5 with an extraforaminal component on the right that may contact the exited L4 nerve root. Electronically Signed   By: Lorenza Cambridge M.D.   On: 11/20/2022 14:55    Procedures .Critical Care  Performed by: Ma Rings, PA-C Authorized by: Ma Rings, PA-C   Critical care provider statement:    Critical care time (minutes):  30   Critical care was necessary to treat or prevent imminent or life-threatening deterioration of the following conditions:  Endocrine crisis   Critical care was time spent personally by me on the following activities:  Development of treatment plan with patient or surrogate, discussions with consultants, evaluation of patient's response to treatment, examination of patient, ordering and review of laboratory studies, ordering and review of radiographic studies, ordering and performing treatments and interventions, pulse oximetry, re-evaluation of patient's condition and review of old charts   Care discussed with: admitting provider       Medications Ordered in ED Medications  insulin regular, human (MYXREDLIN) 100 units/ 100 mL infusion (10.5 Units/hr Intravenous New Bag/Given 11/20/22 1547)  lactated ringers infusion (has no administration in time range)  dextrose 5 % in lactated ringers infusion (has no administration in time range)  dextrose 50 % solution 0-50 mL (has no administration in time range)  lactated ringers bolus 1,000 mL (1,000 mLs Intravenous New Bag/Given 11/20/22 1524)  fentaNYL (SUBLIMAZE) injection 50 mcg (50 mcg Intravenous Given 11/20/22 1529)  ketorolac (TORADOL) 15 MG/ML injection 15 mg (15 mg Intravenous Given 11/20/22 1529)    ED Course/ Medical Decision Making/ A&P                                 Medical Decision Making This patient presents to  the ED for concern of bilateral leg pain, this involves an extensive number of treatment options, and is a complaint that carries with it a high risk of complications and morbidity.  The differential diagnosis includes sciatica, muscle strain, contusion, cauda equina, metabolic derangement, other   Co morbidities that complicate the patient evaluation :   DM type II   Additional history obtained:  Additional history obtained from EMR External records from outside source obtained and reviewed including notes, labs   Lab Tests:  I Ordered, and personally interpreted labs.  The pertinent results include: Glucose noted to be 802, creatinine up to 2.07 from baseline 1.56     Consultations Obtained:  I requested consultation with the hospitalist,  and discussed lab and imaging findings as well as pertinent plan - they recommend: admit   Problem List / ED Course / Critical interventions / Medication management  Hyperglycemia-patient does not check her blood sugars regularly, has uncontrolled blood sugar, BMP has blood sugar elevated, mild AKI as well.  Patient given IV fluids, started insulin drip.  Patient admitted to hospital.  Also having bilateral posterior leg pain, having trouble walking due to pain, no calf tenderness or leg swelling suggest VTE.  No injury or trauma.  She had positive straight leg raise so considered possible radiculopathy with the pain bilateral with concern for possible early, so MRI was ordered and shows right sided facet joint fluid and bulging disc possibly contacting the nerve roots to explain pain on the right not necessarily of the left.  Discussed with patient she will need outpatient follow-up  I have reviewed the patients home medicines and have made adjustments as needed  This case was discussed with [facility] hospitalist Dr. Carren Rang for admission  Amount and/or Complexity of Data Reviewed Labs: ordered. Radiology: ordered.  Risk Prescription drug  management. Decision regarding hospitalization.           Final Clinical Impression(s) / ED Diagnoses Final diagnoses:  Hyperglycemia due to diabetes mellitus [facility])    Rx / DC Orders ED Discharge Orders     None         Josem Kaufmann 11/20/22 1610    Cathren Laine, MD 11/23/22 1531

## 2022-11-20 NOTE — ED Triage Notes (Signed)
Pt to er, pt states that she is here for bilateral leg pain and weakness, states that they have been hurting for the past month and keep getting worse.

## 2022-11-20 NOTE — ED Notes (Signed)
Attempted IV access to left AC without success.

## 2022-11-20 NOTE — Assessment & Plan Note (Signed)
-   Creatinine at baseline 1.56, today 2.07 - Fluid bolus given in the ED - Continue IV fluids - Every 4 hours BMP - Creatinine already improving with the most recent BMP at 1.93 - Continue to monitor, avoid nephrotoxic agents when possible

## 2022-11-20 NOTE — Plan of Care (Signed)

## 2022-11-21 ENCOUNTER — Inpatient Hospital Stay (HOSPITAL_COMMUNITY): Payer: No Typology Code available for payment source

## 2022-11-21 DIAGNOSIS — Z720 Tobacco use: Secondary | ICD-10-CM | POA: Diagnosis not present

## 2022-11-21 DIAGNOSIS — R7989 Other specified abnormal findings of blood chemistry: Secondary | ICD-10-CM | POA: Diagnosis not present

## 2022-11-21 DIAGNOSIS — N179 Acute kidney failure, unspecified: Secondary | ICD-10-CM | POA: Diagnosis not present

## 2022-11-21 DIAGNOSIS — E1101 Type 2 diabetes mellitus with hyperosmolarity with coma: Secondary | ICD-10-CM | POA: Diagnosis present

## 2022-11-21 DIAGNOSIS — E1100 Type 2 diabetes mellitus with hyperosmolarity without nonketotic hyperglycemic-hyperosmolar coma (NKHHC): Secondary | ICD-10-CM | POA: Diagnosis not present

## 2022-11-21 LAB — CBC WITH DIFFERENTIAL/PLATELET
Abs Immature Granulocytes: 0.02 10*3/uL (ref 0.00–0.07)
Basophils Absolute: 0 10*3/uL (ref 0.0–0.1)
Basophils Relative: 1 %
Eosinophils Absolute: 0.1 10*3/uL (ref 0.0–0.5)
Eosinophils Relative: 2 %
HCT: 35.7 % — ABNORMAL LOW (ref 36.0–46.0)
Hemoglobin: 11.9 g/dL — ABNORMAL LOW (ref 12.0–15.0)
Immature Granulocytes: 0 %
Lymphocytes Relative: 51 %
Lymphs Abs: 3 10*3/uL (ref 0.7–4.0)
MCH: 30.4 pg (ref 26.0–34.0)
MCHC: 33.3 g/dL (ref 30.0–36.0)
MCV: 91.3 fL (ref 80.0–100.0)
Monocytes Absolute: 0.4 10*3/uL (ref 0.1–1.0)
Monocytes Relative: 7 %
Neutro Abs: 2.2 10*3/uL (ref 1.7–7.7)
Neutrophils Relative %: 39 %
Platelets: 294 10*3/uL (ref 150–400)
RBC: 3.91 MIL/uL (ref 3.87–5.11)
RDW: 12.6 % (ref 11.5–15.5)
WBC: 5.7 10*3/uL (ref 4.0–10.5)
nRBC: 0 % (ref 0.0–0.2)

## 2022-11-21 LAB — COMPREHENSIVE METABOLIC PANEL
ALT: 16 U/L (ref 0–44)
AST: 18 U/L (ref 15–41)
Albumin: 2.9 g/dL — ABNORMAL LOW (ref 3.5–5.0)
Alkaline Phosphatase: 75 U/L (ref 38–126)
Anion gap: 8 (ref 5–15)
BUN: 20 mg/dL (ref 6–20)
CO2: 25 mmol/L (ref 22–32)
Calcium: 8.8 mg/dL — ABNORMAL LOW (ref 8.9–10.3)
Chloride: 103 mmol/L (ref 98–111)
Creatinine, Ser: 1.59 mg/dL — ABNORMAL HIGH (ref 0.44–1.00)
GFR, Estimated: 39 mL/min — ABNORMAL LOW (ref >60)
Glucose, Bld: 102 mg/dL — ABNORMAL HIGH (ref 70–99)
Potassium: 3.5 mmol/L (ref 3.5–5.1)
Sodium: 136 mmol/L (ref 135–145)
Total Bilirubin: 0.5 mg/dL (ref <1.2)
Total Protein: 6.1 g/dL — ABNORMAL LOW (ref 6.5–8.1)

## 2022-11-21 LAB — BASIC METABOLIC PANEL
Anion gap: 7 (ref 5–15)
Anion gap: 10 (ref 5–15)
BUN: 21 mg/dL — ABNORMAL HIGH (ref 6–20)
BUN: 19 mg/dL (ref 6–20)
CO2: 25 mmol/L (ref 22–32)
CO2: 24 mmol/L (ref 22–32)
Calcium: 8.7 mg/dL — ABNORMAL LOW (ref 8.9–10.3)
Calcium: 8.9 mg/dL (ref 8.9–10.3)
Chloride: 104 mmol/L (ref 98–111)
Chloride: 101 mmol/L (ref 98–111)
Creatinine, Ser: 1.59 mg/dL — ABNORMAL HIGH (ref 0.44–1.00)
Creatinine, Ser: 1.59 mg/dL — ABNORMAL HIGH (ref 0.44–1.00)
GFR, Estimated: 39 mL/min — ABNORMAL LOW (ref >60)
GFR, Estimated: 39 mL/min — ABNORMAL LOW (ref >60)
Glucose, Bld: 118 mg/dL — ABNORMAL HIGH (ref 70–99)
Glucose, Bld: 140 mg/dL — ABNORMAL HIGH (ref 70–99)
Potassium: 3.3 mmol/L — ABNORMAL LOW (ref 3.5–5.1)
Potassium: 3.7 mmol/L (ref 3.5–5.1)
Sodium: 136 mmol/L (ref 135–145)
Sodium: 135 mmol/L (ref 135–145)

## 2022-11-21 LAB — GLUCOSE, CAPILLARY
Glucose-Capillary: 143 mg/dL — ABNORMAL HIGH (ref 70–99)
Glucose-Capillary: 117 mg/dL — ABNORMAL HIGH (ref 70–99)
Glucose-Capillary: 160 mg/dL — ABNORMAL HIGH (ref 70–99)
Glucose-Capillary: 173 mg/dL — ABNORMAL HIGH (ref 70–99)

## 2022-11-21 LAB — MAGNESIUM: Magnesium: 1.9 mg/dL (ref 1.7–2.4)

## 2022-11-21 MED ORDER — HYDROCODONE-ACETAMINOPHEN 5-325 MG PO TABS: 1.0000 | ORAL_TABLET | Freq: Four times a day (QID) | ORAL | Status: DC | PRN

## 2022-11-21 MED ORDER — AMLODIPINE BESYLATE 5 MG PO TABS: 5.0000 mg | ORAL_TABLET | Freq: Every day | ORAL | 1 refills | Status: DC

## 2022-11-21 MED ORDER — AMLODIPINE BESYLATE 5 MG PO TABS: 5.0000 mg | ORAL_TABLET | Freq: Every day | ORAL | Status: DC

## 2022-11-21 MED ORDER — HYDROCODONE-ACETAMINOPHEN 5-325 MG PO TABS: 1.0000 | ORAL_TABLET | Freq: Four times a day (QID) | ORAL | 0 refills | Status: DC | PRN

## 2022-11-21 NOTE — TOC Transition Note (Signed)
Transition of Care [facility]) - CM/SW Discharge Note   Patient Details  Name: Megan Mullins MRN: [id number] Date of Birth: Apr 10, 1970  Transition of Care [facility]) CM/SW Contact:  Villa Herb, LCSWA Phone Number: 11/21/2022, 1:36 PM  Clinical Narrative:    CSW updated that PT is recommending HH PT and a walker for pt at D/C. CSW spoke with pt at bedside who is agreeable to [facility] and DME. No agency preference. CSW spoke to [place] with [facility] who accepted [facility] PT referral, MD placed HH orders. CSW spoke to [place] with [facility] who will deliver rolling walker to room for pt, DME orders in place. TOC signing off.   Final next level of care: Home w Home Health Services Barriers to Discharge: No Barriers Identified   Patient Goals and CMS Choice CMS Medicare.gov Compare Post Acute Care list provided to:: Patient Choice offered to / list presented to : Patient  Discharge Placement                         Discharge Plan and Services Additional resources added to the After Visit Summary for                  DME Arranged: Walker rolling DME Agency: [facility] Date DME Agency Contacted: 11/21/22   Representative spoke with at DME Agency: Ian Malkin HH Arranged: PT HH Agency: [facility] Date HH Agency Contacted: 11/21/22   Representative spoke with at [facility] Agency: Morrie Sheldon  Social Determinants of Health (SDOH) Interventions SDOH Screenings   Food Insecurity: No Food Insecurity (11/20/2022)  Housing: Low Risk  (11/20/2022)  Transportation Needs: No Transportation Needs (11/20/2022)  Utilities: Not At Risk (11/20/2022)  Tobacco Use: Medium Risk (11/20/2022)     Readmission Risk Interventions     No data to display

## 2022-11-21 NOTE — TOC CM/SW Note (Signed)
Transition of Care [facility]) - Inpatient Brief Assessment   Patient Details  Name: Megan Mullins MRN: [id number] Date of Birth: 1970-02-19  Transition of Care [facility]) CM/SW Contact:    Villa Herb, LCSWA Phone Number: 11/21/2022, 10:07 AM   Clinical Narrative: Transition of Care Department [facility]) has reviewed patient and no TOC needs have been identified at this time. We will continue to monitor patient advancement through interdisciplinary progression rounds. If new patient transition needs arise, please place a TOC consult.  Transition of Care Asessment: Insurance and Status: Insurance coverage has been reviewed Patient has primary care physician: Yes Home environment has been reviewed: from home Prior level of function:: independent Prior/Current Home Services: No current home services Social Determinants of Health Reivew: SDOH reviewed no interventions necessary Readmission risk has been reviewed: Yes Transition of care needs: no transition of care needs at this time

## 2022-11-21 NOTE — Hospital Course (Addendum)
52 year old female with a history of diabetes mellitus type 2, hypertension, tobacco abuse, CKD presenting with bilateral leg pain for 4 weeks.  She states that it started in the right posterior leg/hip area but now is affecting both legs for 2 weeks.  She denies any recent injuries or falls.  She states that the pain is in the buttock and hip areas bilaterally and goes down to her knees.  She denies her legs giving way or weakness.  She is having pain in her bilateral hip areas down to the knees with weightbearing and walking.  She denied any bowel or bladder incontinence.  She denies any saddle anesthesia.  She denies any recent injury or trauma to the back.  Patient denies fevers, chills, headache, chest pain, dyspnea, nausea, vomiting, diarrhea, abdominal pain, dysuria, hematuria, hematochezia, and melena.   Regarding her diabetes, the patient states that she does not check her sugars.  She endorses compliance with her insulin, but endorses dietary indiscretion.  She states that she usually misses taking her long-acting insulin once or twice per week.  She states that she takes short acting insulin 3 to 4 units with breakfast and dinner.  She had no idea that her sugars were in the 800 range because she does not check her sugars regularly. She denies any abdominal pain, nausea, vomiting. In the ED, the patient was afebrile and hemodynamically stable.  She was noted to have a sodium 120, potassium 5.1, bicarbonate 26, serum creatinine 2.07 which serum glucose 802.  WBC 6.2, hemoglobin 14.1, platelet 323.  The patient was started on insulin drip and IV fluids. She improved clinically and was transitioned to subcutaneous insulin. Regarding her leg pain, MRI lumbar spine showed no acute abnormalities.  There was moderate to severe bilateral facet degenerative changes L4-5 with fluid in the right facet joint.  There is circumferential disc bulging L4-5 with an extraforaminal component which may contact an L4  nerve root.  Physical therapy was consulted.  The patient was discharged home with tramadol.  She will need outpatient referral to neurosurgery.

## 2022-11-21 NOTE — Discharge Summary (Signed)
Physician Discharge Summary   Patient: Megan Mullins MRN: [id number] DOB: 1970/12/11  Admit date:     11/20/2022  Discharge date: 11/21/22  Discharge Physician: Onalee Hua Kaetlyn Noa   PCP: Benetta Spar, MD   Recommendations at discharge:   Please follow up with primary care provider within 1-2 weeks  Please repeat BMP and CBC in one week      Hospital Course: 52 year old female with a history of diabetes mellitus type 2, hypertension, tobacco abuse, CKD presenting with bilateral leg pain for 4 weeks.  She states that it started in the right posterior leg/hip area but now is affecting both legs for 2 weeks.  She denies any recent injuries or falls.  She states that the pain is in the buttock and hip areas bilaterally and goes down to her knees.  She denies her legs giving way or weakness.  She is having pain in her bilateral hip areas down to the knees with weightbearing and walking.  She denied any bowel or bladder incontinence.  She denies any saddle anesthesia.  She denies any recent injury or trauma to the back.  Patient denies fevers, chills, headache, chest pain, dyspnea, nausea, vomiting, diarrhea, abdominal pain, dysuria, hematuria, hematochezia, and melena.   Regarding her diabetes, the patient states that she does not check her sugars.  She endorses compliance with her insulin, but endorses dietary indiscretion.  She states that she usually misses taking her long-acting insulin once or twice per week.  She states that she takes short acting insulin 3 to 4 units with breakfast and dinner.  She had no idea that her sugars were in the 800 range because she does not check her sugars regularly. She denies any abdominal pain, nausea, vomiting. In the ED, the patient was afebrile and hemodynamically stable.  She was noted to have a sodium 120, potassium 5.1, bicarbonate 26, serum creatinine 2.07 which serum glucose 802.  WBC 6.2, hemoglobin 14.1, platelet 323.  The patient was started on  insulin drip and IV fluids. She improved clinically and was transitioned to subcutaneous insulin. Regarding her leg pain, MRI lumbar spine showed no acute abnormalities.  There was moderate to severe bilateral facet degenerative changes L4-5 with fluid in the right facet joint.  There is circumferential disc bulging L4-5 with an extraforaminal component which may contact an L4 nerve root.  Physical therapy was consulted.  The patient was discharged home with tramadol.  She will need outpatient referral to neurosurgery.  Assessment and Plan: Hyperosmolar nonketotic state -patient started on IV insulin with q 1 hour CBG check and q 4 hour BMPs -pt started on aggressive fluid resuscitation -Electrolytes were monitored and repleted -transitioned to [place] insulin once anion gap closed -diet was advanced once anion gap closed -HbA1C--pending  Acute on chronic renal failure--CKD stage IIIb -Baseline creatinine 1.4-1.6 -Presented with serum creatinine 2.07 -Secondary to volume depletion -Improved with IV fluids  Lumbar radiculopathy/leg pain -MRI lumbar spine as discussed above -PT evaluation>>HHPT -Patient will need outpatient neurosurgery follow-up -11/5xray pelvis--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, similar to the prior study  Uncontrolled diabetes mellitus type 2 with hyperglycemia -Continue Semglee -Patient has poor insight regarding her insulin -Restart metformin after discharge -05/29/22 A1C--8.7 -11/4 A1C--pending  Mixed hyperlipidemia -Continue statin  Essential hypertension -Holding losartan/HCTZ secondary to acute on chronic renal failure -Start amlodipine  Hyponatremia -Secondary to volume depletion and hyperglycemia -Improved with isotonic saline      Consultants: none Procedures performed: none  Disposition: Home Diet  recommendation:  Carb modified diet DISCHARGE MEDICATION: Allergies as of 11/21/2022   No Known Allergies       Medication List     STOP taking these medications    losartan-hydrochlorothiazide 50-12.5 MG tablet Commonly known as: HYZAAR       TAKE these medications    amLODipine 5 MG tablet Commonly known as: NORVASC Take 1 tablet (5 mg total) by mouth daily.   atorvastatin 20 MG tablet Commonly known as: LIPITOR Take 20 mg by mouth daily.   HYDROcodone-acetaminophen 5-325 MG tablet Commonly known as: NORCO/VICODIN Take 1 tablet by mouth every 6 (six) hours as needed for moderate pain (pain score 4-6).   insulin aspart 100 UNIT/ML injection Commonly known as: novoLOG Inject 0-15 Units into the skin 3 (three) times daily with meals. CBG 70 - 120: 0 units CBG 121 - 150: 0 units CBG 151 - 200: 0 units CBG 201 - 250: 2 units CBG 251 - 300: 3 units CBG 301 - 350: 4 units CBG 351 - 400: 5 units   Lantus SoloStar 100 UNIT/ML Solostar Pen Generic drug: insulin glargine Inject 25 Units into the skin 2 (two) times daily. What changed: when to take this   metFORMIN 1000 MG tablet Commonly known as: GLUCOPHAGE Take 1 tablet (1,000 mg total) by mouth 2 (two) times daily with a meal.               Durable Medical Equipment  (From admission, onward)           Start     Ordered   11/21/22 1124  For home use only DME Walker rolling  Once       Comments: Patient unsteady on feet and at risk for falls ambulating without AD, patient safer using RW with good return for use demonstrated.  Question Answer Comment  Walker: With 5 Inch Wheels   Patient needs a walker to treat with the following condition Gait difficulty      11/21/22 1124            Discharge Exam: Filed Weights   11/20/22 1227 11/20/22 1637 11/20/22 2258  Weight: 83.9 kg 81.7 kg 84.1 kg   HEENT:  [place]/AT, No thrush, no icterus CV:  RRR, no rub, no S3, no S4 Lung:  CTA, no wheeze, no rhonchi Abd:  soft/+BS, NT Ext:  No edema, no lymphangitis, no synovitis, no rash   Condition at discharge: stable  The  results of significant diagnostics from this hospitalization (including imaging, microbiology, ancillary and laboratory) are listed below for reference.   Imaging Studies: DG HIP UNILAT WITH PELVIS 2-3 VIEWS RIGHT  Result Date: 11/21/2022 CLINICAL DATA:  Right hip pain EXAM: DG HIP (WITH OR WITHOUT PELVIS) 2-3V RIGHT COMPARISON:  Hip radiographs 06/26/2022 FINDINGS: There is no acute osseous finding in the hips. Again seen is severe left and moderate right femoroacetabular joint space narrowing with associated subchondral sclerosis and osteophytosis consistent with osteoarthritis. The appearance is similar to the prior study. There is no erosive change. The SI joints and symphysis pubis are intact. The soft tissues are unremarkable. IMPRESSION: 1. No acute osseous finding. 2. 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 Electronically Signed   By: Lesia Hausen M.D.   On: 11/21/2022 11:42   MR LUMBAR SPINE WO CONTRAST  Result Date: 11/20/2022 CLINICAL DATA:  Myelopathy, acute, lumbar spine EXAM: MRI LUMBAR SPINE WITHOUT CONTRAST TECHNIQUE: Multiplanar, multisequence MR imaging of the lumbar spine  was performed. No intravenous contrast was administered. COMPARISON:  None Available. FINDINGS: Segmentation:  Standard. Alignment:  Physiologic. Vertebrae: No fracture, evidence of discitis, or bone lesion. Degenerative endplate changes of the superior endplate of L1 Conus medullaris and cauda equina: Conus extends to the L1 level. Conus and cauda equina appear normal. Paraspinal and other soft tissues: Negative. Disc levels: T12-L1: Unremarkable L1-L2: Unremarkable L2-L3: Unremarkable L3-L4: Mild bilateral facet degenerative change. No significant disc bulge. No spinal canal narrowing. No neural foraminal narrowing. L4-L5: Moderate to severe bilateral facet degenerative change with fluid in the facet joint in the right. Circumferential disc bulge with an  extraforaminal component on the right that may contact the exited L4 nerve root. Mild spinal canal narrowing. There is narrowing of the bilateral lateral recesses. Mild-to-moderate right neural foraminal narrowing. L5-S1: Moderate bilateral facet degenerative change. No significant disc bulge. No spinal canal narrowing. No neural foraminal narrowing. IMPRESSION: 1. No acute abnormality of the lumbar spine. 2. Moderate to severe bilateral facet degenerative change at L4-L5 with fluid in the facet joint on the right. This could be a source of pain. 3. Circumferential disc bulge at L4-L5 with an extraforaminal component on the right that may contact the exited L4 nerve root. Electronically Signed   By: Lorenza Cambridge M.D.   On: 11/20/2022 14:55    Microbiology: Results for orders placed or performed during the hospital encounter of 11/20/22  MRSA Next Gen by PCR, Nasal     Status: None   Collection Time: 11/20/22  4:31 PM   Specimen: Nasal Mucosa; Nasal Swab  Result Value Ref Range Status   MRSA by PCR Next Gen NOT DETECTED NOT DETECTED Final    Comment: (NOTE) The GeneXpert MRSA Assay (FDA approved for NASAL specimens only), is one component of a comprehensive MRSA colonization surveillance program. It is not intended to diagnose MRSA infection nor to guide or monitor treatment for MRSA infections. Test performance is not FDA approved in patients less than 32 years old. Performed at [facility], [address] [place], [place] [address]     Labs: CBC: Recent Labs  Lab 11/20/22 1326 11/21/22 0416  WBC 6.2 5.7  NEUTROABS 4.1 2.2  HGB 14.1 11.9*  HCT 42.9 35.7*  MCV 91.9 91.3  PLT 323 294   Basic Metabolic Panel: Recent Labs  Lab 11/20/22 1728 11/20/22 2113 11/21/22 0113 11/21/22 0416 11/21/22 0915  NA 136 134* 136 136 135  K 4.0 3.7 3.3* 3.5 3.7  CL 98 101 104 103 101  CO2 26 25 25 25 24   GLUCOSE 360* 161* 118* 102* 140*  BUN 22* 21* 21* 20 19  CREATININE 1.93* 1.62* 1.59*  1.59* 1.59*  CALCIUM 9.5 8.9 8.7* 8.8* 8.9  MG  --   --   --  1.9  --    Liver Function Tests: Recent Labs  Lab 11/21/22 0416  AST 18  ALT 16  ALKPHOS 75  BILITOT 0.5  PROT 6.1*  ALBUMIN 2.9*   CBG: Recent Labs  Lab 11/20/22 2257 11/21/22 0110 11/21/22 0415 11/21/22 0803 11/21/22 1135  GLUCAP 190* 143* 117* 160* 173*    Discharge time spent: greater than 30 minutes.  Signed: Catarina Hartshorn, MD [facility] 11/21/2022

## 2022-11-21 NOTE — Evaluation (Signed)
Physical Therapy Evaluation Patient Details Name: Megan Mullins MRN: [id number] DOB: 21-Nov-1970 Today's Date: 11/21/2022  History of Present Illness  Megan Mullins is a 52 y.o. female with medical history significant of type 2 diabetes mellitus, history of substance abuse, tobacco use disorder, hypertension, and more presents the ED with a chief complaint of leg pain.  Patient reports that she has bilateral leg pain.  It started in the left about 1 month ago.  The right joint in 3 weeks ago.  It is worse with bearing weight.  She describes it as a constant, aching pain.  She tried a relative's arthritis cream and it helped at first, but then it did not help after that.  Patient reports she has not taken any Tylenol or ibuprofen "because of her liver."  Patient reports that her leg pain is improved after treatment in the ED.  She has never tried gabapentin.  Regarding patient's glucose, patient reports she has always had a high glucose.  Her PCP has been trying to adjust her insulin, and has said that he is going to send her to an endocrinologist.  She is currently on 25 units basal insulin and 4 units preprandial insulin per her report.  She reports compliance with this regimen.  She reports she does not check her glucose at home.  When asked how she knows she can take her insulin safely without checking her glucose she reports she has never had a normal glucose level and a low glucose.  She reports that when she was checking her glucose even her fasting sugars were around 220.  Patient denies polyuria, polydipsia, polyphasia.  She has no other complaints at this time.   Clinical Impression  Patient demonstrates unsteady labored movement having to lean on nearby objects for support when walking without an AD, safer using RW with good return for ambulatiing in room/hallways without loss of balance.  Plan:  Patient discharged from physical therapy to care of nursing for ambulation daily as tolerated for length  of stay.       If plan is discharge home, recommend the following: A little help with walking and/or transfers;A little help with bathing/dressing/bathroom;Help with stairs or ramp for entrance;Assistance with cooking/housework   Can travel by private vehicle        Equipment Recommendations Rolling walker (2 wheels)  Recommendations for Other Services       Functional Status Assessment Patient has had a recent decline in their functional status and demonstrates the ability to make significant improvements in function in a reasonable and predictable amount of time.     Precautions / Restrictions Precautions Precautions: Fall Restrictions Weight Bearing Restrictions: No      Mobility  Bed Mobility Overal bed mobility: Modified Independent                  Transfers Overall transfer level: Modified independent                      Ambulation/Gait Ambulation/Gait assistance: Modified independent (Device/Increase time), Supervision Gait Distance (Feet): 100 Feet Assistive device: Rolling walker (2 wheels) Gait Pattern/deviations: Decreased step length - left, Decreased stance time - right, Decreased stride length Gait velocity: decreased     General Gait Details: unsteady labored movement having to lean on nearby objects for support, safer using RW with good return for ambulatiing in room/hallways without loss of balance  Art therapist  Mobility     Tilt Bed    Modified Rankin (Stroke Patients Only)       Balance Overall balance assessment: Needs assistance Sitting-balance support: Feet supported, No upper extremity supported Sitting balance-Leahy Scale: Good Sitting balance - Comments: seated at EOB   Standing balance support: During functional activity, No upper extremity supported Standing balance-Leahy Scale: Poor Standing balance comment: fair/poor without AD, fair/good using RW                              Pertinent Vitals/Pain Pain Assessment Pain Assessment: Faces Faces Pain Scale: Hurts little more Pain Location: bilateral feet Pain Descriptors / Indicators: Sore Pain Intervention(s): Limited activity within patient's tolerance, Monitored during session, Repositioned    Home Living Family/patient expects to be discharged to:: Private residence Living Arrangements: Children;Parent Available Help at Discharge: Family;Available PRN/intermittently Type of Home: House Home Access: Ramped entrance       Home Layout: One level Home Equipment: None      Prior Function Prior Level of Function : Independent/Modified Independent             Mobility Comments: Community ambulator without AD, does not drive ADLs Comments: Independent     Extremity/Trunk Assessment   Upper Extremity Assessment Upper Extremity Assessment: Overall WFL for tasks assessed    Lower Extremity Assessment Lower Extremity Assessment: Generalized weakness    Cervical / Trunk Assessment Cervical / Trunk Assessment: Normal  Communication   Communication Communication: No apparent difficulties  Cognition Arousal: Alert Behavior During Therapy: WFL for tasks assessed/performed Overall Cognitive Status: Within Functional Limits for tasks assessed                                          General Comments      Exercises     Assessment/Plan    PT Assessment All further PT needs can be met in the next venue of care  PT Problem List Decreased strength;Decreased activity tolerance;Decreased balance;Decreased mobility       PT Treatment Interventions      PT Goals (Current goals can be found in the Care Plan section)  Acute Rehab PT Goals Patient Stated Goal: return home with family to assist PT Goal Formulation: With patient Time For Goal Achievement: 11/21/22 Potential to Achieve Goals: Good    Frequency       Co-evaluation               AM-PAC PT "6  Clicks" Mobility  Outcome Measure Help needed turning from your back to your side while in a flat bed without using bedrails?: None Help needed moving from lying on your back to sitting on the side of a flat bed without using bedrails?: None Help needed moving to and from a bed to a chair (including a wheelchair)?: None Help needed standing up from a chair using your arms (e.g., wheelchair or bedside chair)?: None Help needed to walk in hospital room?: A Little Help needed climbing 3-5 steps with a railing? : A Little 6 Click Score: 22    End of Session   Activity Tolerance: Patient tolerated treatment well Patient left: in bed;with call bell/phone within reach Nurse Communication: Mobility status PT Visit Diagnosis: Unsteadiness on feet (R26.81);Other abnormalities of gait and mobility (R26.89);Muscle weakness (generalized) (M62.81)    Time: 1610-9604 PT Time Calculation (  min) (ACUTE ONLY): 13 min   Charges:   PT Evaluation $PT Eval Low Complexity: 1 Low PT Treatments $Therapeutic Activity: 8-22 mins PT General Charges $$ ACUTE PT VISIT: 1 Visit         12:30 PM, 11/21/22 Ocie Bob, MPT Physical Therapist with [facility] [phone] office [phone] mobile phone

## 2022-11-22 LAB — HEMOGLOBIN A1C
Hgb A1c MFr Bld: >15.5 % — ABNORMAL HIGH (ref 4.8–5.6)
Mean Plasma Glucose: >398 mg/dL

## 2022-12-12 ENCOUNTER — Ambulatory Visit: Payer: No Typology Code available for payment source | Admitting: Orthopedic Surgery

## 2022-12-12 ENCOUNTER — Encounter: Payer: Self-pay | Admitting: Orthopedic Surgery

## 2022-12-12 VITALS — BP 148/96 | HR 112 | Ht 61.0 in | Wt 184.0 lb

## 2022-12-12 DIAGNOSIS — M7061 Trochanteric bursitis, right hip: Secondary | ICD-10-CM | POA: Diagnosis not present

## 2022-12-12 DIAGNOSIS — M7062 Trochanteric bursitis, left hip: Secondary | ICD-10-CM | POA: Diagnosis not present

## 2022-12-12 NOTE — Progress Notes (Signed)
New Patient Visit  Assessment: Megan Mullins is a 52 y.o. female with the following: 1. Greater trochanteric bursitis of right hip 2. Greater trochanteric bursitis of left hip   Plan: BERNADEAN PARHAM has pain in bilateral hips.  Pain is primarily over the lateral aspect of bilateral hips.  She has tenderness over the greater trochanter.  Pain radiates distally.  Radiographs were reviewed in clinic today which demonstrate moderate to severe degenerative changes in bilateral hips.  This could be contributing to her ongoing pain.  She is a diabetic.  Most recent A1c is greater than 15.  As result, she would not be a good candidate at this time for consideration for a hip replacement.  This was discussed.  All questions have been answered.  For the pain in the lateral hip, I discussed proceeding with a steroid injection.  The right hip was addressed today in clinic.  She will monitor her blood sugar closely, and if interested, she could return for an injection of the left lateral hip in 1-2 weeks.   Procedure note injection - Right lateral hip   Verbal consent was obtained to inject the Right lateral hip.  Patient localized the pain. Timeout was completed to confirm the site of injection.  The skin was prepped with alcohol and ethyl chloride was sprayed at the injection site.  A 21-gauge needle was used to inject 40 mg of Depo-Medrol and 1% lidocaine (4 cc) into the Right lateral hip, directly over the localized tenderness using a direct lateral approach.  There were no complications. A sterile bandage was applied.   Follow-up: Return if symptoms worsen or fail to improve.  Subjective:  Chief Complaint  Patient presents with   Hip Pain    Bil hip pain x 6 months and has became worse over time  getting to the point its hard to walk , keeps her up at night     History of Present Illness: Megan Mullins is a 52 y.o. female who has been referred by  Avon Gully, MD for evaluation of lateral  hip pain.  She states that she has pain in bilateral hips for at least 6 months.  Pain is progressively worsening.  Pain is lateral.  She is unable to lay on her side.  She denies pain in her groin.  Medications have not been effective.  She notes that the pain radiates from the lateral hip distally towards the knee.  No numbness or tingling.   Review of Systems: No fevers or chills No numbness or tingling No chest pain No shortness of breath No bowel or bladder dysfunction No GI distress No headaches   Medical History:  Past Medical History:  Diagnosis Date   Diabetes mellitus without complication (HCC)     Past Surgical History:  Procedure Laterality Date   TUBAL LIGATION      Family History  Problem Relation Age of Onset   Heart disease Mother    Diabetes Mother    Social History   Tobacco Use   Smoking status: Former    Current packs/day: 0.00    Types: Cigarettes    Quit date: 01/16/2022    Years since quitting: 0.9   Smokeless tobacco: Never  Vaping Use   Vaping status: Never Used  Substance Use Topics   Alcohol use: Not Currently    Comment: haven't drank in 2xmos   Drug use: Not Currently    Types: Cocaine    Comment: last used today  No Known Allergies  Current Meds  Medication Sig   amLODipine (NORVASC) 5 MG tablet Take 1 tablet (5 mg total) by mouth daily.   atorvastatin (LIPITOR) 20 MG tablet Take 20 mg by mouth daily.   HYDROcodone-acetaminophen (NORCO/VICODIN) 5-325 MG tablet Take 1 tablet by mouth every 6 (six) hours as needed for moderate pain (pain score 4-6).   insulin aspart (NOVOLOG) 100 UNIT/ML injection Inject 0-15 Units into the skin 3 (three) times daily with meals. CBG 70 - 120: 0 units CBG 121 - 150: 0 units CBG 151 - 200: 0 units CBG 201 - 250: 2 units CBG 251 - 300: 3 units CBG 301 - 350: 4 units CBG 351 - 400: 5 units   insulin glargine (LANTUS SOLOSTAR) 100 UNIT/ML Solostar Pen Inject 25 Units into the skin 2 (two) times daily.  (Patient taking differently: Inject 25 Units into the skin at bedtime.)    Objective: BP (!) 148/96   Pulse (!) 112   Ht 5\' 1"  (1.549 m)   Wt 184 lb (83.5 kg)   BMI 34.77 kg/m   Physical Exam:  General: Alert and oriented. and No acute distress. Gait: Slow, waddling gait.  Evaluation bilateral hips demonstrates no deformity.  She has exquisite tenderness to palpation over the greater trochanter laterally.  Right is worse than left.  She denies pain in her groin with internal and external rotation.  She does have some discomfort with internal and external rotation of the hip, but this aggravates the pain laterally.  Negative straight leg raise.  Sensation intact distally.  IMAGING: I personally reviewed images previously obtained in clinic  X-rays of the right hip were previously obtained.  AP pelvis available.  Advanced degenerative changes bilaterally.  There are osteophytes and fragmentation in the outer rim of the acetabulum.   New Medications:  No orders of the defined types were placed in this encounter.     Oliver Barre, MD  12/12/2022 4:14 PM

## 2022-12-12 NOTE — Patient Instructions (Signed)

## 2023-04-22 ENCOUNTER — Encounter (HOSPITAL_COMMUNITY): Payer: Self-pay

## 2023-04-22 ENCOUNTER — Emergency Department (HOSPITAL_COMMUNITY): Payer: Self-pay

## 2023-04-22 ENCOUNTER — Inpatient Hospital Stay (HOSPITAL_COMMUNITY)
Admission: EM | Admit: 2023-04-22 | Discharge: 2023-04-27 | DRG: 500 | Disposition: A | Discharge status: Home Health | Payer: Self-pay | Attending: Internal Medicine | Admitting: Internal Medicine

## 2023-04-22 ENCOUNTER — Other Ambulatory Visit: Payer: Self-pay

## 2023-04-22 DIAGNOSIS — L02214 Cutaneous abscess of groin: Secondary | ICD-10-CM | POA: Diagnosis present

## 2023-04-22 DIAGNOSIS — L03115 Cellulitis of right lower limb: Secondary | ICD-10-CM | POA: Diagnosis not present

## 2023-04-22 DIAGNOSIS — E872 Acidosis, unspecified: Secondary | ICD-10-CM | POA: Insufficient documentation

## 2023-04-22 DIAGNOSIS — I10 Essential (primary) hypertension: Secondary | ICD-10-CM | POA: Diagnosis present

## 2023-04-22 DIAGNOSIS — E782 Mixed hyperlipidemia: Secondary | ICD-10-CM | POA: Insufficient documentation

## 2023-04-22 DIAGNOSIS — E1165 Type 2 diabetes mellitus with hyperglycemia: Secondary | ICD-10-CM | POA: Diagnosis present

## 2023-04-22 DIAGNOSIS — E871 Hypo-osmolality and hyponatremia: Secondary | ICD-10-CM | POA: Diagnosis present

## 2023-04-22 DIAGNOSIS — N184 Chronic kidney disease, stage 4 (severe): Secondary | ICD-10-CM | POA: Diagnosis present

## 2023-04-22 DIAGNOSIS — J439 Emphysema, unspecified: Secondary | ICD-10-CM | POA: Diagnosis present

## 2023-04-22 DIAGNOSIS — Z7984 Long term (current) use of oral hypoglycemic drugs: Secondary | ICD-10-CM

## 2023-04-22 DIAGNOSIS — Z79899 Other long term (current) drug therapy: Secondary | ICD-10-CM

## 2023-04-22 DIAGNOSIS — Z91148 Patient's other noncompliance with medication regimen for other reason: Secondary | ICD-10-CM

## 2023-04-22 DIAGNOSIS — Z794 Long term (current) use of insulin: Secondary | ICD-10-CM

## 2023-04-22 DIAGNOSIS — E860 Dehydration: Secondary | ICD-10-CM | POA: Diagnosis present

## 2023-04-22 DIAGNOSIS — E66811 Obesity, class 1: Secondary | ICD-10-CM | POA: Diagnosis present

## 2023-04-22 DIAGNOSIS — E1122 Type 2 diabetes mellitus with diabetic chronic kidney disease: Secondary | ICD-10-CM | POA: Diagnosis present

## 2023-04-22 DIAGNOSIS — F17210 Nicotine dependence, cigarettes, uncomplicated: Secondary | ICD-10-CM | POA: Diagnosis present

## 2023-04-22 DIAGNOSIS — E669 Obesity, unspecified: Secondary | ICD-10-CM | POA: Diagnosis present

## 2023-04-22 DIAGNOSIS — Z8249 Family history of ischemic heart disease and other diseases of the circulatory system: Secondary | ICD-10-CM

## 2023-04-22 DIAGNOSIS — Z6833 Body mass index (BMI) 33.0-33.9, adult: Secondary | ICD-10-CM

## 2023-04-22 DIAGNOSIS — M7989 Other specified soft tissue disorders: Secondary | ICD-10-CM

## 2023-04-22 DIAGNOSIS — N179 Acute kidney failure, unspecified: Secondary | ICD-10-CM | POA: Diagnosis present

## 2023-04-22 DIAGNOSIS — R739 Hyperglycemia, unspecified: Secondary | ICD-10-CM

## 2023-04-22 DIAGNOSIS — E1100 Type 2 diabetes mellitus with hyperosmolarity without nonketotic hyperglycemic-hyperosmolar coma (NKHHC): Secondary | ICD-10-CM

## 2023-04-22 DIAGNOSIS — L02415 Cutaneous abscess of right lower limb: Secondary | ICD-10-CM | POA: Diagnosis present

## 2023-04-22 DIAGNOSIS — I129 Hypertensive chronic kidney disease with stage 1 through stage 4 chronic kidney disease, or unspecified chronic kidney disease: Secondary | ICD-10-CM | POA: Diagnosis present

## 2023-04-22 DIAGNOSIS — M726 Necrotizing fasciitis: Principal | ICD-10-CM | POA: Diagnosis present

## 2023-04-22 LAB — COMPREHENSIVE METABOLIC PANEL WITH GFR
ALT: 10 U/L (ref 0–44)
AST: 12 U/L — ABNORMAL LOW (ref 15–41)
Albumin: 2.8 g/dL — ABNORMAL LOW (ref 3.5–5.0)
Alkaline Phosphatase: 107 U/L (ref 38–126)
Anion gap: 15 (ref 5–15)
BUN: 26 mg/dL — ABNORMAL HIGH (ref 6–20)
CO2: 21 mmol/L — ABNORMAL LOW (ref 22–32)
Calcium: 9 mg/dL (ref 8.9–10.3)
Chloride: 90 mmol/L — ABNORMAL LOW (ref 98–111)
Creatinine, Ser: 2.05 mg/dL — ABNORMAL HIGH (ref 0.44–1.00)
GFR, Estimated: 29 mL/min — ABNORMAL LOW (ref >60)
Glucose, Bld: 537 mg/dL (ref 70–99)
Potassium: 3.7 mmol/L (ref 3.5–5.1)
Sodium: 126 mmol/L — ABNORMAL LOW (ref 135–145)
Total Bilirubin: 0.5 mg/dL (ref 0.0–1.2)
Total Protein: 7.5 g/dL (ref 6.5–8.1)

## 2023-04-22 LAB — CBC
HCT: 40.3 % (ref 36.0–46.0)
Hemoglobin: 13.5 g/dL (ref 12.0–15.0)
MCH: 31 pg (ref 26.0–34.0)
MCHC: 33.5 g/dL (ref 30.0–36.0)
MCV: 92.6 fL (ref 80.0–100.0)
Platelets: 323 10*3/uL (ref 150–400)
RBC: 4.35 MIL/uL (ref 3.87–5.11)
RDW: 13.4 % (ref 11.5–15.5)
WBC: 12.8 10*3/uL — ABNORMAL HIGH (ref 4.0–10.5)
nRBC: 0 % (ref 0.0–0.2)

## 2023-04-22 LAB — LACTIC ACID, PLASMA: Lactic Acid, Venous: 1.2 mmol/L (ref 0.5–1.9)

## 2023-04-22 LAB — CBG MONITORING, ED: Glucose-Capillary: 524 mg/dL (ref 70–99)

## 2023-04-22 MED ORDER — SODIUM CHLORIDE 0.9 % IV SOLN
2.0000 g | Freq: Once | INTRAVENOUS | Status: AC
  Administered 2023-04-23: 2 g via INTRAVENOUS
  Filled 2023-04-22: qty 20

## 2023-04-22 MED ORDER — SODIUM CHLORIDE 0.9 % IV BOLUS
1000.0000 mL | Freq: Once | INTRAVENOUS | Status: AC
  Administered 2023-04-23: 1000 mL via INTRAVENOUS

## 2023-04-22 MED ORDER — INSULIN ASPART 100 UNIT/ML IV SOLN
10.0000 [IU] | Freq: Once | INTRAVENOUS | Status: AC
  Administered 2023-04-23: 10 [IU] via INTRAVENOUS

## 2023-04-22 MED ORDER — HYDROMORPHONE HCL 1 MG/ML IJ SOLN
1.0000 mg | Freq: Once | INTRAMUSCULAR | Status: AC
  Administered 2023-04-23: 1 mg via INTRAVENOUS
  Filled 2023-04-22: qty 1

## 2023-04-22 NOTE — ED Triage Notes (Addendum)
 Pt has large swollen area on upper inner right thigh. Warm to touch and rred. Pt is also hyperglycemic with cbg 524 in triage

## 2023-04-23 ENCOUNTER — Encounter (HOSPITAL_COMMUNITY)
Admission: EM | Disposition: A | Discharge status: Home Health | Payer: Self-pay | Source: Home / Self Care | Attending: Internal Medicine

## 2023-04-23 ENCOUNTER — Inpatient Hospital Stay (HOSPITAL_COMMUNITY): Payer: Self-pay | Admitting: Certified Registered"

## 2023-04-23 ENCOUNTER — Encounter (HOSPITAL_COMMUNITY): Payer: Self-pay | Admitting: Internal Medicine

## 2023-04-23 DIAGNOSIS — N179 Acute kidney failure, unspecified: Secondary | ICD-10-CM | POA: Diagnosis not present

## 2023-04-23 DIAGNOSIS — I129 Hypertensive chronic kidney disease with stage 1 through stage 4 chronic kidney disease, or unspecified chronic kidney disease: Secondary | ICD-10-CM | POA: Diagnosis not present

## 2023-04-23 DIAGNOSIS — E1100 Type 2 diabetes mellitus with hyperosmolarity without nonketotic hyperglycemic-hyperosmolar coma (NKHHC): Secondary | ICD-10-CM | POA: Diagnosis not present

## 2023-04-23 DIAGNOSIS — E871 Hypo-osmolality and hyponatremia: Secondary | ICD-10-CM | POA: Diagnosis not present

## 2023-04-23 DIAGNOSIS — L02214 Cutaneous abscess of groin: Secondary | ICD-10-CM | POA: Diagnosis not present

## 2023-04-23 DIAGNOSIS — Z6833 Body mass index (BMI) 33.0-33.9, adult: Secondary | ICD-10-CM | POA: Diagnosis not present

## 2023-04-23 DIAGNOSIS — M726 Necrotizing fasciitis: Secondary | ICD-10-CM | POA: Diagnosis not present

## 2023-04-23 DIAGNOSIS — E66811 Obesity, class 1: Secondary | ICD-10-CM | POA: Diagnosis not present

## 2023-04-23 DIAGNOSIS — Z794 Long term (current) use of insulin: Secondary | ICD-10-CM | POA: Diagnosis not present

## 2023-04-23 DIAGNOSIS — Z87891 Personal history of nicotine dependence: Secondary | ICD-10-CM | POA: Diagnosis not present

## 2023-04-23 DIAGNOSIS — I10 Essential (primary) hypertension: Secondary | ICD-10-CM

## 2023-04-23 DIAGNOSIS — E669 Obesity, unspecified: Secondary | ICD-10-CM

## 2023-04-23 DIAGNOSIS — N184 Chronic kidney disease, stage 4 (severe): Secondary | ICD-10-CM

## 2023-04-23 DIAGNOSIS — L03115 Cellulitis of right lower limb: Secondary | ICD-10-CM | POA: Diagnosis not present

## 2023-04-23 DIAGNOSIS — E1122 Type 2 diabetes mellitus with diabetic chronic kidney disease: Secondary | ICD-10-CM | POA: Diagnosis not present

## 2023-04-23 DIAGNOSIS — Z8249 Family history of ischemic heart disease and other diseases of the circulatory system: Secondary | ICD-10-CM | POA: Diagnosis not present

## 2023-04-23 DIAGNOSIS — E782 Mixed hyperlipidemia: Secondary | ICD-10-CM

## 2023-04-23 DIAGNOSIS — E1165 Type 2 diabetes mellitus with hyperglycemia: Secondary | ICD-10-CM | POA: Diagnosis not present

## 2023-04-23 DIAGNOSIS — E872 Acidosis, unspecified: Secondary | ICD-10-CM | POA: Diagnosis not present

## 2023-04-23 DIAGNOSIS — L02415 Cutaneous abscess of right lower limb: Secondary | ICD-10-CM | POA: Diagnosis not present

## 2023-04-23 DIAGNOSIS — F17210 Nicotine dependence, cigarettes, uncomplicated: Secondary | ICD-10-CM | POA: Diagnosis present

## 2023-04-23 DIAGNOSIS — M7989 Other specified soft tissue disorders: Secondary | ICD-10-CM

## 2023-04-23 DIAGNOSIS — Z91148 Patient's other noncompliance with medication regimen for other reason: Secondary | ICD-10-CM | POA: Diagnosis not present

## 2023-04-23 DIAGNOSIS — Z79899 Other long term (current) drug therapy: Secondary | ICD-10-CM | POA: Diagnosis not present

## 2023-04-23 DIAGNOSIS — E860 Dehydration: Secondary | ICD-10-CM | POA: Diagnosis present

## 2023-04-23 DIAGNOSIS — Z7984 Long term (current) use of oral hypoglycemic drugs: Secondary | ICD-10-CM | POA: Diagnosis not present

## 2023-04-23 DIAGNOSIS — J439 Emphysema, unspecified: Secondary | ICD-10-CM | POA: Diagnosis not present

## 2023-04-23 DIAGNOSIS — R739 Hyperglycemia, unspecified: Secondary | ICD-10-CM | POA: Diagnosis not present

## 2023-04-23 HISTORY — PX: IRRIGATION AND DEBRIDEMENT OF NECROTIZING SOFT TISSUE INFECTION: SHX7317

## 2023-04-23 LAB — COMPREHENSIVE METABOLIC PANEL WITH GFR
ALT: 9 U/L (ref 0–44)
AST: 12 U/L — ABNORMAL LOW (ref 15–41)
Albumin: 2.3 g/dL — ABNORMAL LOW (ref 3.5–5.0)
Alkaline Phosphatase: 78 U/L (ref 38–126)
Anion gap: 13 (ref 5–15)
BUN: 25 mg/dL — ABNORMAL HIGH (ref 6–20)
CO2: 23 mmol/L (ref 22–32)
Calcium: 8.6 mg/dL — ABNORMAL LOW (ref 8.9–10.3)
Chloride: 93 mmol/L — ABNORMAL LOW (ref 98–111)
Creatinine, Ser: 2.3 mg/dL — ABNORMAL HIGH (ref 0.44–1.00)
GFR, Estimated: 25 mL/min — ABNORMAL LOW (ref >60)
Glucose, Bld: 372 mg/dL — ABNORMAL HIGH (ref 70–99)
Potassium: 4 mmol/L (ref 3.5–5.1)
Sodium: 129 mmol/L — ABNORMAL LOW (ref 135–145)
Total Bilirubin: 0.5 mg/dL (ref 0.0–1.2)
Total Protein: 6.5 g/dL (ref 6.5–8.1)

## 2023-04-23 LAB — LACTIC ACID, PLASMA
Lactic Acid, Venous: 2.1 mmol/L (ref 0.5–1.9)
Lactic Acid, Venous: 1.4 mmol/L (ref 0.5–1.9)
Lactic Acid, Venous: 1.3 mmol/L (ref 0.5–1.9)

## 2023-04-23 LAB — CBC
HCT: 36.2 % (ref 36.0–46.0)
Hemoglobin: 12.2 g/dL (ref 12.0–15.0)
MCH: 31.4 pg (ref 26.0–34.0)
MCHC: 33.7 g/dL (ref 30.0–36.0)
MCV: 93.3 fL (ref 80.0–100.0)
Platelets: 319 10*3/uL (ref 150–400)
RBC: 3.88 MIL/uL (ref 3.87–5.11)
RDW: 13.7 % (ref 11.5–15.5)
WBC: 16.5 10*3/uL — ABNORMAL HIGH (ref 4.0–10.5)
nRBC: 0 % (ref 0.0–0.2)

## 2023-04-23 LAB — SURGICAL PCR SCREEN
MRSA, PCR: NEGATIVE
Staphylococcus aureus: NEGATIVE

## 2023-04-23 LAB — GLUCOSE, CAPILLARY
Glucose-Capillary: 303 mg/dL — ABNORMAL HIGH (ref 70–99)
Glucose-Capillary: 322 mg/dL — ABNORMAL HIGH (ref 70–99)
Glucose-Capillary: 340 mg/dL — ABNORMAL HIGH (ref 70–99)
Glucose-Capillary: 278 mg/dL — ABNORMAL HIGH (ref 70–99)
Glucose-Capillary: 200 mg/dL — ABNORMAL HIGH (ref 70–99)

## 2023-04-23 LAB — PHOSPHORUS: Phosphorus: 3.5 mg/dL (ref 2.5–4.6)

## 2023-04-23 LAB — MAGNESIUM: Magnesium: 1.4 mg/dL — ABNORMAL LOW (ref 1.7–2.4)

## 2023-04-23 LAB — CBG MONITORING, ED: Glucose-Capillary: 372 mg/dL — ABNORMAL HIGH (ref 70–99)

## 2023-04-23 SURGERY — IRRIGATION AND DEBRIDEMENT OF NECROTIZING SOFT TISSUE INFECTION
Anesthesia: General | Site: Groin | Laterality: Right

## 2023-04-23 MED ORDER — CHLORHEXIDINE GLUCONATE 0.12 % MT SOLN
15.0000 mL | Freq: Once | OROMUCOSAL | Status: AC
  Administered 2023-04-23: 15 mL via OROMUCOSAL

## 2023-04-23 MED ORDER — DAKINS (1/4 STRENGTH) 0.125 % EX SOLN
Freq: Once | CUTANEOUS | Status: DC
  Filled 2023-04-23: qty 473

## 2023-04-23 MED ORDER — FENTANYL CITRATE PF 50 MCG/ML IJ SOSY
25.0000 ug | PREFILLED_SYRINGE | INTRAMUSCULAR | Status: DC | PRN
  Administered 2023-04-23: 50 ug via INTRAVENOUS
  Filled 2023-04-23: qty 1

## 2023-04-23 MED ORDER — FENTANYL CITRATE (PF) 250 MCG/5ML IJ SOLN
INTRAMUSCULAR | Status: DC | PRN
  Administered 2023-04-23 (×2): 50 ug via INTRAVENOUS

## 2023-04-23 MED ORDER — SODIUM CHLORIDE 0.9 % IV SOLN: 12.5000 mg | INTRAVENOUS | Status: DC | PRN

## 2023-04-23 MED ORDER — DEXMEDETOMIDINE HCL IN NACL 80 MCG/20ML IV SOLN
INTRAVENOUS | Status: AC
  Filled 2023-04-23: qty 20

## 2023-04-23 MED ORDER — INSULIN GLARGINE-YFGN 100 UNIT/ML ~~LOC~~ SOLN
10.0000 [IU] | Freq: Every day | SUBCUTANEOUS | Status: DC
  Administered 2023-04-23: 10 [IU] via SUBCUTANEOUS
  Filled 2023-04-23 (×2): qty 0.1

## 2023-04-23 MED ORDER — ONDANSETRON HCL 4 MG/2ML IJ SOLN
INTRAMUSCULAR | Status: AC
  Filled 2023-04-23: qty 2

## 2023-04-23 MED ORDER — PHENYLEPHRINE 80 MCG/ML (10ML) SYRINGE FOR IV PUSH (FOR BLOOD PRESSURE SUPPORT)
PREFILLED_SYRINGE | INTRAVENOUS | Status: DC | PRN
Start: 2023-04-23 — End: 2023-04-23
  Administered 2023-04-23: 80 ug via INTRAVENOUS

## 2023-04-23 MED ORDER — INSULIN ASPART 100 UNIT/ML IJ SOLN: 0.0000 [IU] | Freq: Three times a day (TID) | INTRAMUSCULAR | Status: DC

## 2023-04-23 MED ORDER — ONDANSETRON HCL 4 MG PO TABS: 4.0000 mg | ORAL_TABLET | Freq: Four times a day (QID) | ORAL | Status: DC | PRN

## 2023-04-23 MED ORDER — ROCURONIUM BROMIDE 10 MG/ML (PF) SYRINGE
PREFILLED_SYRINGE | INTRAVENOUS | Status: AC
  Filled 2023-04-23: qty 10

## 2023-04-23 MED ORDER — CLINDAMYCIN PHOSPHATE 600 MG/50ML IV SOLN
600.0000 mg | Freq: Once | INTRAVENOUS | Status: AC
  Administered 2023-04-23: 600 mg via INTRAVENOUS
  Filled 2023-04-23: qty 50

## 2023-04-23 MED ORDER — MIDAZOLAM HCL 2 MG/2ML IJ SOLN
INTRAMUSCULAR | Status: AC
  Filled 2023-04-23: qty 2

## 2023-04-23 MED ORDER — BUPIVACAINE HCL (PF) 0.5 % IJ SOLN
INTRAMUSCULAR | Status: AC
  Filled 2023-04-23: qty 30

## 2023-04-23 MED ORDER — DAKINS (1/4 STRENGTH) 0.125 % EX SOLN
CUTANEOUS | Status: DC | PRN
  Administered 2023-04-23: 1

## 2023-04-23 MED ORDER — LACTATED RINGERS IV SOLN
INTRAVENOUS | Status: DC
Start: 2023-04-23 — End: 2023-04-23
  Administered 2023-04-23: 1000 mL via INTRAVENOUS

## 2023-04-23 MED ORDER — INSULIN ASPART 100 UNIT/ML IJ SOLN: 0.0000 [IU] | INTRAMUSCULAR | Status: DC

## 2023-04-23 MED ORDER — PIPERACILLIN-TAZOBACTAM 3.375 G IVPB 30 MIN
3.3750 g | Freq: Once | INTRAVENOUS | Status: AC
  Administered 2023-04-23: 3.375 g via INTRAVENOUS
  Filled 2023-04-23: qty 50

## 2023-04-23 MED ORDER — LACTATED RINGERS IV SOLN: INTRAVENOUS | Status: DC

## 2023-04-23 MED ORDER — OXYCODONE HCL 5 MG PO TABS: 5.0000 mg | ORAL_TABLET | Freq: Once | ORAL | Status: DC | PRN

## 2023-04-23 MED ORDER — ATORVASTATIN CALCIUM 10 MG PO TABS
20.0000 mg | ORAL_TABLET | Freq: Every day | ORAL | Status: DC
  Administered 2023-04-24 – 2023-04-27 (×4): 20 mg via ORAL
  Filled 2023-04-23: qty 2
  Filled 2023-04-23: qty 1
  Filled 2023-04-23 (×3): qty 2

## 2023-04-23 MED ORDER — ACETAMINOPHEN 650 MG RE SUPP: 650.0000 mg | Freq: Four times a day (QID) | RECTAL | Status: DC | PRN

## 2023-04-23 MED ORDER — PROPOFOL 10 MG/ML IV BOLUS
INTRAVENOUS | Status: AC
  Filled 2023-04-23: qty 20

## 2023-04-23 MED ORDER — MAGNESIUM SULFATE 2 GM/50ML IV SOLN: 2.0000 g | Freq: Once | INTRAVENOUS | Status: DC

## 2023-04-23 MED ORDER — INSULIN GLARGINE-YFGN 100 UNIT/ML ~~LOC~~ SOLN: 10.0000 [IU] | Freq: Every day | SUBCUTANEOUS | Status: DC

## 2023-04-23 MED ORDER — OXYCODONE HCL 5 MG/5ML PO SOLN: 5.0000 mg | Freq: Once | ORAL | Status: DC | PRN

## 2023-04-23 MED ORDER — SCOPOLAMINE 1 MG/3DAYS TD PT72
MEDICATED_PATCH | TRANSDERMAL | Status: AC
  Filled 2023-04-23: qty 1

## 2023-04-23 MED ORDER — ONDANSETRON HCL 4 MG/2ML IJ SOLN: 4.0000 mg | Freq: Four times a day (QID) | INTRAMUSCULAR | Status: DC | PRN

## 2023-04-23 MED ORDER — INSULIN ASPART 100 UNIT/ML IJ SOLN
0.0000 [IU] | Freq: Three times a day (TID) | INTRAMUSCULAR | Status: DC
  Administered 2023-04-23: 10 [IU] via SUBCUTANEOUS
  Administered 2023-04-23 – 2023-04-24 (×2): 8 [IU] via SUBCUTANEOUS
  Administered 2023-04-24: 16 [IU] via SUBCUTANEOUS
  Administered 2023-04-25: 3 [IU] via SUBCUTANEOUS
  Administered 2023-04-25: 5 [IU] via SUBCUTANEOUS
  Administered 2023-04-26 – 2023-04-27 (×4): 3 [IU] via SUBCUTANEOUS
  Administered 2023-04-27: 5 [IU] via SUBCUTANEOUS

## 2023-04-23 MED ORDER — INSULIN ASPART 100 UNIT/ML IJ SOLN
10.0000 [IU] | Freq: Once | INTRAMUSCULAR | Status: AC
  Administered 2023-04-23: 10 [IU] via SUBCUTANEOUS

## 2023-04-23 MED ORDER — ORAL CARE MOUTH RINSE: 15.0000 mL | Freq: Once | OROMUCOSAL | Status: AC

## 2023-04-23 MED ORDER — AMLODIPINE BESYLATE 5 MG PO TABS: 5.0000 mg | ORAL_TABLET | Freq: Every day | ORAL | Status: DC

## 2023-04-23 MED ORDER — ROCURONIUM BROMIDE 10 MG/ML (PF) SYRINGE
PREFILLED_SYRINGE | INTRAVENOUS | Status: DC | PRN
  Administered 2023-04-23: 30 mg via INTRAVENOUS

## 2023-04-23 MED ORDER — PHENYLEPHRINE 80 MCG/ML (10ML) SYRINGE FOR IV PUSH (FOR BLOOD PRESSURE SUPPORT)
PREFILLED_SYRINGE | INTRAVENOUS | Status: AC
  Filled 2023-04-23: qty 10

## 2023-04-23 MED ORDER — SCOPOLAMINE 1 MG/3DAYS TD PT72
1.0000 | MEDICATED_PATCH | Freq: Once | TRANSDERMAL | Status: DC
  Administered 2023-04-23: 1.5 mg via TRANSDERMAL

## 2023-04-23 MED ORDER — DEXMEDETOMIDINE HCL IN NACL 80 MCG/20ML IV SOLN
INTRAVENOUS | Status: DC | PRN
Start: 2023-04-23 — End: 2023-04-23
  Administered 2023-04-23 (×2): 8 ug via INTRAVENOUS

## 2023-04-23 MED ORDER — ACETAMINOPHEN 325 MG PO TABS
650.0000 mg | ORAL_TABLET | Freq: Four times a day (QID) | ORAL | Status: DC | PRN
  Administered 2023-04-23 – 2023-04-27 (×4): 650 mg via ORAL
  Filled 2023-04-23 (×4): qty 2

## 2023-04-23 MED ORDER — SUGAMMADEX SODIUM 200 MG/2ML IV SOLN
INTRAVENOUS | Status: DC | PRN
  Administered 2023-04-23: 200 mg via INTRAVENOUS

## 2023-04-23 MED ORDER — INSULIN GLARGINE-YFGN 100 UNIT/ML ~~LOC~~ SOLN
10.0000 [IU] | Freq: Every day | SUBCUTANEOUS | Status: DC
  Filled 2023-04-23 (×2): qty 0.1

## 2023-04-23 MED ORDER — LIDOCAINE HCL (PF) 2 % IJ SOLN
INTRAMUSCULAR | Status: DC | PRN
  Administered 2023-04-23: 100 mg via INTRADERMAL

## 2023-04-23 MED ORDER — MIDAZOLAM HCL 2 MG/2ML IJ SOLN
INTRAMUSCULAR | Status: DC | PRN
  Administered 2023-04-23: 2 mg via INTRAVENOUS

## 2023-04-23 MED ORDER — SODIUM CHLORIDE 0.9 % IR SOLN
Status: DC | PRN
  Administered 2023-04-23 (×2): 1000 mL

## 2023-04-23 MED ORDER — INSULIN ASPART 100 UNIT/ML IJ SOLN
0.0000 [IU] | Freq: Every day | INTRAMUSCULAR | Status: DC
  Administered 2023-04-25: 2 [IU] via SUBCUTANEOUS

## 2023-04-23 MED ORDER — MUPIROCIN 2 % EX OINT: 1.0000 | TOPICAL_OINTMENT | Freq: Two times a day (BID) | CUTANEOUS | Status: DC

## 2023-04-23 MED ORDER — PIPERACILLIN-TAZOBACTAM 3.375 G IVPB
3.3750 g | Freq: Three times a day (TID) | INTRAVENOUS | Status: DC
  Administered 2023-04-23 – 2023-04-24 (×3): 3.375 g via INTRAVENOUS
  Filled 2023-04-23 (×4): qty 50

## 2023-04-23 MED ORDER — LACTATED RINGERS IV BOLUS
1000.0000 mL | Freq: Once | INTRAVENOUS | Status: AC
  Administered 2023-04-23: 1000 mL via INTRAVENOUS

## 2023-04-23 MED ORDER — SUCCINYLCHOLINE CHLORIDE 200 MG/10ML IV SOSY
PREFILLED_SYRINGE | INTRAVENOUS | Status: AC
  Filled 2023-04-23: qty 10

## 2023-04-23 MED ORDER — SUGAMMADEX SODIUM 200 MG/2ML IV SOLN
INTRAVENOUS | Status: AC
  Filled 2023-04-23: qty 2

## 2023-04-23 MED ORDER — LACTATED RINGERS IV SOLN: INTRAVENOUS | Status: AC

## 2023-04-23 MED ORDER — POVIDONE-IODINE 10 % EX OINT
TOPICAL_OINTMENT | CUTANEOUS | Status: AC
  Filled 2023-04-23: qty 28.4

## 2023-04-23 MED ORDER — LINEZOLID 600 MG/300ML IV SOLN
600.0000 mg | Freq: Two times a day (BID) | INTRAVENOUS | Status: DC
  Administered 2023-04-23 – 2023-04-25 (×5): 600 mg via INTRAVENOUS
  Filled 2023-04-23 (×11): qty 300

## 2023-04-23 MED ORDER — SUCCINYLCHOLINE CHLORIDE 200 MG/10ML IV SOSY
PREFILLED_SYRINGE | INTRAVENOUS | Status: DC | PRN
  Administered 2023-04-23: 120 mg via INTRAVENOUS

## 2023-04-23 MED ORDER — FENTANYL CITRATE (PF) 250 MCG/5ML IJ SOLN
INTRAMUSCULAR | Status: AC
  Filled 2023-04-23: qty 5

## 2023-04-23 MED ORDER — DEXAMETHASONE SODIUM PHOSPHATE 10 MG/ML IJ SOLN
INTRAMUSCULAR | Status: AC
  Filled 2023-04-23: qty 1

## 2023-04-23 MED ORDER — LIDOCAINE HCL (PF) 2 % IJ SOLN
INTRAMUSCULAR | Status: AC
  Filled 2023-04-23: qty 5

## 2023-04-23 MED ORDER — INSULIN ASPART 100 UNIT/ML IJ SOLN: 0.0000 [IU] | Freq: Every day | INTRAMUSCULAR | Status: DC

## 2023-04-23 MED ORDER — ONDANSETRON HCL 4 MG/2ML IJ SOLN
INTRAMUSCULAR | Status: DC | PRN
Start: 2023-04-23 — End: 2023-04-23
  Administered 2023-04-23: 4 mg via INTRAVENOUS

## 2023-04-23 MED ORDER — PROPOFOL 10 MG/ML IV BOLUS
INTRAVENOUS | Status: DC | PRN
  Administered 2023-04-23: 150 mg via INTRAVENOUS

## 2023-04-23 SURGICAL SUPPLY — 25 items
BNDG CONFORM 2 STRL LF (GAUZE/BANDAGES/DRESSINGS) ×1 IMPLANT
BNDG GAUZE DERMACEA FLUFF 4 (GAUZE/BANDAGES/DRESSINGS) IMPLANT
CLOTH BEACON ORANGE TIMEOUT ST (SAFETY) ×1 IMPLANT
COVER LIGHT HANDLE STERIS (MISCELLANEOUS) ×2 IMPLANT
ELECT REM PT RETURN 9FT ADLT (ELECTROSURGICAL) ×1 IMPLANT
ELECTRODE REM PT RTRN 9FT ADLT (ELECTROSURGICAL) ×1 IMPLANT
GAUZE SPONGE 4X4 12PLY STRL (GAUZE/BANDAGES/DRESSINGS) ×2 IMPLANT
GLOVE BIOGEL PI IND STRL 6.5 (GLOVE) ×1 IMPLANT
GLOVE BIOGEL PI IND STRL 7.0 (GLOVE) ×2 IMPLANT
GLOVE SURG SS PI 6.5 STRL IVOR (GLOVE) ×1 IMPLANT
GOWN STRL REUS W/TWL LRG LVL3 (GOWN DISPOSABLE) ×2 IMPLANT
KIT TURNOVER KIT A (KITS) ×1 IMPLANT
MANIFOLD NEPTUNE II (INSTRUMENTS) ×1 IMPLANT
NS IRRIG 1000ML POUR BTL (IV SOLUTION) ×1 IMPLANT
PACK MINOR (CUSTOM PROCEDURE TRAY) ×1 IMPLANT
PAD ABD 5X9 TENDERSORB (GAUZE/BANDAGES/DRESSINGS) IMPLANT
PAD ARMBOARD POSITIONER FOAM (MISCELLANEOUS) ×1 IMPLANT
POSITIONER HEAD 8X9X4 ADT (SOFTGOODS) ×1 IMPLANT
SET BASIN LINEN APH (SET/KITS/TRAYS/PACK) ×1 IMPLANT
SET HNDPC FAN SPRY TIP SCT (DISPOSABLE) IMPLANT
SOL PREP PROV IODINE SCRUB 4OZ (MISCELLANEOUS) ×1 IMPLANT
SPONGE T-LAP 18X18 ~~LOC~~+RFID (SPONGE) IMPLANT
SUT VIC AB 3-0 SH 27X BRD (SUTURE) IMPLANT
SWAB CULTURE ESWAB REG 1ML (MISCELLANEOUS) IMPLANT
SYR BULB IRRIG 60ML STRL (SYRINGE) ×1 IMPLANT

## 2023-04-23 NOTE — Anesthesia Postprocedure Evaluation (Signed)
 Anesthesia Post Note  Patient: INOCENCIA MURTAUGH  Procedure(s) Performed: IRRIGATION, DRAINAGE AND DEBRIDEMENT OF NECROTIZING SOFT TISSUE INFECTION RIGHT GROIN DOWN TO MUSCLE, 11CM X 12CM X 5CM (Right: Groin)  Patient location during evaluation: PACU Anesthesia Type: General Level of consciousness: awake and alert Pain management: pain level controlled Vital Signs Assessment: post-procedure vital signs reviewed and stable Respiratory status: spontaneous breathing, nonlabored ventilation, respiratory function stable and patient connected to nasal cannula oxygen Cardiovascular status: blood pressure returned to baseline and stable Postop Assessment: no apparent nausea or vomiting Anesthetic complications: no   There were no known notable events for this encounter.   Last Vitals:  Vitals:   04/23/23 1100 04/23/23 1117  BP: 115/82 115/82  Pulse:  86  Resp:  20  Temp:    SpO2:  98%    Last Pain:  Vitals:   04/23/23 1057  TempSrc:   PainSc: Asleep                 Treyton Slimp L Garin Mata

## 2023-04-23 NOTE — Progress Notes (Signed)
   04/23/23 1035  TOC Brief Assessment  Insurance and Status Lapsed  Patient has primary care physician Yes  Home environment has been reviewed Home with mother  Prior level of function: Independent  Prior/Current Home Services No current home services  Social Drivers of Health Review SDOH reviewed no interventions necessary  Readmission risk has been reviewed Yes  Transition of care needs no transition of care needs at this time   Transition of Care Department [facility]) has reviewed patient and no TOC needs have been identified at this time. We will continue to monitor patient advancement through interdisciplinary progression rounds. If new patient transition needs arise, please place a TOC consult.

## 2023-04-23 NOTE — Hospital Course (Signed)
 53 year old female with a history of diabetes mellitus type 2, hypertension, CKD stage III, tobacco abuse presenting with right medial thigh pain.  The patient states that she noticed what she felt to be a "hair bump" starting on 04/19/2023.  She stated that over the next few days, the area increased in size with some edema and pain.  She used some Neosporin without much improvement.  She denied any fevers, chills, chest pain, shortness breath, cough, hemoptysis.  She states that she has not completely compliant with her insulin.  She continues to smoke cigarettes. In the ED, the patient had low-grade temperature.  She was initially tachycardic in 110s.  She remained hemodynamically stable.  WBC 12.8, hemoglobin 13.5, platelets 319.  Sodium 126, potassium 3.7, bicarbonate 21, serum creatinine 2.05.  LFTs unremarkable.  CT of the pelvis showed soft tissue edema and emphysema in the right proximal medial thigh concerning for possible necrotizing infection.  General surgery was consulted assist with management.  The patient was started on Zosyn, linezolid, and clindamycin.

## 2023-04-23 NOTE — Progress Notes (Signed)
 Patient OTF to OR for procedure

## 2023-04-23 NOTE — Progress Notes (Signed)
 [facility]  Called by ED provider regarding patient.  She is a 53 year old female who noticed a small bump that appeared on her skin 24 hours ago, and subsequently has gotten significantly worse.  She was noted to be tachycardic but otherwise hemodynamically stable.  She has a mild leukocytosis of 12.8.  She has significant hyperglycemia with a blood sugar of 524.  She underwent a CT of the pelvis which demonstrates subcutaneous soft tissue edema and emphysema of the right proximal medial thigh with some possible underlying myositis concerning for necrotizing soft tissue infection.  Patient will need incision, drainage, and debridement of the area in the operating room.  Recommend resuscitation with IV fluids and antibiotics.  Will plan for operative debridement later this morning.  She will be admitted to the hospitalist service.  Please call with any questions or concerns.  Theophilus Kinds, DO [facility] [address] [place], [place] [address] [phone] (office)

## 2023-04-23 NOTE — ED Provider Notes (Signed)
 [facility] MEDICAL SURGICAL UNIT Provider Note   CSN: [id number] Arrival date & time: 04/22/23  2111     History  Chief Complaint  Patient presents with   Abscess    Megan Mullins is a 53 y.o. female.  53 yo F w/ h/o IDDM who presents with one day of rapid worsening pain and swelling to right upper medial thigh. No fevers. No drainage, did start as a 'small bump' a couple days ago but got a lot worse today. CBG's high.    Abscess      Home Medications Prior to Admission medications   Medication Sig Start Date End Date Taking? Authorizing Provider  amLODipine (NORVASC) 5 MG tablet Take 1 tablet (5 mg total) by mouth daily. 11/21/22   Catarina Hartshorn, MD  atorvastatin (LIPITOR) 20 MG tablet Take 20 mg by mouth daily. 07/19/22   [provider]  HYDROcodone-acetaminophen (NORCO/VICODIN) 5-325 MG tablet Take 1 tablet by mouth every 6 (six) hours as needed for moderate pain (pain score 4-6). 11/21/22   Catarina Hartshorn, MD  insulin aspart (NOVOLOG) 100 UNIT/ML injection Inject 0-15 Units into the skin 3 (three) times daily with meals. CBG 70 - 120: 0 units CBG 121 - 150: 0 units CBG 151 - 200: 0 units CBG 201 - 250: 2 units CBG 251 - 300: 3 units CBG 301 - 350: 4 units CBG 351 - 400: 5 units 02/03/22   Pahwani, Daleen Bo, MD  insulin glargine (LANTUS SOLOSTAR) 100 UNIT/ML Solostar Pen Inject 25 Units into the skin 2 (two) times daily. Patient taking differently: Inject 25 Units into the skin at bedtime. 02/03/22   Hughie Closs, MD  metFORMIN (GLUCOPHAGE) 1000 MG tablet Take 1 tablet (1,000 mg total) by mouth 2 (two) times daily with a meal. 02/03/22 11/20/22  Hughie Closs, MD      Allergies    Patient has no known allergies.    Review of Systems   Review of Systems  Physical Exam Updated Vital Signs BP 105/75 (BP Location: Left Arm)   Pulse (!) 103   Temp 98.5 F (36.9 C) (Oral)   Resp 19   Ht 5' (1.524 m)   Wt 77.5 kg   SpO2 93%   BMI 33.37 kg/m  Physical Exam Vitals and  nursing note reviewed.  Constitutional:      Appearance: She is well-developed.  HENT:     Head: Normocephalic and atraumatic.  Cardiovascular:     Rate and Rhythm: Normal rate and regular rhythm.  Pulmonary:     Effort: No respiratory distress.     Breath sounds: No stridor.  Abdominal:     General: There is no distension.  Musculoskeletal:     Cervical back: Normal range of motion.  Skin:    Comments: NT chaperoned exam, patient with large area of induration, ttp, swelling and warmth to upper right thigh. No crepitus. No fluctuance. Doesn't appear to cross to pubic area.   Neurological:     Mental Status: She is alert.     ED Results / Procedures / Treatments   Labs (all labs ordered are listed, but only abnormal results are displayed) Labs Reviewed  CBC - Abnormal; Notable for the following components:      Result Value   WBC 12.8 (*)    All other components within normal limits  COMPREHENSIVE METABOLIC PANEL WITH GFR - Abnormal; Notable for the following components:   Sodium 126 (*)    Chloride 90 (*)  CO2 21 (*)    Glucose, Bld 537 (*)    BUN 26 (*)    Creatinine, Ser 2.05 (*)    Albumin 2.8 (*)    AST 12 (*)    GFR, Estimated 29 (*)    All other components within normal limits  LACTIC ACID, PLASMA - Abnormal; Notable for the following components:   Lactic Acid, Venous 2.1 (*)    All other components within normal limits  CBG MONITORING, ED - Abnormal; Notable for the following components:   Glucose-Capillary 524 (*)    All other components within normal limits  CBG MONITORING, ED - Abnormal; Notable for the following components:   Glucose-Capillary 372 (*)    All other components within normal limits  CULTURE, BLOOD (ROUTINE X 2)  CULTURE, BLOOD (ROUTINE X 2)  SURGICAL PCR SCREEN  LACTIC ACID, PLASMA    EKG None  Radiology CT PELVIS WO CONTRAST Result Date: 04/23/2023 CLINICAL DATA:  evaluate for soft tissue infection right upper medial thigh/pubic  EXAM: CT PELVIS WITHOUT CONTRAST TECHNIQUE: Multidetector CT imaging of the pelvis was performed following the standard protocol without intravenous contrast. RADIATION DOSE REDUCTION: This exam was performed according to the departmental dose-optimization program which includes automated exposure control, adjustment of the mA and/or kV according to patient size and/or use of iterative reconstruction technique. COMPARISON:  CT abdomen pelvis 07/28/2021 FINDINGS: Urinary Tract:  No abnormality visualized. Bowel: Colonic diverticulosis. Other unremarkable visualized pelvic bowel loops. Vascular/Lymphatic: Atherosclerotic plaque. No pathologically enlarged lymph nodes. No significant vascular abnormality seen. Reproductive:  No mass or other significant abnormality Other: No intraperitoneal free fluid. No intraperitoneal free gas. No organized fluid collection. Musculoskeletal: Subcutaneus soft tissue edema and emphysema of the right proximal medial thigh. Asymmetric enlargement of the right gracilis. Associated perimuscular fat stranding. No organized fluid collection. No suspicious lytic or blastic osseous lesions. No acute displaced fracture. Severe degenerative changes of bilateral hips. IMPRESSION: 1. Subcutaneus soft tissue edema and emphysema of the right proximal medial thigh. Asymmetric enlargement of the right gracilis-finding could represent underlying hematoma or myositis. Correlate with clinical findings for necrotizing fasciitis. 2. Severe degenerative changes of bilateral hips. Electronically Signed   By: Tish Frederickson M.D.   On: 04/23/2023 01:07    Procedures .Critical Care  Performed by: Marily Memos, MD Authorized by: Marily Memos, MD   Critical care provider statement:    Critical care time (minutes):  30   Critical care was necessary to treat or prevent imminent or life-threatening deterioration of the following conditions:  Sepsis   Critical care was time spent personally by me on  the following activities:  Development of treatment plan with patient or surrogate, discussions with consultants, evaluation of patient's response to treatment, examination of patient, ordering and review of laboratory studies, ordering and review of radiographic studies, ordering and performing treatments and interventions, pulse oximetry, re-evaluation of patient's condition and review of old charts     Medications Ordered in ED Medications  linezolid (ZYVOX) IVPB 600 mg (0 mg Intravenous Stopped 04/23/23 0300)  mupirocin ointment (BACTROBAN) 2 % 1 Application (has no administration in time range)  cefTRIAXone (ROCEPHIN) 2 g in sodium chloride 0.9 % 100 mL IVPB (0 g Intravenous Stopped 04/23/23 0127)  sodium chloride 0.9 % bolus 1,000 mL (0 mLs Intravenous Stopped 04/23/23 0152)  insulin aspart (novoLOG) injection 10 Units (10 Units Intravenous Given 04/23/23 0026)  HYDROmorphone (DILAUDID) injection 1 mg (1 mg Intravenous Given 04/23/23 0024)  piperacillin-tazobactam (ZOSYN) IVPB 3.375 g (  0 g Intravenous Stopped 04/23/23 0152)  clindamycin (CLEOCIN) IVPB 600 mg ( Intravenous Restarted 04/23/23 0428)  lactated ringers bolus 1,000 mL (0 mLs Intravenous Stopped 04/23/23 0424)    ED Course/ Medical Decision Making/ A&P                                 Medical Decision Making Amount and/or Complexity of Data Reviewed Labs: ordered. Radiology: ordered.  Risk OTC drugs. Prescription drug management. Decision regarding hospitalization.   Hyperglycemia - fluids, insulin, likely related to infection, improved.   Aki - likely related to infection, hyperglycemia, fluids given.   Ct pelvis wo contrast done 2/2 AKI and shows significant sub q gas on my intepretation, rads read reviewed and no obvious fluid collection. Concern for nec fasc which would explain rapid worsening, hyperglycemia, leukocytosis. Initially (right after exam) had ordered rocephin as it seemed to be a simple cellulitis. After CT d/w  pharmacy who recommended adding zosyn and linezolid.   D/w Dr. Robyne Peers who feels secondary to stability would be ok to stay here for OR in AM, asked to add clindamycin. Discussed pharmacy recommendations, but added on clindamycin per request. Fluids given, HR improving and cbg improving.   D/w Dr. Thomes Dinning with [facility] for admit.    Final Clinical Impression(s) / ED Diagnoses Final diagnoses:  Cellulitis of right lower extremity  Hyperglycemia    Rx / DC Orders ED Discharge Orders     None         Jazmine Longshore, Barbara Cower, MD 04/23/23 [phone]

## 2023-04-23 NOTE — Progress Notes (Signed)
 PROGRESS NOTE  Megan Mullins GNF:[id number] DOB: [birth year] DOA: 04/22/2023 PCP: Benetta Spar, MD  Brief History:  53 year old female with a history of diabetes mellitus type 2, hypertension, CKD stage III, tobacco abuse presenting with right medial thigh pain.  The patient states that she noticed what she felt to be a "hair bump" starting on 04/19/2023.  She stated that over the next few days, the area increased in size with some edema and pain.  She used some Neosporin without much improvement.  She denied any fevers, chills, chest pain, shortness breath, cough, hemoptysis.  She states that she has not completely compliant with her insulin.  She continues to smoke cigarettes. In the ED, the patient had low-grade temperature.  She was initially tachycardic in 110s.  She remained hemodynamically stable.  WBC 12.8, hemoglobin 13.5, platelets 319.  Sodium 126, potassium 3.7, bicarbonate 21, serum creatinine 2.05.  LFTs unremarkable.  CT of the pelvis showed soft tissue edema and emphysema in the right proximal medial thigh concerning for possible necrotizing infection.  General surgery was consulted assist with management.  The patient was started on Zosyn, linezolid, and clindamycin.   Assessment/Plan: Cellulitis/abscess of right thigh -General Surgery consult appreciated -Continue linezolid and Zosyn -Remain n.p.o. for surgical debridement -Judicious opioids -Follow cultures  Hyperosmolar nonketotic state -Patient presented with serum glucose 537 -She improved with subcutaneous insulin and fluid resuscitation -Continue IV fluids -Monitor BMP  Uncontrolled diabetes mellitus type 2 with hyperglycemia -11/21/2022 hemoglobin A1c >15.5 -Start reduced dose Semglee -NovoLog sliding scale -Holding metformin  Acute on chronic renal failure --CKD stage IIIb -Baseline creatinine 1.5-1.6 -Serum creatinine peaking 2.30 -Continue fluid resuscitation -Renal ultrasound -Suspect the  patient may have progression of her underlying CKD  Hypomagnesemia -Replete  Essential hypertension -Holding amlodipine secondary to soft blood pressure  Tobacco abuse -Tobacco cessation discussed  Hyponatremia -Secondary to volume depletion and hyperglycemia  Mixed hyperlipidemia -Continue statin  Obesity -BMI 33.37 -lifestyle modification      Family Communication:  no Family at bedside  Consultants:  general surgery  Code Status:  FULL   DVT Prophylaxis:  [place] Heparin   Procedures: As Listed in Progress Note Above  Antibiotics: Zyvox 4/6>> Zosyn 4/6>>   Total time spent 50 minutes.  Greater than 50% spent face to face counseling and coordinating care.   Subjective: Patient denies fevers, chills, headache, chest pain, dyspnea, nausea, vomiting, diarrhea, abdominal pain, dysuria, hematuria, hematochezia, and melena. She complains of pain in the right thigh area.  Objective: Vitals:   04/23/23 0107 04/23/23 0151 04/23/23 0302 04/23/23 0313  BP:  117/81  105/75  Pulse: (!) 115 (!) 112  (!) 103  Resp: 17 20  19   Temp:    98.5 F (36.9 C)  TempSrc:    Oral  SpO2: 95% 91%  93%  Weight:   77.5 kg   Height:   5' (1.524 m)     Intake/Output Summary (Last 24 hours) at 04/23/2023 0741 Last data filed at 04/23/2023 8469 Gross per 24 hour  Intake 1400 ml  Output --  Net 1400 ml   Weight change:  Exam:  General:  Pt is alert, follows commands appropriately, not in acute distress HEENT: No icterus, No thrush, No neck mass, [place]/AT Cardiovascular: RRR, S1/S2, no rubs, no gallops Respiratory: CTA bilaterally, no wheezing, no crackles, no rhonchi Abdomen: Soft/+BS, non tender, non distended, no guarding Extremities: No edema, No lymphangitis, No petechiae, No rashes,  no synovitis.  Right medial thigh induration and edema and pain.  There is no crepitance.  There is no necrosis.   Data Reviewed: I have personally reviewed following labs and imaging  studies Basic Metabolic Panel: Recent Labs  Lab 04/22/23 2205 04/23/23 0603  NA 126* 129*  K 3.7 4.0  CL 90* 93*  CO2 21* 23  GLUCOSE 537* 372*  BUN 26* 25*  CREATININE 2.05* 2.30*  CALCIUM 9.0 8.6*  MG  --  1.4*  PHOS  --  3.5   Liver Function Tests: Recent Labs  Lab 04/22/23 2205 04/23/23 0603  AST 12* 12*  ALT 10 9  ALKPHOS 107 78  BILITOT 0.5 0.5  PROT 7.5 6.5  ALBUMIN 2.8* 2.3*   No results for input(s): "LIPASE", "AMYLASE" in the last 168 hours. No results for input(s): "AMMONIA" in the last 168 hours. Coagulation Profile: No results for input(s): "INR", "PROTIME" in the last 168 hours. CBC: Recent Labs  Lab 04/22/23 2205 04/23/23 0603  WBC 12.8* 16.5*  HGB 13.5 12.2  HCT 40.3 36.2  MCV 92.6 93.3  PLT 323 319   Cardiac Enzymes: No results for input(s): "CKTOTAL", "CKMB", "CKMBINDEX", "TROPONINI" in the last 168 hours. BNP: Invalid input(s): "POCBNP" CBG: Recent Labs  Lab 04/22/23 2139 04/23/23 0129  GLUCAP 524* 372*   HbA1C: No results for input(s): "HGBA1C" in the last 72 hours. Urine analysis:    Component Value Date/Time   COLORURINE STRAW (A) 11/20/2022 1538   APPEARANCEUR CLEAR 11/20/2022 1538   LABSPEC 1.021 11/20/2022 1538   PHURINE 7.0 11/20/2022 1538   GLUCOSEU >=500 (A) 11/20/2022 1538   HGBUR MODERATE (A) 11/20/2022 1538   BILIRUBINUR NEGATIVE 11/20/2022 1538   KETONESUR NEGATIVE 11/20/2022 1538   PROTEINUR 100 (A) 11/20/2022 1538   UROBILINOGEN 0.2 03/13/2014 1940   NITRITE NEGATIVE 11/20/2022 1538   LEUKOCYTESUR NEGATIVE 11/20/2022 1538   Sepsis Labs: @LABRCNTIP (procalcitonin:4,lacticidven:4) ) Recent Results (from the past 240 hours)  Surgical PCR screen     Status: None   Collection Time: 04/23/23  3:52 AM   Specimen: Nasal Mucosa; Nasal Swab  Result Value Ref Range Status   MRSA, PCR NEGATIVE NEGATIVE Final   Staphylococcus aureus NEGATIVE NEGATIVE Final    Comment: (NOTE) The Xpert SA Assay (FDA approved for  NASAL specimens in patients 1 years of age and older), is one component of a comprehensive surveillance program. It is not intended to diagnose infection nor to guide or monitor treatment. Performed at [facility], [address], [place], [place] [address]      Scheduled Meds:  atorvastatin  20 mg Oral Daily   insulin aspart  0-15 Units Subcutaneous Q4H   Continuous Infusions:  lactated ringers 100 mL/hr at 04/23/23 6045   linezolid (ZYVOX) IV Stopped (04/23/23 0300)   magnesium sulfate bolus IVPB      Procedures/Studies: CT PELVIS WO CONTRAST Result Date: 04/23/2023 CLINICAL DATA:  evaluate for soft tissue infection right upper medial thigh/pubic EXAM: CT PELVIS WITHOUT CONTRAST TECHNIQUE: Multidetector CT imaging of the pelvis was performed following the standard protocol without intravenous contrast. RADIATION DOSE REDUCTION: This exam was performed according to the departmental dose-optimization program which includes automated exposure control, adjustment of the mA and/or kV according to patient size and/or use of iterative reconstruction technique. COMPARISON:  CT abdomen pelvis 07/28/2021 FINDINGS: Urinary Tract:  No abnormality visualized. Bowel: Colonic diverticulosis. Other unremarkable visualized pelvic bowel loops. Vascular/Lymphatic: Atherosclerotic plaque. No pathologically enlarged lymph nodes. No significant vascular abnormality seen.  Reproductive:  No mass or other significant abnormality Other: No intraperitoneal free fluid. No intraperitoneal free gas. No organized fluid collection. Musculoskeletal: Subcutaneus soft tissue edema and emphysema of the right proximal medial thigh. Asymmetric enlargement of the right gracilis. Associated perimuscular fat stranding. No organized fluid collection. No suspicious lytic or blastic osseous lesions. No acute displaced fracture. Severe degenerative changes of bilateral hips. IMPRESSION: 1. Subcutaneus soft tissue edema and emphysema of  the right proximal medial thigh. Asymmetric enlargement of the right gracilis-finding could represent underlying hematoma or myositis. Correlate with clinical findings for necrotizing fasciitis. 2. Severe degenerative changes of bilateral hips. Electronically Signed   By: Tish Frederickson M.D.   On: 04/23/2023 01:07    Catarina Hartshorn, DO  [facility]  If 7PM-7AM, please contact night-coverage [email or web address] Password [email or web address] 04/23/2023, 7:41 AM   LOS: 0 days

## 2023-04-23 NOTE — Plan of Care (Signed)

## 2023-04-23 NOTE — Plan of Care (Signed)

## 2023-04-23 NOTE — Progress Notes (Signed)
 Patient brought up to the floor via wheelchair. Patient is a one person assist with ambulation from wheelchair to bed. Patient is alert and oriented. Patient assisted out of her clothes and placed in a hospital gown. To right inner thigh/groin area, there is an area noted with a large induration. It is tender to touch, red, swollen, and warm to touch. Room orientation given. Call bell within reach. Fall precautions initiated.

## 2023-04-23 NOTE — Transfer of Care (Signed)
 Immediate Anesthesia Transfer of Care Note  Patient: Megan Mullins  Procedure(s) Performed: IRRIGATION, DRAINAGE AND DEBRIDEMENT OF NECROTIZING SOFT TISSUE INFECTION RIGHT GROIN DOWN TO MUSCLE, 11CM X 12CM X 5CM (Right: Groin)  Patient Location: PACU  Anesthesia Type:General  Level of Consciousness: awake  Airway & Oxygen Therapy: Patient Spontanous Breathing and Patient connected to face mask oxygen  Post-op Assessment: Report given to RN and Post -op Vital signs reviewed and stable  Post vital signs: Reviewed and stable  Last Vitals:  Vitals Value Taken Time  BP 147/99 04/23/23 1008  Temp 98.4   Pulse 118 04/23/23 1008  Resp 29 04/23/23 1008  SpO2 100 % 04/23/23 1008  Vitals shown include unfiled device data.  Last Pain:  Vitals:   04/23/23 0802  TempSrc:   PainSc: 0-No pain         Complications: No notable events documented.

## 2023-04-23 NOTE — Consult Note (Signed)
 [facility] Consult  Reason for Consult: Necrotizing fasciitis Referring Physician: Dr. Clayborne Dana  Chief Complaint   Abscess     HPI: MIKIA DELALUZ is a 53 y.o. female who presents with a 3 to 4-day history of a right groin infection.  She first noticed an area that she thought was a boil on Thursday/Friday of last week.  She went to the pharmacy, and picked up some antibiotic ointment which she has been applying to the area.  Over the last 24 hours, it got significantly worse with increasing pain, erythema, and an increase in size.  This prompted her to present to the emergency department.  She does have a history of boils in the past, though denies any boils in her groins.  She has had to have incision and drainage procedures for boils in the past.  Her past medical history significant for diabetes, hypertension, and hyperlipidemia.  Her surgical history significant for tubal ligation.  In the ED, she was noted to be tachycardic in the 110s.  Otherwise she was hemodynamically stable.  She had a mild leukocytosis of 12.8.  She was also noted to be significantly hyperglycemic with sugars at 524.  Her initial lactic acid was 2.1.    She underwent a CT of the pelvis which demonstrated subcutaneous soft tissue edema and emphysema of the right proximal medial thigh with asymmetric enlargement of the right gracilis, findings could represent underlying hematoma or myositis, correlate clinical findings for necrotizing fasciitis.  Upon evaluation of the patient this morning, she still has pain associated with her right groin but otherwise feels okay.  She denies fevers and chills.  She has had mild improvement in her heart rate in the low 100s.  Her leukocytosis increased to 16.8.  Past Medical History:  Diagnosis Date   Diabetes mellitus without complication (HCC)     Past Surgical History:  Procedure Laterality Date   TUBAL LIGATION      Family History  Problem Relation Age of  Onset   Heart disease Mother    Diabetes Mother     Social History   Tobacco Use   Smoking status: Former    Current packs/day: 0.00    Types: Cigarettes    Quit date: 01/16/2022    Years since quitting: 1.2   Smokeless tobacco: Never  Vaping Use   Vaping status: Never Used  Substance Use Topics   Alcohol use: Not Currently    Comment: haven't drank in 2xmos   Drug use: Not Currently    Types: Cocaine    Comment: last used today    Medications: I have reviewed the patient's current medications.  No Known Allergies   ROS:  Pertinent items are noted in HPI.  Blood pressure 127/86, pulse (!) 103, temperature 99.7 F (37.6 C), resp. rate 20, height 5' (1.524 m), weight 77.5 kg, last menstrual period 07/18/2021, SpO2 100%. Physical Exam Vitals reviewed.  Constitutional:      Appearance: Normal appearance.  HENT:     Head: Normocephalic and atraumatic.  Eyes:     Extraocular Movements: Extraocular movements intact.     Pupils: Pupils are equal, round, and reactive to light.  Cardiovascular:     Rate and Rhythm: Tachycardia present.  Pulmonary:     Effort: Pulmonary effort is normal.  Abdominal:     General: There is no distension.     Palpations: Abdomen is soft.     Tenderness: There is no abdominal tenderness.  Musculoskeletal:  General: Normal range of motion.     Cervical back: Normal range of motion.  Skin:    Comments: Right groin with an edematous and erythematous area, which is swollen and tender to palpation, possible punctate opening  Neurological:     General: No focal deficit present.     Mental Status: She is alert and oriented to person, place, and time.  Psychiatric:        Mood and Affect: Mood normal.        Behavior: Behavior normal.     Results: Results for orders placed or performed during the hospital encounter of 04/22/23 (from the past 48 hours)  CBG monitoring, ED     Status: Abnormal   Collection Time: 04/22/23  9:39 PM   Result Value Ref Range   Glucose-Capillary 524 (HH) 70 - 99 mg/dL    Comment: Glucose reference range applies only to samples taken after fasting for at least 8 hours.   Comment 1 Document in Chart   CBC     Status: Abnormal   Collection Time: 04/22/23 10:05 PM  Result Value Ref Range   WBC 12.8 (H) 4.0 - 10.5 K/uL   RBC 4.35 3.87 - 5.11 MIL/uL   Hemoglobin 13.5 12.0 - 15.0 g/dL   HCT 40.9 81.1 - 91.4 %   MCV 92.6 80.0 - 100.0 fL   MCH 31.0 26.0 - 34.0 pg   MCHC 33.5 30.0 - 36.0 g/dL   RDW 78.2 95.6 - 21.3 %   Platelets 323 150 - 400 K/uL   nRBC 0.0 0.0 - 0.2 %    Comment: Performed at [facility], [address], [place], [place] [address]  Comprehensive metabolic panel     Status: Abnormal   Collection Time: 04/22/23 10:05 PM  Result Value Ref Range   Sodium 126 (L) 135 - 145 mmol/L   Potassium 3.7 3.5 - 5.1 mmol/L   Chloride 90 (L) 98 - 111 mmol/L   CO2 21 (L) 22 - 32 mmol/L   Glucose, Bld 537 (HH) 70 - 99 mg/dL    Comment: CRITICAL RESULT CALLED TO, READ BACK BY AND VERIFIED WITH BELTON,K ON 04/22/23 AT 2250 BY PURDIE,J Glucose reference range applies only to samples taken after fasting for at least 8 hours.    BUN 26 (H) 6 - 20 mg/dL   Creatinine, Ser 8.46 (H) 0.44 - 1.00 mg/dL   Calcium 9.0 8.9 - 96.2 mg/dL   Total Protein 7.5 6.5 - 8.1 g/dL   Albumin 2.8 (L) 3.5 - 5.0 g/dL   AST 12 (L) 15 - 41 U/L   ALT 10 0 - 44 U/L   Alkaline Phosphatase 107 38 - 126 U/L   Total Bilirubin 0.5 0.0 - 1.2 mg/dL   GFR, Estimated 29 (L) >60 mL/min    Comment: (NOTE) Calculated using the CKD-EPI Creatinine Equation (2021)    Anion gap 15 5 - 15    Comment: Performed at [facility], [address] [place], [place] [address]  Lactic acid, plasma     Status: None   Collection Time: 04/22/23 10:05 PM  Result Value Ref Range   Lactic Acid, Venous 1.2 0.5 - 1.9 mmol/L    Comment: Performed at [facility], [address] [place], [place] [address]  Lactic acid, plasma     Status:  Abnormal   Collection Time: 04/23/23  1:21 AM  Result Value Ref Range   Lactic Acid, Venous 2.1 (HH) 0.5 - 1.9 mmol/L  Comment: CRITICAL RESULT CALLED TO, READ BACK BY AND VERIFIED WITH TURNER,C ON 04/23/23 AT 1610 BY PURDIE,J Performed at [facility], [address], [place], [place] [address]   Blood culture (routine x 2)     Status: None (Preliminary result)   Collection Time: 04/23/23  1:23 AM   Specimen: BLOOD LEFT FOREARM  Result Value Ref Range   Specimen Description BLOOD LEFT FOREARM    Special Requests BOTTLES DRAWN AEROBIC AND ANAEROBIC    Culture      NO GROWTH < 12 HOURS Performed at [facility], [address], [place], [place] [address]    Report Status PENDING   CBG monitoring, ED     Status: Abnormal   Collection Time: 04/23/23  1:29 AM  Result Value Ref Range   Glucose-Capillary 372 (H) 70 - 99 mg/dL    Comment: Glucose reference range applies only to samples taken after fasting for at least 8 hours.  Blood culture (routine x 2)     Status: None (Preliminary result)   Collection Time: 04/23/23  1:33 AM   Specimen: BLOOD LEFT FOREARM  Result Value Ref Range   Specimen Description BLOOD LEFT FOREARM    Special Requests BOTTLES DRAWN AEROBIC AND ANAEROBIC    Culture      NO GROWTH < 12 HOURS Performed at [facility], [address], [place], [place] [address]    Report Status PENDING   Surgical PCR screen     Status: None   Collection Time: 04/23/23  3:52 AM   Specimen: Nasal Mucosa; Nasal Swab  Result Value Ref Range   MRSA, PCR NEGATIVE NEGATIVE   Staphylococcus aureus NEGATIVE NEGATIVE    Comment: (NOTE) The Xpert SA Assay (FDA approved for NASAL specimens in patients 1 years of age and older), is one component of a comprehensive surveillance program. It is not intended to diagnose infection nor to guide or monitor treatment. Performed at [facility], [address], [place], [place] [address]   Comprehensive metabolic panel     Status: Abnormal    Collection Time: 04/23/23  6:03 AM  Result Value Ref Range   Sodium 129 (L) 135 - 145 mmol/L   Potassium 4.0 3.5 - 5.1 mmol/L   Chloride 93 (L) 98 - 111 mmol/L   CO2 23 22 - 32 mmol/L   Glucose, Bld 372 (H) 70 - 99 mg/dL    Comment: Glucose reference range applies only to samples taken after fasting for at least 8 hours.   BUN 25 (H) 6 - 20 mg/dL   Creatinine, Ser 2.13 (H) 0.44 - 1.00 mg/dL   Calcium 8.6 (L) 8.9 - 10.3 mg/dL   Total Protein 6.5 6.5 - 8.1 g/dL   Albumin 2.3 (L) 3.5 - 5.0 g/dL   AST 12 (L) 15 - 41 U/L   ALT 9 0 - 44 U/L   Alkaline Phosphatase 78 38 - 126 U/L   Total Bilirubin 0.5 0.0 - 1.2 mg/dL   GFR, Estimated 25 (L) >60 mL/min    Comment: (NOTE) Calculated using the CKD-EPI Creatinine Equation (2021)    Anion gap 13 5 - 15    Comment: Performed at [facility], [address] [place], [place] [address]  CBC     Status: Abnormal   Collection Time: 04/23/23  6:03 AM  Result Value Ref Range   WBC 16.5 (H) 4.0 - 10.5 K/uL   RBC 3.88 3.87 - 5.11 MIL/uL   Hemoglobin 12.2 12.0 - 15.0 g/dL  HCT 36.2 36.0 - 46.0 %   MCV 93.3 80.0 - 100.0 fL   MCH 31.4 26.0 - 34.0 pg   MCHC 33.7 30.0 - 36.0 g/dL   RDW 82.9 56.2 - 13.0 %   Platelets 319 150 - 400 K/uL   nRBC 0.0 0.0 - 0.2 %    Comment: Performed at [facility], [address], [place], [place] [address]  Lactic acid, plasma     Status: None   Collection Time: 04/23/23  6:03 AM  Result Value Ref Range   Lactic Acid, Venous 1.4 0.5 - 1.9 mmol/L    Comment: Performed at [facility], [address], [place], [place] [address]  Magnesium     Status: Abnormal   Collection Time: 04/23/23  6:03 AM  Result Value Ref Range   Magnesium 1.4 (L) 1.7 - 2.4 mg/dL    Comment: Performed at [facility], [address] [place], [place] [address]  Phosphorus     Status: None   Collection Time: 04/23/23  6:03 AM  Result Value Ref Range   Phosphorus 3.5 2.5 - 4.6 mg/dL    Comment: Performed at [facility], [address]  [place], [place] [address]  Glucose, capillary     Status: Abnormal   Collection Time: 04/23/23  7:55 AM  Result Value Ref Range   Glucose-Capillary 303 (H) 70 - 99 mg/dL    Comment: Glucose reference range applies only to samples taken after fasting for at least 8 hours.    CT PELVIS WO CONTRAST Result Date: 04/23/2023 CLINICAL DATA:  evaluate for soft tissue infection right upper medial thigh/pubic EXAM: CT PELVIS WITHOUT CONTRAST TECHNIQUE: Multidetector CT imaging of the pelvis was performed following the standard protocol without intravenous contrast. RADIATION DOSE REDUCTION: This exam was performed according to the departmental dose-optimization program which includes automated exposure control, adjustment of the mA and/or kV according to patient size and/or use of iterative reconstruction technique. COMPARISON:  CT abdomen pelvis 07/28/2021 FINDINGS: Urinary Tract:  No abnormality visualized. Bowel: Colonic diverticulosis. Other unremarkable visualized pelvic bowel loops. Vascular/Lymphatic: Atherosclerotic plaque. No pathologically enlarged lymph nodes. No significant vascular abnormality seen. Reproductive:  No mass or other significant abnormality Other: No intraperitoneal free fluid. No intraperitoneal free gas. No organized fluid collection. Musculoskeletal: Subcutaneus soft tissue edema and emphysema of the right proximal medial thigh. Asymmetric enlargement of the right gracilis. Associated perimuscular fat stranding. No organized fluid collection. No suspicious lytic or blastic osseous lesions. No acute displaced fracture. Severe degenerative changes of bilateral hips. IMPRESSION: 1. Subcutaneus soft tissue edema and emphysema of the right proximal medial thigh. Asymmetric enlargement of the right gracilis-finding could represent underlying hematoma or myositis. Correlate with clinical findings for necrotizing fasciitis. 2. Severe degenerative changes of bilateral hips. Electronically Signed   By:  Tish Frederickson M.D.   On: 04/23/2023 01:07     Assessment & Plan:  MARYLEN ZUK is a 53 y.o. female who was admitted with concern for necrotizing soft tissue infection of the right groin.  Imaging and blood work evaluated by myself.  - Discussed the pathophysiology of necrotizing soft tissue infections with the patient.  We discussed the need for operative debridement of the area.  I also discussed that she will have a wound in the area which could have difficulty healing with her poorly controlled blood sugars.  We also discussed the potential need for further debridements in the operating room. -The risk and benefits of incision, drainage, and debridement of right  groin necrotizing soft tissue infection were discussed including but not limited to bleeding, infection, injury to surrounding structures, need for additional procedures, poor wound healing, potential need for prolonged intubation, cardiac complication, or pulmonary complication.  After careful consideration, SOPHONIE GOFORTH has decided to proceed with surgery. -Plan for surgery this morning -NPO -IV fluids -Patient currently on linezolid, Zosyn, and clindamycin -Will obtain culture at the time of surgery -Glycemic control per hospitalist -Further recommendations regarding the disposition to follow surgery  All questions were answered to the satisfaction of the patient.   Note: Portions of this report may have been transcribed using voice recognition software. Every effort has been made to ensure accuracy; however, inadvertent computerized transcription errors may still be present.   -- Theophilus Kinds, DO [facility] [address] [place], [place] [address] [phone] (office)

## 2023-04-23 NOTE — Op Note (Signed)
 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   Procedure(s) Performed: Incision, drainage, and debridement of right groin necrotizing fasciitis, wound measuring 11 x 12 x 5 cm   Surgeon: Theophilus Kinds, DO    Assistants: No qualified resident was available    Anesthesia: General endotracheal   Anesthesiologist: Dr.Ewell   Specimens: Culture of right groin necrotizing soft tissue infection, tissue from necrotizing soft tissue infection   Estimated Blood Loss: 30 cc   Blood Replacement: None    Complications: None   Wound Class: Dirty/infected   Operative Indications: Patient is a 53 year old female who presented to the hospital with a 3 to 4-day history of worsening right groin infection.  She first noticed a cyst in her right groin on Thursday/Friday of last week, and it has progressively worsened and increased in size.  In the ED, she was noted have a leukocytosis of 12.8, and underwent a CT of the pelvis which demonstrated concern for necrotizing fasciitis of the right groin with possible involvement of the right gracilis muscle.  Discussed need for operative debridement with the patient, and she is agreeable to surgery.  All risks and benefits of performing this procedure were discussed with the patient including pain, infection, bleeding, damage to the surrounding structures, need for more procedures or surgery, poor wound healing, need for prolonged intubation, cardiac complication, and pulmonary complication. The patient voiced understanding of the procedure, all questions were sought and answered, and consent was obtained.  Findings: - Necrotizing fasciitis/myositis of the right groin with involvement of the right gracilis muscle   Procedure: The patient was  taken to the operating room and placed supine. General endotracheal anesthesia was induced.  Patient has been receiving linezolid, Zosyn, and clindamycin.  The patient was then placed into a lithotomy position.  The bilateral groins, and right thigh were prepared and draped in the usual sterile fashion.   An incision was made over an area of fluctuance and erythema in the right groin.  This was extended down until an abscess cavity with purulent drainage was noted in addition to an necrotizing soft tissue infection.  Upon entering the subcutaneous tissues, an abscess with necrotizing soft tissue infection was noted.  Wound culture was obtained of the necrotizing soft tissue infection.  All grossly infected tissue including the skin, subcutaneous tissue, fascia, and right gracilis muscle were sharply excised.  Hemostasis was achieved with both suture ligation of bleeding vessels and judicious use of electrocautery.  The necrotic tissue was removed down to healthy appearing fat.  The patient did have some necrotic right gracilis muscle which was debrided.  The infection was noted to slide along the gracilis muscle fascia, and the overlying subcutaneous tissues were opened until a hard endpoint was reached.  The cavity was then irrigated with a Pulsavac.  Hemostasis was again achieved.  The debridement area measured 11 x 12 x 5 cm.  The cavity was packed with 1 roll of Dakin's soaked Kerlix.  The wound was dressed with an ABD pad and Medipore tape.  Final inspection revealed acceptable hemostasis. All counts were correct at the end of the case. The patient was awakened from anesthesia and extubated without complication.  The patient went to the PACU in stable condition.   Theophilus Kinds, DO  [facility] [address] [place], [place]  [address] [phone] (office)

## 2023-04-23 NOTE — ED Notes (Signed)
 RN spoke with Dr Clayborne Dana to verify that pt IS Supposed to get all FOUR anbx ordered including rocephin, zosyn, Zyvox, and Cleocin. Blood cultures are being drawn now by lab

## 2023-04-23 NOTE — H&P (Signed)
 History and Physical    Patient: Megan Mullins ZOX:[id number] DOB: 12/23/1970 DOA: 04/22/2023 DOS: the patient was seen and examined on 04/23/2023 PCP: Benetta Spar, MD  Patient coming from: Home  Chief Complaint:  Chief Complaint  Patient presents with   Abscess   HPI: Megan Mullins is a 53 y.o. female with medical history significant of hypertension, hyperlipidemia, T2DM, tobacco abuse, CKD 3B who presents to the emergency department due to a small bump on the right upper medial thigh that showed up a few days ago but which rapidly worsened within the last 24-hours.  It has since increased in size and was painful, so she went to the ED for further evaluation and management.  ED Course:  In the emergency department, she was tachycardic, but other vital signs were within normal range.  Workup in the ED showed normal CBC except for WBC of 12.8.  BMP showed sodium 126, potassium 3.7, chloride 90, bicarb 21, blood glucose 437, BUN/creatinine 26/2.05 (baseline creatinine at 1.6).  Albumin 2.8.  Lactic acid 1.2 > 2.1. CT pelvis without contrast shows subcutaneous soft tissue edema and emphysema of the right proximal medial thigh. Asymmetric enlargement of the right gracilis-finding could represent underlying hematoma or myositis with concern for necrotizing fasciitis. General surgery (Dr Robyne Peers) was consulted and recommended IV fluids, antibiotics with plan for operative debridement in the morning. IV ceftriaxone, clindamycin, Zyvox and Zosyn were given.  Hospitalist was asked to admit patient for further evaluation and management.   Review of Systems: Review of systems as noted in the HPI. All other systems reviewed and are negative.   Past Medical History:  Diagnosis Date   Diabetes mellitus without complication (HCC)    Past Surgical History:  Procedure Laterality Date   TUBAL LIGATION      Social History:  reports that she quit smoking about 15 months ago. Her smoking  use included cigarettes. She has never used smokeless tobacco. She reports that she does not currently use alcohol. She reports that she does not currently use drugs after having used the following drugs: Cocaine.   No Known Allergies  Family History  Problem Relation Age of Onset   Heart disease Mother    Diabetes Mother      Prior to Admission medications   Medication Sig Start Date End Date Taking? Authorizing Provider  amLODipine (NORVASC) 5 MG tablet Take 1 tablet (5 mg total) by mouth daily. 11/21/22   Catarina Hartshorn, MD  atorvastatin (LIPITOR) 20 MG tablet Take 20 mg by mouth daily. 07/19/22   [provider]  HYDROcodone-acetaminophen (NORCO/VICODIN) 5-325 MG tablet Take 1 tablet by mouth every 6 (six) hours as needed for moderate pain (pain score 4-6). 11/21/22   Catarina Hartshorn, MD  insulin aspart (NOVOLOG) 100 UNIT/ML injection Inject 0-15 Units into the skin 3 (three) times daily with meals. CBG 70 - 120: 0 units CBG 121 - 150: 0 units CBG 151 - 200: 0 units CBG 201 - 250: 2 units CBG 251 - 300: 3 units CBG 301 - 350: 4 units CBG 351 - 400: 5 units 02/03/22   Pahwani, Daleen Bo, MD  insulin glargine (LANTUS SOLOSTAR) 100 UNIT/ML Solostar Pen Inject 25 Units into the skin 2 (two) times daily. Patient taking differently: Inject 25 Units into the skin at bedtime. 02/03/22   Hughie Closs, MD  metFORMIN (GLUCOPHAGE) 1000 MG tablet Take 1 tablet (1,000 mg total) by mouth 2 (two) times daily with a meal. 02/03/22 11/20/22  Pahwani,  Daleen Bo, MD    Physical Exam: BP 105/75 (BP Location: Left Arm)   Pulse (!) 103   Temp 98.5 F (36.9 C) (Oral)   Resp 19   Ht 5' (1.524 m)   Wt 77.5 kg   SpO2 93%   BMI 33.37 kg/m   General: 53 y.o. year-old female well developed well nourished in no acute distress.  Alert and oriented x3. HEENT: NCAT, EOMI, dry mucous membrane Neck: Supple, trachea medial Cardiovascular: Regular rate and rhythm with no rubs or gallops.  No thyromegaly or JVD noted.  No lower  extremity edema. 2/4 pulses in all 4 extremities. Respiratory: Clear to auscultation with no wheezes or rales. Good inspiratory effort. Abdomen: Soft, nontender nondistended with normal bowel sounds x4 quadrants. Muskuloskeletal: No cyanosis, clubbing or edema noted bilaterally Neuro: CN II-XII intact, strength 5/5 x 4, sensation, reflexes intact Skin: RN chaperoned exam.  Large induration noted in upper right medial thigh, this was tender to palpation, warm to touch with slight erythema.  No crepitus.   Psychiatry: Judgement and insight appear normal. Mood is appropriate for condition and setting          Labs on Admission:  Basic Metabolic Panel: Recent Labs  Lab 04/22/23 2205  NA 126*  K 3.7  CL 90*  CO2 21*  GLUCOSE 537*  BUN 26*  CREATININE 2.05*  CALCIUM 9.0   Liver Function Tests: Recent Labs  Lab 04/22/23 2205  AST 12*  ALT 10  ALKPHOS 107  BILITOT 0.5  PROT 7.5  ALBUMIN 2.8*   No results for input(s): "LIPASE", "AMYLASE" in the last 168 hours. No results for input(s): "AMMONIA" in the last 168 hours. CBC: Recent Labs  Lab 04/22/23 2205  WBC 12.8*  HGB 13.5  HCT 40.3  MCV 92.6  PLT 323   Cardiac Enzymes: No results for input(s): "CKTOTAL", "CKMB", "CKMBINDEX", "TROPONINI" in the last 168 hours.  BNP (last 3 results) No results for input(s): "BNP" in the last 8760 hours.  ProBNP (last 3 results) No results for input(s): "PROBNP" in the last 8760 hours.  CBG: Recent Labs  Lab 04/22/23 2139 04/23/23 0129  GLUCAP 524* 372*    Radiological Exams on Admission: CT PELVIS WO CONTRAST Result Date: 04/23/2023 CLINICAL DATA:  evaluate for soft tissue infection right upper medial thigh/pubic EXAM: CT PELVIS WITHOUT CONTRAST TECHNIQUE: Multidetector CT imaging of the pelvis was performed following the standard protocol without intravenous contrast. RADIATION DOSE REDUCTION: This exam was performed according to the departmental dose-optimization program  which includes automated exposure control, adjustment of the mA and/or kV according to patient size and/or use of iterative reconstruction technique. COMPARISON:  CT abdomen pelvis 07/28/2021 FINDINGS: Urinary Tract:  No abnormality visualized. Bowel: Colonic diverticulosis. Other unremarkable visualized pelvic bowel loops. Vascular/Lymphatic: Atherosclerotic plaque. No pathologically enlarged lymph nodes. No significant vascular abnormality seen. Reproductive:  No mass or other significant abnormality Other: No intraperitoneal free fluid. No intraperitoneal free gas. No organized fluid collection. Musculoskeletal: Subcutaneus soft tissue edema and emphysema of the right proximal medial thigh. Asymmetric enlargement of the right gracilis. Associated perimuscular fat stranding. No organized fluid collection. No suspicious lytic or blastic osseous lesions. No acute displaced fracture. Severe degenerative changes of bilateral hips. IMPRESSION: 1. Subcutaneus soft tissue edema and emphysema of the right proximal medial thigh. Asymmetric enlargement of the right gracilis-finding could represent underlying hematoma or myositis. Correlate with clinical findings for necrotizing fasciitis. 2. Severe degenerative changes of bilateral hips. Electronically Signed   By: Blanchie Serve  Tessie Fass M.D.   On: 04/23/2023 01:07    EKG: I independently viewed the EKG done and my findings are as followed: EKG was not done in the ED  Assessment/Plan Present on Admission:  Cellulitis of right thigh  Type 2 diabetes mellitus with hyperglycemia (HCC)  Essential hypertension  Obesity (BMI 30-39.9)  Principal Problem:   Cellulitis of right thigh Active Problems:   Essential hypertension   Obesity (BMI 30-39.9)   Type 2 diabetes mellitus with hyperglycemia (HCC)   Lactic acidosis   Mixed hyperlipidemia  Cellulitis of right thigh Patient was treated with IV ceftriaxone, clindamycin, Zyvox, Zosyn We shall continue with IV  Zyvox  Acute kidney injury on CKD 4 Dehydration  BUN/creatinine 26/2.05 (baseline creatinine at 1.6) Continue gentle hydration Renally adjust medications, avoid nephrotoxic agents/dehydration/hypotension  Type 2 diabetes mellitus with hyperglycemia Hemoglobin A1c on 11/21/2022 was > 15.5 Continue Semglee 10 units and adjust dose accordingly Continue ISS and hypoglycemia protocol  Lactic acidosis Lactic acid 1.2 > 2.1, continue IV hydration Continue to trend lactic acid  Essential hypertension Continue amlodipine  Mixed hyperlipidemia Continue Lipitor  Obesity class I (BMI 33.37) Diet and lifestyle modification  DVT prophylaxis: SCDs  Code Status: Full code  Family Communication: None at bedside  Consults: General Surgery  Severity of Illness: The appropriate patient status for this patient is INPATIENT. Inpatient status is judged to be reasonable and necessary in order to provide the required intensity of service to ensure the patient's safety. The patient's presenting symptoms, physical exam findings, and initial radiographic and laboratory data in the context of their chronic comorbidities is felt to place them at high risk for further clinical deterioration. Furthermore, it is not anticipated that the patient will be medically stable for discharge from the hospital within 2 midnights of admission.   * I certify that at the point of admission it is my clinical judgment that the patient will require inpatient hospital care spanning beyond 2 midnights from the point of admission due to high intensity of service, high risk for further deterioration and high frequency of surveillance required.*  Author: Frankey Shown, DO 04/23/2023 5:50 AM  For on call review [email or web address].

## 2023-04-23 NOTE — Anesthesia Preprocedure Evaluation (Addendum)
 Anesthesia Evaluation  Patient identified by MRN, date of birth, ID band Patient awake    Reviewed: Allergy & Precautions, H&P , NPO status , Patient's Chart, lab work & pertinent test results, reviewed documented beta blocker date and time   Airway Mallampati: II  TM Distance: >3 FB Neck ROM: full    Dental  (+) Edentulous Upper, Missing, Loose, Poor Dentition She says all her lower teeth are loose:   Pulmonary Patient abstained from smoking., former smoker   Pulmonary exam normal breath sounds clear to auscultation       Cardiovascular Exercise Tolerance: Good hypertension, Normal cardiovascular exam Rhythm:regular Rate:Normal     Neuro/Psych negative neurological ROS  negative psych ROS   GI/Hepatic negative GI ROS, Neg liver ROS,,,  Endo/Other  diabetes    Renal/GU CRFRenal disease  negative genitourinary   Musculoskeletal   Abdominal   Peds  Hematology negative hematology ROS (+)   Anesthesia Other Findings   Reproductive/Obstetrics negative OB ROS                             Anesthesia Physical Anesthesia Plan  ASA: 4 and emergent  Anesthesia Plan: General   Post-op Pain Management: Dilaudid IV   Induction:   PONV Risk Score and Plan: Ondansetron, Dexamethasone and Midazolam  Airway Management Planned: Oral ETT  Additional Equipment: None  Intra-op Plan:   Post-operative Plan: Extubation in OR  Informed Consent: I have reviewed the patients History and Physical, chart, labs and discussed the procedure including the risks, benefits and alternatives for the proposed anesthesia with the patient or authorized representative who has indicated his/her understanding and acceptance.     Dental Advisory Given  Plan Discussed with: CRNA  Anesthesia Plan Comments:         Anesthesia Quick Evaluation

## 2023-04-23 NOTE — Progress Notes (Signed)
 Patient arrived to her room. She is very sleepy. VSS. Call bell within reach.

## 2023-04-23 NOTE — Progress Notes (Signed)
 [facility]  Spoke with the patient's mother and son on the phone.  I explained that she tolerated the procedure without difficulty.  She has an extensive wound to her right groin, which will require takeback to the operating room for reevaluation.  There is a chance that she will require more tissue and muscle debridement at the time of takeback surgery.  She will also likely need skin graft or other assistance with wound management.  For this reason, the patient will be transferred down to [facility] for multidisciplinary care of her wound.  All questions were answered to their expressed satisfaction.  Plan: -Patient to return to bed on the floor -Okay for regular diet -Continue IV antibiotics with Zyvox, Zosyn, and clindamycin -Follow-up wound cultures -PRN pain control and antiemetics -IV fluids -Discussed case with Dr. Arbutus Leas and [facility] surgery for possible need for multidisciplinary care and management of the wound in the future.  Plan for transfer down to [facility], DO [facility] [address] [place], [place] [address] [phone] (office)

## 2023-04-23 NOTE — Brief Op Note (Signed)
 04/23/2023  10:41 AM  PATIENT:  Megan Mullins  53 y.o. 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  PROCEDURE:  Procedure(s): IRRIGATION, DRAINAGE AND DEBRIDEMENT OF NECROTIZING SOFT TISSUE INFECTION RIGHT GROIN DOWN TO MUSCLE, 11CM X 12CM X 5CM (Right)  SURGEON:  Surgeons and Role:    * Brynden Thune A, DO - Primary  PHYSICIAN ASSISTANT:   ASSISTANTS: none   ANESTHESIA:   general  EBL:  30 cc  BLOOD ADMINISTERED:none  DRAINS: none   LOCAL MEDICATIONS USED:  NONE    SPECIMEN:  Source of Specimen:  Culture of necrotizing soft tissue infection; tissue of necrotizing soft tissue infection  DISPOSITION OF SPECIMEN:  PATHOLOGY  COUNTS:  YES  DICTATION: .Note written in EPIC  PLAN OF CARE: Admit to inpatient   PATIENT DISPOSITION:  PACU - hemodynamically stable.   Delay start of Pharmacological VTE agent (>24hrs) due to surgical blood loss or risk of bleeding: no  Theophilus Kinds, DO [facility] [address] [place], [place] [address] [phone] (office)

## 2023-04-23 NOTE — Anesthesia Procedure Notes (Signed)
 Procedure Name: Intubation Date/Time: 04/23/2023 8:50 AM  Performed by: Julian Reil, CRNAPre-anesthesia Checklist: Patient identified, Emergency Drugs available, Suction available and Patient being monitored Patient Re-evaluated:Patient Re-evaluated prior to induction Oxygen Delivery Method: Circle system utilized Preoxygenation: Pre-oxygenation with 100% oxygen Induction Type: IV induction, Rapid sequence and Cricoid Pressure applied Laryngoscope Size: Glidescope and 3 Grade View: Grade I Tube type: Oral Tube size: 7.0 mm Number of attempts: 1 Airway Equipment and Method: Rigid stylet and Video-laryngoscopy Placement Confirmation: ETT inserted through vocal cords under direct vision, positive ETCO2 and breath sounds checked- equal and bilateral Secured at: 23 cm Tube secured with: Tape Dental Injury: Teeth and Oropharynx as per pre-operative assessment  Comments: Noted very poor dentition in Pre-op.  Large tongue. Glidescope electively used.

## 2023-04-23 NOTE — Inpatient Diabetes Management (Signed)
 Inpatient Diabetes Program Recommendations  AACE/ADA: New Consensus Statement on Inpatient Glycemic Control   Target Ranges:  Prepandial:   less than 140 mg/dL      Peak postprandial:   less than 180 mg/dL (1-2 hours)      Critically ill patients:  140 - 180 mg/dL    Latest Reference Range & Units 04/22/23 21:39 04/23/23 01:29  Glucose-Capillary 70 - 99 mg/dL 161 (HH) 096 (H)    Latest Reference Range & Units 04/22/23 22:05 04/23/23 06:03  Glucose 70 - 99 mg/dL 045 (HH) 409 (H)    Latest Reference Range & Units 11/21/22 01:13  Hemoglobin A1C 4.8 - 5.6 % >15.5 (H)   Review of Glycemic Control  Diabetes history: DM2 Outpatient Diabetes medications: Lantus 25 units at bedtime, Novolog 0-15 units TID with meals, Metformin 1000 mg BID Current orders for Inpatient glycemic control: Semglee 10 units daily, Novolog 0-15 units Q4H  Inpatient Diabetes Program Recommendations:    Insulin: Noted Semglee 10 units daily ordered this morning to start at 10:00 am.  May want to consider increasing Semglee to 15 units daily (based on 77.5 kg x 0.2 units).  Once diet resumed, please consider ordering Novolog 5 units TID with meals for meal coverage if patient eats at least 50% of meals.  HbgA1C: Please consider ordering an A1C to evaluate glycemic control over the past 2-3 months.  Thanks, Orlando Penner, RN, MSN, CDCES Diabetes Coordinator Inpatient Diabetes Program [phone] (Team Pager from 8am to 5pm)

## 2023-04-23 NOTE — Progress Notes (Signed)
 Report called to Carollee Herter, RN at [facility] ,5N .

## 2023-04-24 ENCOUNTER — Encounter (HOSPITAL_COMMUNITY): Payer: Self-pay | Admitting: Surgery

## 2023-04-24 DIAGNOSIS — L03115 Cellulitis of right lower limb: Secondary | ICD-10-CM | POA: Diagnosis not present

## 2023-04-24 LAB — BASIC METABOLIC PANEL WITH GFR
Anion gap: 13 (ref 5–15)
BUN: 28 mg/dL — ABNORMAL HIGH (ref 6–20)
CO2: 23 mmol/L (ref 22–32)
Calcium: 8.4 mg/dL — ABNORMAL LOW (ref 8.9–10.3)
Chloride: 94 mmol/L — ABNORMAL LOW (ref 98–111)
Creatinine, Ser: 3.37 mg/dL — ABNORMAL HIGH (ref 0.44–1.00)
GFR, Estimated: 16 mL/min — ABNORMAL LOW (ref >60)
Glucose, Bld: 308 mg/dL — ABNORMAL HIGH (ref 70–99)
Potassium: 4 mmol/L (ref 3.5–5.1)
Sodium: 130 mmol/L — ABNORMAL LOW (ref 135–145)

## 2023-04-24 LAB — CBC
HCT: 32.5 % — ABNORMAL LOW (ref 36.0–46.0)
Hemoglobin: 11 g/dL — ABNORMAL LOW (ref 12.0–15.0)
MCH: 31.3 pg (ref 26.0–34.0)
MCHC: 33.8 g/dL (ref 30.0–36.0)
MCV: 92.3 fL (ref 80.0–100.0)
Platelets: 302 10*3/uL (ref 150–400)
RBC: 3.52 MIL/uL — ABNORMAL LOW (ref 3.87–5.11)
RDW: 13.8 % (ref 11.5–15.5)
WBC: 16.3 10*3/uL — ABNORMAL HIGH (ref 4.0–10.5)
nRBC: 0 % (ref 0.0–0.2)

## 2023-04-24 LAB — GLUCOSE, CAPILLARY
Glucose-Capillary: 272 mg/dL — ABNORMAL HIGH (ref 70–99)
Glucose-Capillary: 446 mg/dL — ABNORMAL HIGH (ref 70–99)
Glucose-Capillary: 422 mg/dL — ABNORMAL HIGH (ref 70–99)
Glucose-Capillary: 197 mg/dL — ABNORMAL HIGH (ref 70–99)
Glucose-Capillary: 78 mg/dL (ref 70–99)
Glucose-Capillary: 113 mg/dL — ABNORMAL HIGH (ref 70–99)

## 2023-04-24 LAB — SURGICAL PATHOLOGY

## 2023-04-24 LAB — MAGNESIUM: Magnesium: 1.4 mg/dL — ABNORMAL LOW (ref 1.7–2.4)

## 2023-04-24 MED ORDER — MAGNESIUM SULFATE 2 GM/50ML IV SOLN
2.0000 g | Freq: Once | INTRAVENOUS | Status: AC
  Administered 2023-04-24: 2 g via INTRAVENOUS
  Filled 2023-04-24: qty 50

## 2023-04-24 MED ORDER — AMLODIPINE BESYLATE 5 MG PO TABS
5.0000 mg | ORAL_TABLET | Freq: Every day | ORAL | Status: DC
  Administered 2023-04-24 – 2023-04-27 (×4): 5 mg via ORAL
  Filled 2023-04-24 (×4): qty 1

## 2023-04-24 MED ORDER — INSULIN ASPART 100 UNIT/ML IJ SOLN
4.0000 [IU] | Freq: Three times a day (TID) | INTRAMUSCULAR | Status: DC
  Administered 2023-04-24 – 2023-04-27 (×9): 4 [IU] via SUBCUTANEOUS

## 2023-04-24 MED ORDER — PIPERACILLIN-TAZOBACTAM IN DEX 2-0.25 GM/50ML IV SOLN
2.2500 g | Freq: Three times a day (TID) | INTRAVENOUS | Status: DC
  Administered 2023-04-24 – 2023-04-26 (×6): 2.25 g via INTRAVENOUS
  Filled 2023-04-24 (×7): qty 50

## 2023-04-24 MED ORDER — LACTATED RINGERS IV SOLN
INTRAVENOUS | Status: DC
Start: 2023-04-24 — End: 2023-04-25

## 2023-04-24 MED ORDER — INSULIN GLARGINE-YFGN 100 UNIT/ML ~~LOC~~ SOLN
10.0000 [IU] | Freq: Once | SUBCUTANEOUS | Status: AC
  Administered 2023-04-24: 10 [IU] via SUBCUTANEOUS
  Filled 2023-04-24: qty 0.1

## 2023-04-24 MED ORDER — INSULIN ASPART 100 UNIT/ML IJ SOLN
20.0000 [IU] | Freq: Once | INTRAMUSCULAR | Status: AC
  Administered 2023-04-24: 20 [IU] via SUBCUTANEOUS

## 2023-04-24 MED ORDER — INSULIN GLARGINE-YFGN 100 UNIT/ML ~~LOC~~ SOLN
18.0000 [IU] | Freq: Every day | SUBCUTANEOUS | Status: DC
  Administered 2023-04-25 – 2023-04-27 (×3): 18 [IU] via SUBCUTANEOUS
  Filled 2023-04-24 (×3): qty 0.18

## 2023-04-24 MED ORDER — OXYCODONE HCL 5 MG PO TABS: 5.0000 mg | ORAL_TABLET | ORAL | Status: DC | PRN

## 2023-04-24 NOTE — Inpatient Diabetes Management (Addendum)
 Inpatient Diabetes Program Recommendations  AACE/ADA: New Consensus Statement on Inpatient Glycemic Control (2015)  Target Ranges:  Prepandial:   less than 140 mg/dL      Peak postprandial:   less than 180 mg/dL (1-2 hours)      Critically ill patients:  140 - 180 mg/dL   Lab Results  Component Value Date   GLUCAP 272 (H) 04/24/2023   HGBA1C >15.5 (H) 11/21/2022    Review of Glycemic Control  Latest Reference Range & Units 04/23/23 10:12 04/23/23 11:16 04/23/23 16:27 04/23/23 20:21 04/24/23 07:24  Glucose-Capillary 70 - 99 mg/dL 161 (H) 096 (H) 045 (H) 200 (H) 272 (H)  (H): Data is abnormally high Diabetes history: DM2 Outpatient Diabetes medications: Lantus 25 units at bedtime, Novolog 0-15 units TID with meals, Metformin 1000 mg BID Current orders for Inpatient glycemic control: Semglee 10 units daily, Novolog 0-15 units TID & HS   Inpatient Diabetes Program Recommendations:     Consider increasing Semglee to 18 units daily, adding Novolog 4 units TID with meals for meal coverage if patient eats at least 50% of meals and adding A1C.  Addendum: Spoke with patient regarding outpatient diabetes management. Patient stretches insulin due to cost from [facility]. Reports that she filed for disability and Medicaid and that she has been approved. However, does not know when it will "kick in" because she also had a [employer] plan that she did not realize she had to pay premiums for.  Reviewed patient's previous A1c of >15.5% with another A1C pending although anticipate close to same value. Explained what a A1c is and what it measures. Also reviewed goal A1c with patient, importance of good glucose control @ home, and blood sugar goals. Reviewed patho of DM, need for improvement, need to found affordable plan at discharge, free clinic in [facility], basic survival skills, interventions, risks for infection, vascular changes and commorbidities.  Patient has a meter. Reviewed  recommended frequency and when to follow up with PCP. Secure chat sent to [facility], Marylene Land to assist in coordinating follow up appointment and assistance with medications.  Admits to drinking sugary beverages. Reviewed alternatives and encouraged overall mindfulness.   Thanks, Lujean Rave, MSN, RNC-OB Diabetes Coordinator [phone] (8a-5p)      Thanks, Lujean Rave, MSN, RNC-OB Diabetes Coordinator [phone] (8a-5p)

## 2023-04-24 NOTE — Progress Notes (Signed)
 Progress Note   Patient: Megan Mullins UJW:[id number] DOB: Apr 13, 1970 DOA: 04/22/2023     1 DOS: the patient was seen and examined on 04/24/2023   Brief hospital course: 53 year old female with a history of diabetes mellitus type 2, hypertension, CKD stage III, tobacco abuse presenting with right medial thigh pain.  The patient states that she noticed what she felt to be a "hair bump" starting on 04/19/2023.  She stated that over the next few days, the area increased in size with some edema and pain.  She used some Neosporin without much improvement.  She denied any fevers, chills, chest pain, shortness breath, cough, hemoptysis.  She states that she has not completely compliant with her insulin.  She continues to smoke cigarettes. In the ED, the patient had low-grade temperature.  She was initially tachycardic in 110s.  She remained hemodynamically stable.  WBC 12.8, hemoglobin 13.5, platelets 319.  Sodium 126, potassium 3.7, bicarbonate 21, serum creatinine 2.05.  LFTs unremarkable.  CT of the pelvis showed soft tissue edema and emphysema in the right proximal medial thigh concerning for possible necrotizing infection.  General surgery was consulted assist with management.  The patient was started on Zosyn, linezolid, and clindamycin.  Assessment and Plan: Cellulitis/abscess of right thigh -General Surgery consulted and pt underwent debridement at [facility] with surgery recs to tx to [facility] given initial concerns of need further debridement and a multidisciplinary approach -Continued on linezolid and Zosyn -Pt seen by General Surgery at [facility] with no concern for ongoing NSTI -Gen Surg recs for BID wet to dry dressing as long as wound remains open and to f/u in 2-3 weeks for wound check -Judicious opioids -Follow cultures   Hyperosmolar nonketotic state -Patient presented with serum glucose 537 -Now on seglee 18 u with 4 u meal coverage per Diabetic Coordinator recs   Uncontrolled diabetes mellitus type 2 with  hyperglycemia -11/21/2022 hemoglobin A1c >15.5 -cont SSI as needed -Currently on semglee 18 u with 4 u meal coverage per Diabetic coordinator   Acute on chronic renal failure --CKD stage IIIb -Baseline creatinine 1.5-1.6 -Serum creatinine peaking 3.37 -cont IVF -will check renal US -recheck bmet in AM   Hypomagnesemia -Replete   Essential hypertension -continue home norvasc   Tobacco abuse -Tobacco cessation discussed   Hyponatremia -Secondary to volume depletion and hyperglycemia   Mixed hyperlipidemia -Continue statin   Obesity -BMI 33.37 -lifestyle modification      Subjective: Without complaints this AM  Physical Exam: Vitals:   04/23/23 2143 04/24/23 0516 04/24/23 0726 04/24/23 1451  BP: 119/83 132/88 (!) 134/108 (!) 139/104  Pulse: (!) 58 70 (!) 50   Resp: 14 14 17 18   Temp: 98.6 F (37 C) 97.7 F (36.5 C) 97.9 F (36.6 C) 97.7 F (36.5 C)  TempSrc: Oral Oral Oral Oral  SpO2: 100% 100% 100% 99%  Weight:      Height:       General exam: Awake, laying in bed, in nad Respiratory system: Normal respiratory effort, no wheezing Cardiovascular system: regular rate, s1, s2 Gastrointestinal system: Soft, nondistended, positive BS Central nervous system: CN2-12 grossly intact, strength intact Extremities: Perfused, no clubbing Skin: Normal skin turgor, no notable skin lesions seen Psychiatry: Mood normal // no visual hallucinations   Data Reviewed:  Labs reviewed: Na 130, K 4.0, Cr 3.37, WBC 16.3, hgb 11.0  Family Communication: Pt in room, family not at bedside  Disposition: Status is: Inpatient Remains inpatient appropriate because: severity of illness  Planned Discharge  Destination: Home    Author: Rickey Barbara, MD 04/24/2023 4:33 PM  For on call review [email or web address].

## 2023-04-24 NOTE — Progress Notes (Signed)
 PHARMACY NOTE:  ANTIMICROBIAL RENAL DOSAGE ADJUSTMENT  Current antimicrobial regimen includes a mismatch between antimicrobial dosage and estimated renal function.  As per policy approved by the Pharmacy & Therapeutics and Medical Executive Committees, the antimicrobial dosage will be adjusted accordingly.  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     Antimicrobial dosage has been changed to:  2.25g q8hr    Thank you for allowing pharmacy to be a part of this patient's care.  Alphia Moh, PharmD, BCPS, BCCP Clinical Pharmacist  Please check AMION for all [facility] Pharmacy phone numbers After 10:00 PM, call Main Pharmacy [phone]

## 2023-04-24 NOTE — Discharge Instructions (Signed)
WOUND CARE: - dressing to be changed twice daily - supplies: sterile saline, kerlix/guaze, scissors, ABD pads, tape  - remove dressing and all packing carefully, moistening with sterile saline as needed to avoid packing/internal dressing sticking to the wound. - clean edges of skin around the wound with water/gauze, making sure there is no tape debris or leakage left on skin that could cause skin irritation or breakdown. - dampen clean kerlix/gauze with sterile saline and pack wound from wound base to skin level, making sure to take note of any possible areas of wound tracking, tunneling and packing appropriately. Wound can be packed loosely. Trim kerlix/gauze to size if a whole roll/piece is not required. - cover wound with a dry ABD pad and secure with tape.  - write the date/time on the dry dressing/tape to better track when the last dressing change occurred. - apply any skin protectant/powder recommended by clinician to protect skin/skin folds. - change dressing as needed if leakage occurs, wound gets contaminated, or patient requests to shower. - patient may shower daily with wound open and following the shower the wound should be dried and a clean dressing placed.   

## 2023-04-24 NOTE — Consult Note (Signed)
 Consult Note  Megan Mullins 1970-03-31  [id number].    Requesting MD: Theophilus Kinds, DO Chief Complaint/Reason for Consult: NSTI HPI:  Patient is a 53 year old female who presented to the ED at [facility] with right groin swelling and pain that has worsened over the last 24 hrs but has been present several days. PMH significant for T2DM, HTN, HLD, CKD IIIb and tobacco abuse. Found to have concern for NSTI and general surgery consulted there. Patient underwent debridement of right groin and was sent to [facility] for multidisciplinary wound care. NKDA. This morning patient reports mild pain but overall doing well. Denies subjective fevers, nausea or vomiting.   ROS: Negative other than HPI  Family History  Problem Relation Age of Onset   Heart disease Mother    Diabetes Mother     Past Medical History:  Diagnosis Date   Diabetes mellitus without complication (HCC)     Past Surgical History:  Procedure Laterality Date   IRRIGATION AND DEBRIDEMENT OF NECROTIZING SOFT TISSUE INFECTION Right 04/23/2023   Procedure: IRRIGATION, DRAINAGE AND DEBRIDEMENT OF NECROTIZING SOFT TISSUE INFECTION RIGHT GROIN DOWN TO MUSCLE, 11CM X 12CM X 5CM;  Surgeon: Lewie Chamber, DO;  Location: [facility];  Service: General;  Laterality: Right;   TUBAL LIGATION      Social History:  reports that she quit smoking about 15 months ago. Her smoking use included cigarettes. She has never used smokeless tobacco. She reports that she does not currently use alcohol. She reports that she does not currently use drugs after having used the following drugs: Cocaine.  Allergies: No Known Allergies  Medications Prior to Admission  Medication Sig Dispense Refill   atorvastatin (LIPITOR) 20 MG tablet Take 20 mg by mouth daily.     insulin aspart (NOVOLOG) 100 UNIT/ML injection Inject 0-15 Units into the skin 3 (three) times daily with meals. CBG 70 - 120: 0 units CBG 121 - 150: 0 units CBG  151 - 200: 0 units CBG 201 - 250: 2 units CBG 251 - 300: 3 units CBG 301 - 350: 4 units CBG 351 - 400: 5 units 10 mL 11   insulin glargine (LANTUS SOLOSTAR) 100 UNIT/ML Solostar Pen Inject 25 Units into the skin 2 (two) times daily. (Patient taking differently: Inject 25 Units into the skin 2 (two) times daily. Per pt at bedtime) 15 mL 0   metFORMIN (GLUCOPHAGE) 850 MG tablet Take 850 mg by mouth 2 (two) times daily.     amLODipine (NORVASC) 5 MG tablet Take 1 tablet (5 mg total) by mouth daily. (Patient not taking: Reported on 04/23/2023) 30 tablet 1   LANTUS 100 UNIT/ML injection Inject 30 Units into the skin at bedtime. (Patient not taking: Reported on 04/23/2023)      Blood pressure (!) 134/108, pulse (!) 50, temperature 97.9 F (36.6 C), temperature source Oral, resp. rate 17, height 5' (1.524 m), weight 77.5 kg, last menstrual period 07/18/2021, SpO2 100%. Physical Exam:  General: pleasant, WD, obese female who is laying in bed in NAD HEENT: head is normocephalic, atraumatic.  Sclera are noninjected.  PERRL.  Ears and nose without any masses or lesions.  Mouth is pink and moist Heart: regular, rate, and rhythm.  Palpable radial and pedal pulses bilaterally Lungs: CTAB, no wheezes, rhonchi, or rales noted.  Respiratory effort nonlabored Abd: soft, NT, ND GU: right groin wound appears fairly clean with some fibrin in wound base, minimal drainage that  is not purulent, no signs of ongoing NSTI MS: all 4 extremities are symmetrical with no cyanosis, clubbing, or edema. Skin: warm and dry with no masses, lesions, or rashes Neuro: Cranial nerves 2-12 grossly intact, sensation is normal throughout Psych: A&Ox3 with an appropriate affect.   Results for orders placed or performed during the hospital encounter of 04/22/23 (from the past 48 hours)  CBG monitoring, ED     Status: Abnormal   Collection Time: 04/22/23  9:39 PM  Result Value Ref Range   Glucose-Capillary 524 (HH) 70 - 99 mg/dL     Comment: Glucose reference range applies only to samples taken after fasting for at least 8 hours.   Comment 1 Document in Chart   CBC     Status: Abnormal   Collection Time: 04/22/23 10:05 PM  Result Value Ref Range   WBC 12.8 (H) 4.0 - 10.5 K/uL   RBC 4.35 3.87 - 5.11 MIL/uL   Hemoglobin 13.5 12.0 - 15.0 g/dL   HCT 13.0 86.5 - 78.4 %   MCV 92.6 80.0 - 100.0 fL   MCH 31.0 26.0 - 34.0 pg   MCHC 33.5 30.0 - 36.0 g/dL   RDW 69.6 29.5 - 28.4 %   Platelets 323 150 - 400 K/uL   nRBC 0.0 0.0 - 0.2 %    Comment: Performed at [facility], [address] [place], [place] [address]  Comprehensive metabolic panel     Status: Abnormal   Collection Time: 04/22/23 10:05 PM  Result Value Ref Range   Sodium 126 (L) 135 - 145 mmol/L   Potassium 3.7 3.5 - 5.1 mmol/L   Chloride 90 (L) 98 - 111 mmol/L   CO2 21 (L) 22 - 32 mmol/L   Glucose, Bld 537 (HH) 70 - 99 mg/dL    Comment: CRITICAL RESULT CALLED TO, READ BACK BY AND VERIFIED WITH BELTON,K ON 04/22/23 AT 2250 BY PURDIE,J Glucose reference range applies only to samples taken after fasting for at least 8 hours.    BUN 26 (H) 6 - 20 mg/dL   Creatinine, Ser 0.10 (H) 0.44 - 1.00 mg/dL   Calcium 9.0 8.9 - 27.2 mg/dL   Total Protein 7.5 6.5 - 8.1 g/dL   Albumin 2.8 (L) 3.5 - 5.0 g/dL   AST 12 (L) 15 - 41 U/L   ALT 10 0 - 44 U/L   Alkaline Phosphatase 107 38 - 126 U/L   Total Bilirubin 0.5 0.0 - 1.2 mg/dL   GFR, Estimated 29 (L) >60 mL/min    Comment: (NOTE) Calculated using the CKD-EPI Creatinine Equation (2021)    Anion gap 15 5 - 15    Comment: Performed at [facility], [address] [place], [place] [address]  Lactic acid, plasma     Status: None   Collection Time: 04/22/23 10:05 PM  Result Value Ref Range   Lactic Acid, Venous 1.2 0.5 - 1.9 mmol/L    Comment: Performed at [facility], [address], [place], [place] [address]  Lactic acid, plasma     Status: Abnormal   Collection Time: 04/23/23  1:21 AM  Result Value Ref Range    Lactic Acid, Venous 2.1 (HH) 0.5 - 1.9 mmol/L    Comment: CRITICAL RESULT CALLED TO, READ BACK BY AND VERIFIED WITH TURNER,C ON 04/23/23 AT 4259 BY PURDIE,J Performed at [facility], [address], [place], [place] [address]   Blood culture (routine x 2)     Status: None (Preliminary result)  Collection Time: 04/23/23  1:23 AM   Specimen: BLOOD LEFT FOREARM  Result Value Ref Range   Specimen Description BLOOD LEFT FOREARM    Special Requests BOTTLES DRAWN AEROBIC AND ANAEROBIC    Culture      NO GROWTH 1 DAY Performed at [facility], [address] [place], [place] [address]    Report Status PENDING   CBG monitoring, ED     Status: Abnormal   Collection Time: 04/23/23  1:29 AM  Result Value Ref Range   Glucose-Capillary 372 (H) 70 - 99 mg/dL    Comment: Glucose reference range applies only to samples taken after fasting for at least 8 hours.  Blood culture (routine x 2)     Status: None (Preliminary result)   Collection Time: 04/23/23  1:33 AM   Specimen: BLOOD LEFT FOREARM  Result Value Ref Range   Specimen Description BLOOD LEFT FOREARM    Special Requests BOTTLES DRAWN AEROBIC AND ANAEROBIC    Culture      NO GROWTH 1 DAY Performed at [facility], [address], [place], [place] [address]    Report Status PENDING   Surgical PCR screen     Status: None   Collection Time: 04/23/23  3:52 AM   Specimen: Nasal Mucosa; Nasal Swab  Result Value Ref Range   MRSA, PCR NEGATIVE NEGATIVE   Staphylococcus aureus NEGATIVE NEGATIVE    Comment: (NOTE) The Xpert SA Assay (FDA approved for NASAL specimens in patients 61 years of age and older), is one component of a comprehensive surveillance program. It is not intended to diagnose infection nor to guide or monitor treatment. Performed at [facility], [address] [place], [place] [address]   Comprehensive metabolic panel     Status: Abnormal   Collection Time: 04/23/23  6:03 AM  Result Value Ref Range   Sodium 129 (L) 135 - 145  mmol/L   Potassium 4.0 3.5 - 5.1 mmol/L   Chloride 93 (L) 98 - 111 mmol/L   CO2 23 22 - 32 mmol/L   Glucose, Bld 372 (H) 70 - 99 mg/dL    Comment: Glucose reference range applies only to samples taken after fasting for at least 8 hours.   BUN 25 (H) 6 - 20 mg/dL   Creatinine, Ser 9.62 (H) 0.44 - 1.00 mg/dL   Calcium 8.6 (L) 8.9 - 10.3 mg/dL   Total Protein 6.5 6.5 - 8.1 g/dL   Albumin 2.3 (L) 3.5 - 5.0 g/dL   AST 12 (L) 15 - 41 U/L   ALT 9 0 - 44 U/L   Alkaline Phosphatase 78 38 - 126 U/L   Total Bilirubin 0.5 0.0 - 1.2 mg/dL   GFR, Estimated 25 (L) >60 mL/min    Comment: (NOTE) Calculated using the CKD-EPI Creatinine Equation (2021)    Anion gap 13 5 - 15    Comment: Performed at [facility], [address], [place], [place] [address]  CBC     Status: Abnormal   Collection Time: 04/23/23  6:03 AM  Result Value Ref Range   WBC 16.5 (H) 4.0 - 10.5 K/uL   RBC 3.88 3.87 - 5.11 MIL/uL   Hemoglobin 12.2 12.0 - 15.0 g/dL   HCT 13.2 44.0 - 10.2 %   MCV 93.3 80.0 - 100.0 fL   MCH 31.4 26.0 - 34.0 pg   MCHC 33.7 30.0 - 36.0 g/dL   RDW 72.5 36.6 - 44.0 %   Platelets 319 150 - 400 K/uL  nRBC 0.0 0.0 - 0.2 %    Comment: Performed at [facility], [address] [place], [place] [address]  Lactic acid, plasma     Status: None   Collection Time: 04/23/23  6:03 AM  Result Value Ref Range   Lactic Acid, Venous 1.4 0.5 - 1.9 mmol/L    Comment: Performed at [facility], [address] [place], [place] [address]  Magnesium     Status: Abnormal   Collection Time: 04/23/23  6:03 AM  Result Value Ref Range   Magnesium 1.4 (L) 1.7 - 2.4 mg/dL    Comment: Performed at [facility], [address] [place], [place] [address]  Phosphorus     Status: None   Collection Time: 04/23/23  6:03 AM  Result Value Ref Range   Phosphorus 3.5 2.5 - 4.6 mg/dL    Comment: Performed at [facility], [address] [place], [place] [address]  Glucose, capillary     Status: Abnormal   Collection Time:  04/23/23  7:55 AM  Result Value Ref Range   Glucose-Capillary 303 (H) 70 - 99 mg/dL    Comment: Glucose reference range applies only to samples taken after fasting for at least 8 hours.  Aerobic/Anaerobic Culture w Gram Stain (surgical/deep wound)     Status: None (Preliminary result)   Collection Time: 04/23/23  9:17 AM   Specimen: Path fluid; Body Fluid  Result Value Ref Range   Specimen Description GROIN RIGHT    Special Requests SWAB    Gram Stain      FEW WBC PRESENT, PREDOMINANTLY PMN ABUNDANT GRAM POSITIVE COCCI IN PAIRS ABUNDANT GRAM NEGATIVE RODS    Culture      CULTURE REINCUBATED FOR BETTER GROWTH Performed at [facility], [address] [place], [place] [address]    Report Status PENDING   Glucose, capillary     Status: Abnormal   Collection Time: 04/23/23 10:12 AM  Result Value Ref Range   Glucose-Capillary 322 (H) 70 - 99 mg/dL    Comment: Glucose reference range applies only to samples taken after fasting for at least 8 hours.   Comment 1 Call MD NNP PA CNM   Glucose, capillary     Status: Abnormal   Collection Time: 04/23/23 11:16 AM  Result Value Ref Range   Glucose-Capillary 340 (H) 70 - 99 mg/dL    Comment: Glucose reference range applies only to samples taken after fasting for at least 8 hours.  Lactic acid, plasma     Status: None   Collection Time: 04/23/23 12:09 PM  Result Value Ref Range   Lactic Acid, Venous 1.3 0.5 - 1.9 mmol/L    Comment: Performed at [facility], [address], [place], [place] [address]  Glucose, capillary     Status: Abnormal   Collection Time: 04/23/23  4:27 PM  Result Value Ref Range   Glucose-Capillary 278 (H) 70 - 99 mg/dL    Comment: Glucose reference range applies only to samples taken after fasting for at least 8 hours.  Glucose, capillary     Status: Abnormal   Collection Time: 04/23/23  8:21 PM  Result Value Ref Range   Glucose-Capillary 200 (H) 70 - 99 mg/dL    Comment: Glucose reference range applies only  to samples taken after fasting for at least 8 hours.  Basic metabolic panel with GFR     Status: Abnormal   Collection Time: 04/24/23  6:33 AM  Result Value Ref Range   Sodium  130 (L) 135 - 145 mmol/L   Potassium 4.0 3.5 - 5.1 mmol/L   Chloride 94 (L) 98 - 111 mmol/L   CO2 23 22 - 32 mmol/L   Glucose, Bld 308 (H) 70 - 99 mg/dL    Comment: Glucose reference range applies only to samples taken after fasting for at least 8 hours.   BUN 28 (H) 6 - 20 mg/dL   Creatinine, Ser 9.51 (H) 0.44 - 1.00 mg/dL   Calcium 8.4 (L) 8.9 - 10.3 mg/dL   GFR, Estimated 16 (L) >60 mL/min    Comment: (NOTE) Calculated using the CKD-EPI Creatinine Equation (2021)    Anion gap 13 5 - 15    Comment: Performed at [facility], [address] [place], [place] [address]  CBC     Status: Abnormal   Collection Time: 04/24/23  6:33 AM  Result Value Ref Range   WBC 16.3 (H) 4.0 - 10.5 K/uL   RBC 3.52 (L) 3.87 - 5.11 MIL/uL   Hemoglobin 11.0 (L) 12.0 - 15.0 g/dL   HCT 60.6 (L) 30.1 - 60.1 %   MCV 92.3 80.0 - 100.0 fL   MCH 31.3 26.0 - 34.0 pg   MCHC 33.8 30.0 - 36.0 g/dL   RDW 09.3 23.5 - 57.3 %   Platelets 302 150 - 400 K/uL   nRBC 0.0 0.0 - 0.2 %    Comment: Performed at [facility], [address] [place], [place] [address]  Magnesium     Status: Abnormal   Collection Time: 04/24/23  6:33 AM  Result Value Ref Range   Magnesium 1.4 (L) 1.7 - 2.4 mg/dL    Comment: Performed at [facility], [address], [place], [place] [address]  Glucose, capillary     Status: Abnormal   Collection Time: 04/24/23  7:24 AM  Result Value Ref Range   Glucose-Capillary 272 (H) 70 - 99 mg/dL    Comment: Glucose reference range applies only to samples taken after fasting for at least 8 hours.  Glucose, capillary     Status: Abnormal   Collection Time: 04/24/23 11:39 AM  Result Value Ref Range   Glucose-Capillary 446 (H) 70 - 99 mg/dL    Comment: Glucose reference range applies only to samples taken  after fasting for at least 8 hours.   CT PELVIS WO CONTRAST Result Date: 04/23/2023 CLINICAL DATA:  evaluate for soft tissue infection right upper medial thigh/pubic EXAM: CT PELVIS WITHOUT CONTRAST TECHNIQUE: Multidetector CT imaging of the pelvis was performed following the standard protocol without intravenous contrast. RADIATION DOSE REDUCTION: This exam was performed according to the departmental dose-optimization program which includes automated exposure control, adjustment of the mA and/or kV according to patient size and/or use of iterative reconstruction technique. COMPARISON:  CT abdomen pelvis 07/28/2021 FINDINGS: Urinary Tract:  No abnormality visualized. Bowel: Colonic diverticulosis. Other unremarkable visualized pelvic bowel loops. Vascular/Lymphatic: Atherosclerotic plaque. No pathologically enlarged lymph nodes. No significant vascular abnormality seen. Reproductive:  No mass or other significant abnormality Other: No intraperitoneal free fluid. No intraperitoneal free gas. No organized fluid collection. Musculoskeletal: Subcutaneus soft tissue edema and emphysema of the right proximal medial thigh. Asymmetric enlargement of the right gracilis. Associated perimuscular fat stranding. No organized fluid collection. No suspicious lytic or blastic osseous lesions. No acute displaced fracture. Severe degenerative changes of bilateral hips. IMPRESSION: 1. Subcutaneus soft tissue edema and emphysema of the right proximal medial thigh. Asymmetric enlargement of the right gracilis-finding could  represent underlying hematoma or myositis. Correlate with clinical findings for necrotizing fasciitis. 2. Severe degenerative changes of bilateral hips. Electronically Signed   By: Tish Frederickson M.D.   On: 04/23/2023 01:07      Assessment/Plan NSTI of right groin - s/p OR debridement by Dr. Robyne Peers 04/23/23 - wound inspected at bedside today and some fibrinous material in base but no concern for ongoing  NSTI - WBC stable at 16 and afebrile, Cx pending  - would not recommend further debridement in OR at this time, would not recommend skin grafting or closure at this time  - BID wet to dry dressing changes, can continue BID to every day at home as long as wound remains clean - ok to shower prior to dressing changes  - general surgery will sign off at this time, ok to transfer back to [facility] if medically appropriate or if discharged from [facility] would recommend follow up with operative surgeon in 2-3 weeks for wound check  FEN: CM diet  VTE: would recommend SQH v LMWH ID: [email or web address]   I reviewed hospitalist notes, last 24 h vitals and pain scores, last 48 h intake and output, last 24 h labs and trends, last 24 h imaging results, and Surgery notes from [facility] .  This care required high  level of medical decision making.   Juliet Rude, [facility] Surgery 04/24/2023, 12:23 PM Please see Amion for pager number during day hours 7:00am-4:30pm

## 2023-04-24 NOTE — Plan of Care (Signed)
  Problem: Education: Goal: Knowledge of General Education information will improve Description: Including pain rating scale, medication(s)/side effects and non-pharmacologic comfort measures Outcome: Progressing   Problem: Education: Goal: Knowledge of General Education information will improve Description: Including pain rating scale, medication(s)/side effects and non-pharmacologic comfort measures Outcome: Progressing   Problem: Health Behavior/Discharge Planning: Goal: Ability to manage health-related needs will improve Outcome: Progressing   Problem: Clinical Measurements: Goal: Ability to maintain clinical measurements within normal limits will improve Outcome: Progressing Goal: Will remain free from infection Outcome: Progressing Goal: Diagnostic test results will improve Outcome: Progressing Goal: Respiratory complications will improve Outcome: Progressing Goal: Cardiovascular complication will be avoided Outcome: Progressing   Problem: Activity: Goal: Risk for activity intolerance will decrease Outcome: Progressing   Problem: Nutrition: Goal: Adequate nutrition will be maintained Outcome: Progressing   Problem: Coping: Goal: Level of anxiety will decrease Outcome: Progressing   Problem: Elimination: Goal: Will not experience complications related to bowel motility Outcome: Progressing Goal: Will not experience complications related to urinary retention Outcome: Progressing   Problem: Pain Managment: Goal: General experience of comfort will improve and/or be controlled Outcome: Progressing   Problem: Safety: Goal: Ability to remain free from injury will improve Outcome: Progressing   Problem: Skin Integrity: Goal: Risk for impaired skin integrity will decrease Outcome: Progressing   Problem: Education: Goal: Ability to describe self-care measures that may prevent or decrease complications (Diabetes Survival Skills Education) will improve Outcome:  Progressing Goal: Individualized Educational Video(s) Outcome: Progressing   Problem: Coping: Goal: Ability to adjust to condition or change in health will improve Outcome: Progressing   Problem: Fluid Volume: Goal: Ability to maintain a balanced intake and output will improve Outcome: Progressing   Problem: Health Behavior/Discharge Planning: Goal: Ability to identify and utilize available resources and services will improve Outcome: Progressing Goal: Ability to manage health-related needs will improve Outcome: Progressing   Problem: Metabolic: Goal: Ability to maintain appropriate glucose levels will improve Outcome: Progressing   Problem: Nutritional: Goal: Maintenance of adequate nutrition will improve Outcome: Progressing Goal: Progress toward achieving an optimal weight will improve Outcome: Progressing   Problem: Skin Integrity: Goal: Risk for impaired skin integrity will decrease Outcome: Progressing   Problem: Tissue Perfusion: Goal: Adequacy of tissue perfusion will improve Outcome: Progressing

## 2023-04-24 NOTE — Plan of Care (Signed)
   Problem: Coping: Goal: Level of anxiety will decrease Outcome: Progressing

## 2023-04-25 ENCOUNTER — Inpatient Hospital Stay (HOSPITAL_COMMUNITY): Payer: MEDICAID

## 2023-04-25 DIAGNOSIS — L03115 Cellulitis of right lower limb: Secondary | ICD-10-CM | POA: Diagnosis not present

## 2023-04-25 LAB — COMPREHENSIVE METABOLIC PANEL WITH GFR
ALT: 21 U/L (ref 0–44)
AST: 35 U/L (ref 15–41)
Albumin: 1.8 g/dL — ABNORMAL LOW (ref 3.5–5.0)
Alkaline Phosphatase: 178 U/L — ABNORMAL HIGH (ref 38–126)
Anion gap: 13 (ref 5–15)
BUN: 30 mg/dL — ABNORMAL HIGH (ref 6–20)
CO2: 21 mmol/L — ABNORMAL LOW (ref 22–32)
Calcium: 8.2 mg/dL — ABNORMAL LOW (ref 8.9–10.3)
Chloride: 102 mmol/L (ref 98–111)
Creatinine, Ser: 3.96 mg/dL — ABNORMAL HIGH (ref 0.44–1.00)
GFR, Estimated: 13 mL/min — ABNORMAL LOW (ref >60)
Glucose, Bld: 104 mg/dL — ABNORMAL HIGH (ref 70–99)
Potassium: 3.8 mmol/L (ref 3.5–5.1)
Sodium: 136 mmol/L (ref 135–145)
Total Bilirubin: 0.5 mg/dL (ref 0.0–1.2)
Total Protein: 6 g/dL — ABNORMAL LOW (ref 6.5–8.1)

## 2023-04-25 LAB — GLUCOSE, CAPILLARY
Glucose-Capillary: 105 mg/dL — ABNORMAL HIGH (ref 70–99)
Glucose-Capillary: 105 mg/dL — ABNORMAL HIGH (ref 70–99)
Glucose-Capillary: 198 mg/dL — ABNORMAL HIGH (ref 70–99)
Glucose-Capillary: 204 mg/dL — ABNORMAL HIGH (ref 70–99)
Glucose-Capillary: 227 mg/dL — ABNORMAL HIGH (ref 70–99)

## 2023-04-25 LAB — CBC
HCT: 32.4 % — ABNORMAL LOW (ref 36.0–46.0)
Hemoglobin: 10.9 g/dL — ABNORMAL LOW (ref 12.0–15.0)
MCH: 31.1 pg (ref 26.0–34.0)
MCHC: 33.6 g/dL (ref 30.0–36.0)
MCV: 92.3 fL (ref 80.0–100.0)
Platelets: 329 10*3/uL (ref 150–400)
RBC: 3.51 MIL/uL — ABNORMAL LOW (ref 3.87–5.11)
RDW: 13.9 % (ref 11.5–15.5)
WBC: 12.4 10*3/uL — ABNORMAL HIGH (ref 4.0–10.5)
nRBC: 0 % (ref 0.0–0.2)

## 2023-04-25 MED ORDER — ENOXAPARIN SODIUM 30 MG/0.3ML IJ SOSY
30.0000 mg | PREFILLED_SYRINGE | Freq: Every day | INTRAMUSCULAR | Status: DC
  Administered 2023-04-25 – 2023-04-27 (×3): 30 mg via SUBCUTANEOUS
  Filled 2023-04-25 (×3): qty 0.3

## 2023-04-25 MED ORDER — LACTATED RINGERS IV SOLN: INTRAVENOUS | Status: AC

## 2023-04-25 MED ORDER — LINEZOLID 600 MG PO TABS
600.0000 mg | ORAL_TABLET | Freq: Two times a day (BID) | ORAL | Status: DC
  Administered 2023-04-25: 600 mg via ORAL
  Filled 2023-04-25 (×2): qty 1

## 2023-04-25 NOTE — Progress Notes (Signed)
 PROGRESS NOTE    ELEORA SUTHERLAND  YPP:[id number] DOB: 05/29/1970 DOA: 04/22/2023 PCP: Benetta Spar, MD    Brief Narrative:  53 year old female with history of diabetes mellitus type 2, hypertension, CKD stage IIIb,  tobacco abuse presenting with right medial thigh pain.  The patient states that she noticed what she felt to be a "hair bump" starting on 04/19/2023.  She stated that over the next few days, the area increased in size with some edema and pain.  She used some Neosporin without much improvement.  She denied any fevers, chills, chest pain, shortness breath, cough, hemoptysis.  She states that she has not completely compliant with her insulin.  She continues to smoke cigarettes. In the ED, the patient had low-grade temperature.  She was initially tachycardic in 110s.  She remained hemodynamically stable.  WBC 12.8, hemoglobin 13.5, platelets 319.  Sodium 126, potassium 3.7, bicarbonate 21, serum creatinine 2.05.  LFTs unremarkable.  CT of the pelvis showed soft tissue edema and emphysema in the right proximal medial thigh concerning for possible necrotizing infection.  General surgery was consulted assist with management.  The patient was started on Zosyn, linezolid, and clindamycin. 4/7, debridement of the wound right groin at [facility] Dr. Robyne Peers Transferred to [facility] for higher level of care and suspected treatment for necrotizing fasciitis. 4/8, seen by general surgery and recommended conservative management.  Subjective: Patient seen and examined in the morning rounds.  Denies any complaints.  Mild discomfort present.  Remains afebrile.  Creatinine 3.96.  Tolerating antibiotics.   Assessment & Plan:   Cellulitis and abscess right groin: Status post incision and drainage, surgical debridement. Blood cultures negative so far. Surgical cultures growing gram-positive cocci in pairs and E. coli.  Continue linezolid and Zosyn today. Seen by surgery,  recommended conservative management.  They do not anticipate further debridement. Continue wound care.  Patient to learn wound care to do at home.  Hopefully once we have final cultures available we can transition her to oral antibiotics and discharged with outpatient follow-up.  Type 2 diabetes, uncontrolled with hyperglycemia: Recent known A1c of more than 15.  Patient is noncompliant to insulin at home.  Started on long-acting insulin and prandial insulin.  Will monitor and uptitrate.  AKI on CKD stage IIIb: Baseline creatinine about 1.5-1.6.  Patient continues to have elevated creatinine. Renal ultrasound without obstruction. Continue isotonic fluid today.  Recheck tomorrow morning.  Essential hypertension: Blood pressure stable on Norvasc.  Smoker: Counseling done.  Nicotine patch declined.     DVT prophylaxis: enoxaparin (LOVENOX) injection 30 mg Start: 04/25/23 1415 SCDs Start: 04/23/23 0543   Code Status: Full code Family Communication: None at the bedside Disposition Plan: Status is: Inpatient Remains inpatient appropriate because: Immediate postop     Consultants:  General Surgery  Procedures:  I&D  Antimicrobials:  Zyvox and Zosyn 4/7---     Objective: Vitals:   04/24/23 1451 04/24/23 2101 04/25/23 0505 04/25/23 0905  BP: (!) 139/104 121/84 (!) 149/92 (!) 146/93  Pulse:  (!) 102 94 93  Resp: 18 19 16 18   Temp: 97.7 F (36.5 C) 98 F (36.7 C) 98.8 F (37.1 C) 97.8 F (36.6 C)  TempSrc: Oral Oral    SpO2: 99% 97% 97% 98%  Weight:      Height:        Intake/Output Summary (Last 24 hours) at 04/25/2023 1337 Last data filed at 04/25/2023 0900 Gross per 24 hour  Intake 1170 ml  Output --  Net 1170 ml   Filed Weights   04/22/23 2133 04/23/23 0302 04/23/23 0800  Weight: 82.1 kg 77.5 kg 77.5 kg    Examination:  General exam: Appears calm and comfortable  Respiratory system: Clear to auscultation. Respiratory effort normal. Cardiovascular system:  S1 & S2 heard, RRR.  Gastrointestinal system: Abdomen is nondistended, soft and nontender. No organomegaly or masses felt. Normal bowel sounds heard. Central nervous system: Alert and oriented. No focal neurological deficits. Extremities: Symmetric 5 x 5 power. Skin:  Large clean-based ulcer right groin, minimal serous drainage.  Surrounding skin nontender.    Data Reviewed: I have personally reviewed following labs and imaging studies  CBC: Recent Labs  Lab 04/22/23 2205 04/23/23 0603 04/24/23 0633 04/25/23 0527  WBC 12.8* 16.5* 16.3* 12.4*  HGB 13.5 12.2 11.0* 10.9*  HCT 40.3 36.2 32.5* 32.4*  MCV 92.6 93.3 92.3 92.3  PLT 323 319 302 329   Basic Metabolic Panel: Recent Labs  Lab 04/22/23 2205 04/23/23 0603 04/24/23 0633 04/25/23 0527  NA 126* 129* 130* 136  K 3.7 4.0 4.0 3.8  CL 90* 93* 94* 102  CO2 21* 23 23 21*  GLUCOSE 537* 372* 308* 104*  BUN 26* 25* 28* 30*  CREATININE 2.05* 2.30* 3.37* 3.96*  CALCIUM 9.0 8.6* 8.4* 8.2*  MG  --  1.4* 1.4*  --   PHOS  --  3.5  --   --    GFR: Estimated Creatinine Clearance: 15.6 mL/min (A) (by C-G formula based on SCr of 3.96 mg/dL (H)). Liver Function Tests: Recent Labs  Lab 04/22/23 2205 04/23/23 0603 04/25/23 0527  AST 12* 12* 35  ALT 10 9 21   ALKPHOS 107 78 178*  BILITOT 0.5 0.5 0.5  PROT 7.5 6.5 6.0*  ALBUMIN 2.8* 2.3* 1.8*   No results for input(s): "LIPASE", "AMYLASE" in the last 168 hours. No results for input(s): "AMMONIA" in the last 168 hours. Coagulation Profile: No results for input(s): "INR", "PROTIME" in the last 168 hours. Cardiac Enzymes: No results for input(s): "CKTOTAL", "CKMB", "CKMBINDEX", "TROPONINI" in the last 168 hours. BNP (last 3 results) No results for input(s): "PROBNP" in the last 8760 hours. HbA1C: No results for input(s): "HGBA1C" in the last 72 hours. CBG: Recent Labs  Lab 04/24/23 1604 04/24/23 1952 04/25/23 0009 04/25/23 0638 04/25/23 1124  GLUCAP 78 113* 105* 105*  227*   Lipid Profile: No results for input(s): "CHOL", "HDL", "LDLCALC", "TRIG", "CHOLHDL", "LDLDIRECT" in the last 72 hours. Thyroid Function Tests: No results for input(s): "TSH", "T4TOTAL", "FREET4", "T3FREE", "THYROIDAB" in the last 72 hours. Anemia Panel: No results for input(s): "VITAMINB12", "FOLATE", "FERRITIN", "TIBC", "IRON", "RETICCTPCT" in the last 72 hours. Sepsis Labs: Recent Labs  Lab 04/22/23 2205 04/23/23 0121 04/23/23 0603 04/23/23 1209  LATICACIDVEN 1.2 2.1* 1.4 1.3    Recent Results (from the past 240 hours)  Blood culture (routine x 2)     Status: None (Preliminary result)   Collection Time: 04/23/23  1:23 AM   Specimen: BLOOD LEFT FOREARM  Result Value Ref Range Status   Specimen Description BLOOD LEFT FOREARM  Final   Special Requests BOTTLES DRAWN AEROBIC AND ANAEROBIC  Final   Culture   Final    NO GROWTH 2 DAYS Performed at [facility], [address], [place], [place] [address]    Report Status PENDING  Incomplete  Blood culture (routine x 2)     Status: None (Preliminary result)   Collection Time: 04/23/23  1:33 AM  Specimen: BLOOD LEFT FOREARM  Result Value Ref Range Status   Specimen Description BLOOD LEFT FOREARM  Final   Special Requests BOTTLES DRAWN AEROBIC AND ANAEROBIC  Final   Culture   Final    NO GROWTH 2 DAYS Performed at [facility], [address], [place], [place] [address]    Report Status PENDING  Incomplete  Surgical PCR screen     Status: None   Collection Time: 04/23/23  3:52 AM   Specimen: Nasal Mucosa; Nasal Swab  Result Value Ref Range Status   MRSA, PCR NEGATIVE NEGATIVE Final   Staphylococcus aureus NEGATIVE NEGATIVE Final    Comment: (NOTE) The Xpert SA Assay (FDA approved for NASAL specimens in patients 60 years of age and older), is one component of a comprehensive surveillance program. It is not intended to diagnose infection nor to guide or monitor treatment. Performed at [facility], [address]  [place], [place] [address]   Aerobic/Anaerobic Culture w Gram Stain (surgical/deep wound)     Status: None (Preliminary result)   Collection Time: 04/23/23  9:17 AM   Specimen: Path fluid; Body Fluid  Result Value Ref Range Status   Specimen Description GROIN RIGHT  Final   Special Requests SWAB  Final   Gram Stain   Final    FEW WBC PRESENT, PREDOMINANTLY PMN ABUNDANT GRAM POSITIVE COCCI IN PAIRS ABUNDANT GRAM NEGATIVE RODS    Culture   Final    ABUNDANT STREPTOCOCCUS ANGINOSIS FEW ESCHERICHIA COLI CULTURE REINCUBATED FOR BETTER GROWTH Performed at [facility], [address], [place], [place] [address]    Report Status PENDING  Incomplete   Organism ID, Bacteria ESCHERICHIA COLI  Final      Susceptibility   Escherichia coli - MIC*    AMPICILLIN <=2 SENSITIVE Sensitive     CEFEPIME <=0.12 SENSITIVE Sensitive     CEFTAZIDIME <=1 SENSITIVE Sensitive     CEFTRIAXONE <=0.25 SENSITIVE Sensitive     CIPROFLOXACIN >=4 RESISTANT Resistant     GENTAMICIN <=1 SENSITIVE Sensitive     IMIPENEM <=0.25 SENSITIVE Sensitive     TRIMETH/SULFA <=20 SENSITIVE Sensitive     AMPICILLIN/SULBACTAM <=2 SENSITIVE Sensitive     PIP/TAZO <=4 SENSITIVE Sensitive ug/mL    * FEW ESCHERICHIA COLI         Radiology Studies: US RENAL Result Date: 04/25/2023 CLINICAL DATA:  Acute renal failure. EXAM: RENAL / URINARY TRACT ULTRASOUND COMPLETE COMPARISON:  CT 07/28/2021 FINDINGS: Right Kidney: Renal measurements: 11.3 x 4.6 x 4.4 cm. = volume: 121.52 mL. Echogenicity within normal limits. No mass or hydronephrosis visualized. Left Kidney: Renal measurements: 10.8 x 5.7 x 4.1 cm = volume: 133.29 mL. Echogenicity within normal limits. No mass or hydronephrosis visualized. Bladder: Appears normal for degree of bladder distention. Other: None. IMPRESSION: Normal renal ultrasound. Electronically Signed   By: Signa Kell M.D.   On: 04/25/2023 05:48        Scheduled Meds:  amLODipine  5 mg Oral Daily    atorvastatin  20 mg Oral Daily   enoxaparin (LOVENOX) injection  30 mg Subcutaneous Daily   insulin aspart  0-15 Units Subcutaneous TID WC   insulin aspart  0-5 Units Subcutaneous QHS   insulin aspart  4 Units Subcutaneous TID WC   insulin glargine-yfgn  18 Units Subcutaneous Daily   linezolid  600 mg Oral Q12H   Continuous Infusions:  lactated ringers 100 mL/hr at 04/25/23 0845   piperacillin-tazobactam (ZOSYN)  IV 2.25 g (04/25/23 0451)  LOS: 2 days    Time spent: 40 minutes    Dorcas Carrow, MD [facility]

## 2023-04-25 NOTE — Plan of Care (Signed)

## 2023-04-26 DIAGNOSIS — L03115 Cellulitis of right lower limb: Secondary | ICD-10-CM | POA: Diagnosis not present

## 2023-04-26 LAB — GLUCOSE, CAPILLARY
Glucose-Capillary: 153 mg/dL — ABNORMAL HIGH (ref 70–99)
Glucose-Capillary: 250 mg/dL — ABNORMAL HIGH (ref 70–99)
Glucose-Capillary: 194 mg/dL — ABNORMAL HIGH (ref 70–99)
Glucose-Capillary: 180 mg/dL — ABNORMAL HIGH (ref 70–99)
Glucose-Capillary: 180 mg/dL — ABNORMAL HIGH (ref 70–99)

## 2023-04-26 LAB — AEROBIC/ANAEROBIC CULTURE W GRAM STAIN (SURGICAL/DEEP WOUND)

## 2023-04-26 LAB — BASIC METABOLIC PANEL WITH GFR
Anion gap: 13 (ref 5–15)
BUN: 27 mg/dL — ABNORMAL HIGH (ref 6–20)
CO2: 22 mmol/L (ref 22–32)
Calcium: 8.5 mg/dL — ABNORMAL LOW (ref 8.9–10.3)
Chloride: 103 mmol/L (ref 98–111)
Creatinine, Ser: 3.43 mg/dL — ABNORMAL HIGH (ref 0.44–1.00)
GFR, Estimated: 16 mL/min — ABNORMAL LOW (ref >60)
Glucose, Bld: 169 mg/dL — ABNORMAL HIGH (ref 70–99)
Potassium: 4.2 mmol/L (ref 3.5–5.1)
Sodium: 138 mmol/L (ref 135–145)

## 2023-04-26 MED ORDER — SODIUM CHLORIDE 0.9 % IV SOLN
2.0000 g | INTRAVENOUS | Status: DC
  Administered 2023-04-26 – 2023-04-27 (×2): 2 g via INTRAVENOUS
  Filled 2023-04-26 (×2): qty 20

## 2023-04-26 MED ORDER — LACTATED RINGERS IV SOLN: INTRAVENOUS | Status: AC

## 2023-04-26 MED ORDER — HYDRALAZINE HCL 20 MG/ML IJ SOLN
5.0000 mg | Freq: Four times a day (QID) | INTRAMUSCULAR | Status: AC | PRN
  Administered 2023-04-26: 5 mg via INTRAVENOUS
  Filled 2023-04-26: qty 1

## 2023-04-26 NOTE — Progress Notes (Signed)
 PROGRESS NOTE    Megan Mullins  JYN:[id number] DOB: June 17, 1970 DOA: 04/22/2023 PCP: Benetta Spar, MD    Brief Narrative:  53 year old female with history of diabetes mellitus type 2, hypertension, CKD stage IIIb,  tobacco abuse presenting with right medial thigh pain.  The patient states that she noticed what she felt to be a "hair bump" starting on 04/19/2023.  She stated that over the next few days, the area increased in size with some edema and pain.  She used some Neosporin without much improvement.  She denied any fevers, chills, chest pain, shortness breath, cough, hemoptysis.  She states that she has not completely compliant with her insulin.  She continues to smoke cigarettes. In the ED, the patient had low-grade temperature.  She was initially tachycardic in 110s.  She remained hemodynamically stable.  WBC 12.8, hemoglobin 13.5, platelets 319.  Sodium 126, potassium 3.7, bicarbonate 21, serum creatinine 2.05.  LFTs unremarkable.  CT of the pelvis showed soft tissue edema and emphysema in the right proximal medial thigh concerning for possible necrotizing infection.  General surgery was consulted assist with management.  The patient was started on Zosyn, linezolid, and clindamycin. 4/7, debridement of the wound right groin at [facility] Dr. Robyne Peers Transferred to [facility] for higher level of care and suspected treatment for necrotizing fasciitis. 4/8, seen by general surgery and recommended conservative management.  Subjective:  Patient seen and examined.  No overnight events.  She will have some family members to learn to do dressing changes.  Needs insulin on discharge.  Assessment & Plan:   Cellulitis and abscess right groin: Status post incision and drainage, surgical debridement. Blood cultures negative so far. Surgical cultures with Streptococcus and E. coli.  Currently on linezolid and IV Zosyn.   Antibiotic de-escalated to Rocephin 2 g daily  today.  Will monitor clinical response and anticipate home with oral antibiotics next 24 to 48 hours.   Seen by surgery, recommended conservative management.  They do not anticipate further debridement. Continue wound care.  Patient to learn wound care to do at home.   Hopefully she can manage her wounds at home on discharge.  Type 2 diabetes, uncontrolled with hyperglycemia: Recent known A1c of more than 15.  Patient is noncompliant to insulin at home.  Started on long-acting insulin and prandial insulin.  Will monitor and uptitrate.  Will need prescription on discharge.  AKI on CKD stage IIIb: Baseline creatinine about 1.5-1.6.  Patient continues to have elevated creatinine. Renal ultrasound without obstruction. Continue isotonic fluid today.  Recheck tomorrow morning.  Trending down today.  Essential hypertension: Blood pressure stable on Norvasc.  Smoker: Counseling done.  Nicotine patch declined.     DVT prophylaxis: enoxaparin (LOVENOX) injection 30 mg Start: 04/25/23 1415 SCDs Start: 04/23/23 0543   Code Status: Full code Family Communication: None at the bedside Disposition Plan: Status is: Inpatient Remains inpatient appropriate because: Immediate postop     Consultants:  General Surgery  Procedures:  I&D  Antimicrobials:  Zyvox and Zosyn 4/7--- 4/10 Rocephin 4/10---     Objective: Vitals:   04/25/23 1448 04/25/23 2019 04/26/23 0542 04/26/23 0819  BP: (!) 141/100 (!) 149/96 (!) 168/108 (!) 155/109  Pulse: 97 96 100 100  Resp: 16 18 16    Temp: 98.2 F (36.8 C) 97.8 F (36.6 C) 98.1 F (36.7 C) 97.7 F (36.5 C)  TempSrc: Oral Oral Oral   SpO2: 100% 99% 100% 100%  Weight:  Height:        Intake/Output Summary (Last 24 hours) at 04/26/2023 1252 Last data filed at 04/25/2023 1800 Gross per 24 hour  Intake 480 ml  Output --  Net 480 ml   Filed Weights   04/22/23 2133 04/23/23 0302 04/23/23 0800  Weight: 82.1 kg 77.5 kg 77.5 kg     Examination:  General exam: Appears calm and comfortable  Respiratory system: Clear to auscultation. Respiratory effort normal. Cardiovascular system: S1 & S2 heard, RRR.  Gastrointestinal system: Abdomen is nondistended, soft and nontender. No organomegaly or masses felt. Normal bowel sounds heard. Central nervous system: Alert and oriented. No focal neurological deficits. Extremities: Symmetric 5 x 5 power. Skin:  Large clean-based ulcer right groin, minimal serous drainage.  Surrounding skin nontender.    Data Reviewed: I have personally reviewed following labs and imaging studies  CBC: Recent Labs  Lab 04/22/23 2205 04/23/23 0603 04/24/23 0633 04/25/23 0527  WBC 12.8* 16.5* 16.3* 12.4*  HGB 13.5 12.2 11.0* 10.9*  HCT 40.3 36.2 32.5* 32.4*  MCV 92.6 93.3 92.3 92.3  PLT 323 319 302 329   Basic Metabolic Panel: Recent Labs  Lab 04/22/23 2205 04/23/23 0603 04/24/23 0633 04/25/23 0527 04/26/23 0452  NA 126* 129* 130* 136 138  K 3.7 4.0 4.0 3.8 4.2  CL 90* 93* 94* 102 103  CO2 21* 23 23 21* 22  GLUCOSE 537* 372* 308* 104* 169*  BUN 26* 25* 28* 30* 27*  CREATININE 2.05* 2.30* 3.37* 3.96* 3.43*  CALCIUM 9.0 8.6* 8.4* 8.2* 8.5*  MG  --  1.4* 1.4*  --   --   PHOS  --  3.5  --   --   --    GFR: Estimated Creatinine Clearance: 18.1 mL/min (A) (by C-G formula based on SCr of 3.43 mg/dL (H)). Liver Function Tests: Recent Labs  Lab 04/22/23 2205 04/23/23 0603 04/25/23 0527  AST 12* 12* 35  ALT 10 9 21   ALKPHOS 107 78 178*  BILITOT 0.5 0.5 0.5  PROT 7.5 6.5 6.0*  ALBUMIN 2.8* 2.3* 1.8*   No results for input(s): "LIPASE", "AMYLASE" in the last 168 hours. No results for input(s): "AMMONIA" in the last 168 hours. Coagulation Profile: No results for input(s): "INR", "PROTIME" in the last 168 hours. Cardiac Enzymes: No results for input(s): "CKTOTAL", "CKMB", "CKMBINDEX", "TROPONINI" in the last 168 hours. BNP (last 3 results) No results for input(s):  "PROBNP" in the last 8760 hours. HbA1C: No results for input(s): "HGBA1C" in the last 72 hours. CBG: Recent Labs  Lab 04/25/23 1714 04/25/23 2017 04/26/23 0619 04/26/23 0826 04/26/23 1134  GLUCAP 198* 204* 153* 250* 194*   Lipid Profile: No results for input(s): "CHOL", "HDL", "LDLCALC", "TRIG", "CHOLHDL", "LDLDIRECT" in the last 72 hours. Thyroid Function Tests: No results for input(s): "TSH", "T4TOTAL", "FREET4", "T3FREE", "THYROIDAB" in the last 72 hours. Anemia Panel: No results for input(s): "VITAMINB12", "FOLATE", "FERRITIN", "TIBC", "IRON", "RETICCTPCT" in the last 72 hours. Sepsis Labs: Recent Labs  Lab 04/22/23 2205 04/23/23 0121 04/23/23 0603 04/23/23 1209  LATICACIDVEN 1.2 2.1* 1.4 1.3    Recent Results (from the past 240 hours)  Blood culture (routine x 2)     Status: None (Preliminary result)   Collection Time: 04/23/23  1:23 AM   Specimen: BLOOD LEFT FOREARM  Result Value Ref Range Status   Specimen Description BLOOD LEFT FOREARM  Final   Special Requests BOTTLES DRAWN AEROBIC AND ANAEROBIC  Final   Culture   Final  NO GROWTH 3 DAYS Performed at [facility], [address] [place], [place] [address]    Report Status PENDING  Incomplete  Blood culture (routine x 2)     Status: None (Preliminary result)   Collection Time: 04/23/23  1:33 AM   Specimen: BLOOD LEFT FOREARM  Result Value Ref Range Status   Specimen Description BLOOD LEFT FOREARM  Final   Special Requests BOTTLES DRAWN AEROBIC AND ANAEROBIC  Final   Culture   Final    NO GROWTH 3 DAYS Performed at [facility], [address], [place], [place] [address]    Report Status PENDING  Incomplete  Surgical PCR screen     Status: None   Collection Time: 04/23/23  3:52 AM   Specimen: Nasal Mucosa; Nasal Swab  Result Value Ref Range Status   MRSA, PCR NEGATIVE NEGATIVE Final   Staphylococcus aureus NEGATIVE NEGATIVE Final    Comment: (NOTE) The Xpert SA Assay (FDA approved for NASAL specimens  in patients 81 years of age and older), is one component of a comprehensive surveillance program. It is not intended to diagnose infection nor to guide or monitor treatment. Performed at [facility], [address] [place], [place] [address]   Aerobic/Anaerobic Culture w Gram Stain (surgical/deep wound)     Status: None (Preliminary result)   Collection Time: 04/23/23  9:17 AM   Specimen: Path fluid; Body Fluid  Result Value Ref Range Status   Specimen Description GROIN RIGHT  Final   Special Requests SWAB  Final   Gram Stain   Final    FEW WBC PRESENT, PREDOMINANTLY PMN ABUNDANT GRAM POSITIVE COCCI IN PAIRS ABUNDANT GRAM NEGATIVE RODS    Culture   Final    ABUNDANT STREPTOCOCCUS ANGINOSIS FEW ESCHERICHIA COLI HOLDING FOR POSSIBLE ANAEROBE Performed at [facility], [address], [place], [place] [address]    Report Status PENDING  Incomplete   Organism ID, Bacteria ESCHERICHIA COLI  Final   Organism ID, Bacteria STREPTOCOCCUS ANGINOSIS  Final      Susceptibility   Escherichia coli - MIC*    AMPICILLIN <=2 SENSITIVE Sensitive     CEFEPIME <=0.12 SENSITIVE Sensitive     CEFTAZIDIME <=1 SENSITIVE Sensitive     CEFTRIAXONE <=0.25 SENSITIVE Sensitive     CIPROFLOXACIN >=4 RESISTANT Resistant     GENTAMICIN <=1 SENSITIVE Sensitive     IMIPENEM <=0.25 SENSITIVE Sensitive     TRIMETH/SULFA <=20 SENSITIVE Sensitive     AMPICILLIN/SULBACTAM <=2 SENSITIVE Sensitive     PIP/TAZO <=4 SENSITIVE Sensitive ug/mL    * FEW ESCHERICHIA COLI   Streptococcus anginosis - MIC*    PENICILLIN <=0.06 SENSITIVE Sensitive     CEFTRIAXONE <=0.12 SENSITIVE Sensitive     ERYTHROMYCIN <=0.12 SENSITIVE Sensitive     LEVOFLOXACIN 0.5 SENSITIVE Sensitive     VANCOMYCIN 0.5 SENSITIVE Sensitive     * ABUNDANT STREPTOCOCCUS ANGINOSIS         Radiology Studies: US RENAL Result Date: 04/25/2023 CLINICAL DATA:  Acute renal failure. EXAM: RENAL / URINARY TRACT ULTRASOUND COMPLETE COMPARISON:  CT  07/28/2021 FINDINGS: Right Kidney: Renal measurements: 11.3 x 4.6 x 4.4 cm. = volume: 121.52 mL. Echogenicity within normal limits. No mass or hydronephrosis visualized. Left Kidney: Renal measurements: 10.8 x 5.7 x 4.1 cm = volume: 133.29 mL. Echogenicity within normal limits. No mass or hydronephrosis visualized. Bladder: Appears normal for degree of bladder distention. Other: None. IMPRESSION: Normal renal ultrasound. Electronically Signed   By: Signa Kell  M.D.   On: 04/25/2023 05:48        Scheduled Meds:  amLODipine  5 mg Oral Daily   atorvastatin  20 mg Oral Daily   enoxaparin (LOVENOX) injection  30 mg Subcutaneous Daily   insulin aspart  0-15 Units Subcutaneous TID WC   insulin aspart  0-5 Units Subcutaneous QHS   insulin aspart  4 Units Subcutaneous TID WC   insulin glargine-yfgn  18 Units Subcutaneous Daily   Continuous Infusions:  cefTRIAXone (ROCEPHIN)  IV 2 g (04/26/23 0848)   lactated ringers       LOS: 3 days    Time spent: 40 minutes    Dorcas Carrow, MD [facility]

## 2023-04-26 NOTE — Plan of Care (Signed)

## 2023-04-27 DIAGNOSIS — L03115 Cellulitis of right lower limb: Secondary | ICD-10-CM | POA: Diagnosis not present

## 2023-04-27 LAB — BASIC METABOLIC PANEL WITH GFR
Anion gap: 12 (ref 5–15)
BUN: 21 mg/dL — ABNORMAL HIGH (ref 6–20)
CO2: 21 mmol/L — ABNORMAL LOW (ref 22–32)
Calcium: 9 mg/dL (ref 8.9–10.3)
Chloride: 107 mmol/L (ref 98–111)
Creatinine, Ser: 2.85 mg/dL — ABNORMAL HIGH (ref 0.44–1.00)
GFR, Estimated: 20 mL/min — ABNORMAL LOW (ref >60)
Glucose, Bld: 186 mg/dL — ABNORMAL HIGH (ref 70–99)
Potassium: 4 mmol/L (ref 3.5–5.1)
Sodium: 140 mmol/L (ref 135–145)

## 2023-04-27 LAB — GLUCOSE, CAPILLARY
Glucose-Capillary: 170 mg/dL — ABNORMAL HIGH (ref 70–99)
Glucose-Capillary: 209 mg/dL — ABNORMAL HIGH (ref 70–99)
Glucose-Capillary: 164 mg/dL — ABNORMAL HIGH (ref 70–99)

## 2023-04-27 MED ORDER — CEFDINIR 300 MG PO CAPS
300.0000 mg | ORAL_CAPSULE | Freq: Every day | ORAL | 0 refills | Status: AC
Start: 2023-04-27 — End: 2023-05-04

## 2023-04-27 MED ORDER — AMLODIPINE BESYLATE 5 MG PO TABS: 5.0000 mg | ORAL_TABLET | Freq: Every day | ORAL | 1 refills | Status: DC

## 2023-04-27 MED ORDER — LANCETS MISC: 1.0000 | Freq: Three times a day (TID) | 0 refills | Status: DC

## 2023-04-27 MED ORDER — PNEUMOCOCCAL 20-VAL CONJ VACC 0.5 ML IM SUSY: 0.5000 mL | PREFILLED_SYRINGE | INTRAMUSCULAR | Status: DC

## 2023-04-27 MED ORDER — INSULIN NPH (HUMAN) (ISOPHANE) 100 UNIT/ML ~~LOC~~ SUSP: 25.0000 [IU] | Freq: Two times a day (BID) | SUBCUTANEOUS | 0 refills | Status: DC

## 2023-04-27 MED ORDER — INSULIN SYRINGE-NEEDLE U-100 25G X 5/8" 1 ML MISC: 1.0000 | Freq: Three times a day (TID) | 0 refills | Status: DC

## 2023-04-27 MED ORDER — NOVOLIN 70/30 RELION (70-30) 100 UNIT/ML ~~LOC~~ SUSP: 25.0000 [IU] | Freq: Two times a day (BID) | SUBCUTANEOUS | 0 refills | Status: DC

## 2023-04-27 MED ORDER — CEFDINIR 300 MG PO CAPS
300.0000 mg | ORAL_CAPSULE | Freq: Two times a day (BID) | ORAL | 0 refills | Status: DC
Start: 2023-04-27 — End: 2023-04-27

## 2023-04-27 NOTE — Discharge Summary (Signed)
 Physician Discharge Summary  KRISTILYN COLTRANE ZOX:[id number] DOB: 1970/11/02 DOA: 04/22/2023  PCP: Benetta Spar, MD  Admit date: 04/22/2023 Discharge date: 04/27/2023  Admitted From: Home Disposition: Home with home health  Recommendations for Outpatient Follow-up:  Follow up with PCP in 1-2 weeks Please obtain BMP/CBC in one week Surgery to schedule follow-up. Plastic surgery to schedule follow-up  Home Health: RN Equipment/Devices: Dressing changes  Discharge Condition: Stable CODE STATUS: Full code Diet recommendation: Low-salt diet, low-carb diet  Discharge summary: 53 year old female with history of diabetes mellitus type 2, hypertension, CKD stage V,  tobacco abuse presenting with right medial thigh pain.  The patient states that she noticed what she felt to be a "hair bump" starting on 04/19/2023.  She stated that over the next few days, the area increased in size with some edema and pain.  She used some Neosporin without much improvement.  She denied any fevers, chills, chest pain, shortness breath, cough, hemoptysis.  She states that she has not completely compliant with her insulin.  She continues to smoke cigarettes. In the ED, the patient had low-grade temperature.  She was initially tachycardic in 110s.  She remained hemodynamically stable.  WBC 12.8, hemoglobin 13.5, platelets 319.  Sodium 126, potassium 3.7, bicarbonate 21, serum creatinine 2.05.  LFTs unremarkable.  CT of the pelvis showed soft tissue edema and emphysema in the right proximal medial thigh concerning for possible necrotizing infection.  General surgery was consulted assist with management.  The patient was started on Zosyn, linezolid, and clindamycin. 4/7, emergent debridement of the wound right groin at [facility] Dr. Robyne Peers Transferred to [facility] for higher level of care and suspected treatment for necrotizing fasciitis. 4/8, seen by general surgery and recommended conservative  management. 4/11, seen by plastic surgery, recommended to continue dressing changes at home and outpatient follow-up.  Cellulitis and abscess right groin: Status post incision and drainage, surgical debridement. Blood cultures negative so far. Surgical cultures with Streptococcus and E. coli.  Received 5 days of broad-spectrum antibiotics.   Will continue 7 additional days of cephalosporin. Seen by surgery, recommended conservative management.  They do not anticipate further debridement.  Continue wound care, dressing changes twice daily at home.   Seen by plastic surgery, Dr. Ladona Ridgel.  Recommended to continue dressing changes and outpatient follow-up.  Less likely candidate for reconstructive surgery. She will follow-up with Dr. Robyne Peers at [place].   Type 2 diabetes, uncontrolled with hyperglycemia: Recent known A1c of more than 15.  Patient is noncompliant to insulin at home.  Started on long-acting insulin and prandial insulin.   Patient was out of insulin.  Patient asked for [employer] brand insulin.  Discharged with insulin 70/30, 25 units twice daily.  Metformin on hold due to renal functions.   AKI on CKD stage IIIb: Baseline creatinine about 1.5-1.6.  Patient continues to have elevated creatinine. Renal ultrasound without obstruction. Creatinine is downtrending.  Adequate urine output.  Will need recheck in 1 week.  Hold metformin until then.   Essential hypertension: Blood pressure stable on Norvasc.  Resume.   Smoker: Counseling done.  Nicotine patch declined.  Stable for discharge.  Discharge Diagnoses:  Principal Problem:   Cellulitis of right thigh Active Problems:   Essential hypertension   Obesity (BMI 30-39.9)   Type 2 diabetes mellitus with hyperglycemia (HCC)   Lactic acidosis   Mixed hyperlipidemia   Hyperosmolar non-ketotic state due to type 2 diabetes mellitus (HCC)   Cellulitis of right lower extremity  Necrotizing soft tissue infection    Discharge  Instructions  Discharge Instructions     Diet - low sodium heart healthy   Complete by: As directed    Diet Carb Modified   Complete by: As directed    Discharge wound care:   Complete by: As directed    Take shower with soap and water , clean wound with normal saline and apply dry dressing   Increase activity slowly   Complete by: As directed       Allergies as of 04/27/2023   No Known Allergies      Medication List     STOP taking these medications    insulin aspart 100 UNIT/ML injection Commonly known as: novoLOG   Lantus SoloStar 100 UNIT/ML Solostar Pen Generic drug: insulin glargine   metFORMIN 850 MG tablet Commonly known as: GLUCOPHAGE       TAKE these medications    amLODipine 5 MG tablet Commonly known as: NORVASC Take 1 tablet (5 mg total) by mouth daily.   atorvastatin 20 MG tablet Commonly known as: LIPITOR Take 20 mg by mouth daily.   cefdinir 300 MG capsule Commonly known as: OMNICEF Take 1 capsule (300 mg total) by mouth daily for 7 days.   Insulin Syringe-Needle U-100 25G X 5/8" 1 ML Misc 1 each by Does not apply route 3 (three) times daily. May dispense any manufacturer covered by patient's insurance.   Lancets Misc 1 each by Does not apply route 3 (three) times daily. Use as directed to check blood sugar. May dispense any manufacturer covered by patient's insurance and fits patient's device.   NovoLIN 70/30 ReliOn (70-30) 100 UNIT/ML injection Generic drug: insulin NPH-regular Human Inject 25 Units into the skin 2 (two) times daily with a meal.               Discharge Care Instructions  (From admission, onward)           Start     Ordered   04/27/23 0000  Discharge wound care:       Comments: Take shower with soap and water , clean wound with normal saline and apply dry dressing   04/27/23 1412            Follow-up Information     Fanta, Wayland Salinas, MD Follow up.   Specialty: Internal Medicine Contact  information: [address] [place] [address] [phone]         [facility] Follow up.   Why: Screening to become  a patient at the [facility] information: [address] [place] [phone]        Pappayliou, Gustavus Messing, DO Follow up.   Specialty: General Surgery Contact information: [address] [place] [facility] [address] [phone]         [facility] Follow up.   Specialty: Home Health Services Why: HHRN arranged to follow for wound care (will call to schedule- anticipate first visit mid next week) Contact information: [address] [place] [address] [phone]                No Known Allergies  Consultations: General Surgery Plastic surgery   Procedures/Studies: US RENAL Result Date: 04/25/2023 CLINICAL DATA:  Acute renal failure. EXAM: RENAL / URINARY TRACT ULTRASOUND COMPLETE COMPARISON:  CT 07/28/2021 FINDINGS: Right Kidney: Renal measurements: 11.3 x 4.6 x 4.4 cm. = volume: 121.52 mL. Echogenicity  within normal limits. No mass or hydronephrosis visualized. Left Kidney: Renal measurements: 10.8 x 5.7 x 4.1 cm = volume: 133.29 mL. Echogenicity within normal limits. No mass or hydronephrosis visualized. Bladder: Appears normal for degree of bladder distention. Other: None. IMPRESSION: Normal renal ultrasound. Electronically Signed   By: Signa Kell M.D.   On: 04/25/2023 05:48   CT PELVIS WO CONTRAST Result Date: 04/23/2023 CLINICAL DATA:  evaluate for soft tissue infection right upper medial thigh/pubic EXAM: CT PELVIS WITHOUT CONTRAST TECHNIQUE: Multidetector CT imaging of the pelvis was performed following the standard protocol without intravenous contrast. RADIATION DOSE REDUCTION: This exam was performed according to the departmental dose-optimization program which includes automated exposure control, adjustment of the mA and/or kV  according to patient size and/or use of iterative reconstruction technique. COMPARISON:  CT abdomen pelvis 07/28/2021 FINDINGS: Urinary Tract:  No abnormality visualized. Bowel: Colonic diverticulosis. Other unremarkable visualized pelvic bowel loops. Vascular/Lymphatic: Atherosclerotic plaque. No pathologically enlarged lymph nodes. No significant vascular abnormality seen. Reproductive:  No mass or other significant abnormality Other: No intraperitoneal free fluid. No intraperitoneal free gas. No organized fluid collection. Musculoskeletal: Subcutaneus soft tissue edema and emphysema of the right proximal medial thigh. Asymmetric enlargement of the right gracilis. Associated perimuscular fat stranding. No organized fluid collection. No suspicious lytic or blastic osseous lesions. No acute displaced fracture. Severe degenerative changes of bilateral hips. IMPRESSION: 1. Subcutaneus soft tissue edema and emphysema of the right proximal medial thigh. Asymmetric enlargement of the right gracilis-finding could represent underlying hematoma or myositis. Correlate with clinical findings for necrotizing fasciitis. 2. Severe degenerative changes of bilateral hips. Electronically Signed   By: Tish Frederickson M.D.   On: 04/23/2023 01:07   (Echo, Carotid, EGD, Colonoscopy, ERCP)    Subjective: Patient seen and examined.  No overnight events.  Mild pain and not needing any medications.  She tells me she has 3 daughters and they can help with dressings. Called and discussed with general surgery and she will schedule follow-up. Called and discussed case with plastic surgery, seen in consultation and recommended outpatient follow-up.   Discharge Exam: Vitals:   04/27/23 0427 04/27/23 0846  BP: (!) 153/97 (!) 164/104  Pulse: 91 99  Resp: 17   Temp: 98.5 F (36.9 C) 98.1 F (36.7 C)  SpO2: 99% 97%   Vitals:   04/26/23 1451 04/26/23 1920 04/27/23 0427 04/27/23 0846  BP: (!) (P) 162/114 (!) 159/115 (!) 153/97  (!) 164/104  Pulse: (P) 98 (!) 102 91 99  Resp: (P) 18 17 17    Temp: (P) 98.3 F (36.8 C) 98 F (36.7 C) 98.5 F (36.9 C) 98.1 F (36.7 C)  TempSrc: (P) Oral Oral Oral Oral  SpO2: (P) 99% 98% 99% 97%  Weight:      Height:        General: Pt is alert, awake, not in acute distress Cardiovascular: RRR, S1/S2 +, no rubs, no gallops Respiratory: CTA bilaterally, no wheezing, no rhonchi Abdominal: Soft, NT, ND, bowel sounds + Extremities: no edema, no cyanosis  Large gaping wound with fibrinous exudates right groin.  No surrounding erythema.      The results of significant diagnostics from this hospitalization (including imaging, microbiology, ancillary and laboratory) are listed below for reference.     Microbiology: Recent Results (from the past 240 hours)  Blood culture (routine x 2)     Status: None (Preliminary result)   Collection Time: 04/23/23  1:23 AM   Specimen: BLOOD LEFT FOREARM  Result Value Ref  Range Status   Specimen Description BLOOD LEFT FOREARM  Final   Special Requests BOTTLES DRAWN AEROBIC AND ANAEROBIC  Final   Culture   Final    NO GROWTH 4 DAYS Performed at [facility], [address], [place], [place] [address]    Report Status PENDING  Incomplete  Blood culture (routine x 2)     Status: None (Preliminary result)   Collection Time: 04/23/23  1:33 AM   Specimen: BLOOD LEFT FOREARM  Result Value Ref Range Status   Specimen Description BLOOD LEFT FOREARM  Final   Special Requests BOTTLES DRAWN AEROBIC AND ANAEROBIC  Final   Culture   Final    NO GROWTH 4 DAYS Performed at [facility], [address] [place], [place] [address]    Report Status PENDING  Incomplete  Surgical PCR screen     Status: None   Collection Time: 04/23/23  3:52 AM   Specimen: Nasal Mucosa; Nasal Swab  Result Value Ref Range Status   MRSA, PCR NEGATIVE NEGATIVE Final   Staphylococcus aureus NEGATIVE NEGATIVE Final    Comment: (NOTE) The Xpert SA Assay (FDA approved for  NASAL specimens in patients 49 years of age and older), is one component of a comprehensive surveillance program. It is not intended to diagnose infection nor to guide or monitor treatment. Performed at [facility], [address] [place], [place] [address]   Aerobic/Anaerobic Culture w Gram Stain (surgical/deep wound)     Status: None   Collection Time: 04/23/23  9:17 AM   Specimen: Path fluid; Body Fluid  Result Value Ref Range Status   Specimen Description GROIN RIGHT  Final   Special Requests SWAB  Final   Gram Stain   Final    FEW WBC PRESENT, PREDOMINANTLY PMN ABUNDANT GRAM POSITIVE COCCI IN PAIRS ABUNDANT GRAM NEGATIVE RODS    Culture   Final    ABUNDANT STREPTOCOCCUS ANGINOSIS FEW ESCHERICHIA COLI RARE PREVOTELLA SPECIES BETA LACTAMASE POSITIVE Performed at [facility], [address], [place], [place] [address]    Report Status 04/26/2023 FINAL  Final   Organism ID, Bacteria ESCHERICHIA COLI  Final   Organism ID, Bacteria STREPTOCOCCUS ANGINOSIS  Final      Susceptibility   Escherichia coli - MIC*    AMPICILLIN <=2 SENSITIVE Sensitive     CEFEPIME <=0.12 SENSITIVE Sensitive     CEFTAZIDIME <=1 SENSITIVE Sensitive     CEFTRIAXONE <=0.25 SENSITIVE Sensitive     CIPROFLOXACIN >=4 RESISTANT Resistant     GENTAMICIN <=1 SENSITIVE Sensitive     IMIPENEM <=0.25 SENSITIVE Sensitive     TRIMETH/SULFA <=20 SENSITIVE Sensitive     AMPICILLIN/SULBACTAM <=2 SENSITIVE Sensitive     PIP/TAZO <=4 SENSITIVE Sensitive ug/mL    * FEW ESCHERICHIA COLI   Streptococcus anginosis - MIC*    PENICILLIN <=0.06 SENSITIVE Sensitive     CEFTRIAXONE <=0.12 SENSITIVE Sensitive     ERYTHROMYCIN <=0.12 SENSITIVE Sensitive     LEVOFLOXACIN 0.5 SENSITIVE Sensitive     VANCOMYCIN 0.5 SENSITIVE Sensitive     * ABUNDANT STREPTOCOCCUS ANGINOSIS     Labs: BNP (last 3 results) No results for input(s): "BNP" in the last 8760 hours. Basic Metabolic Panel: Recent Labs  Lab 04/23/23 0603  04/24/23 0633 04/25/23 0527 04/26/23 0452 04/27/23 0406  NA 129* 130* 136 138 140  K 4.0 4.0 3.8 4.2 4.0  CL 93* 94* 102 103 107  CO2 23 23 21* 22 21*  GLUCOSE 372* 308* 104*  169* 186*  BUN 25* 28* 30* 27* 21*  CREATININE 2.30* 3.37* 3.96* 3.43* 2.85*  CALCIUM 8.6* 8.4* 8.2* 8.5* 9.0  MG 1.4* 1.4*  --   --   --   PHOS 3.5  --   --   --   --    Liver Function Tests: Recent Labs  Lab 04/22/23 2205 04/23/23 0603 04/25/23 0527  AST 12* 12* 35  ALT 10 9 21   ALKPHOS 107 78 178*  BILITOT 0.5 0.5 0.5  PROT 7.5 6.5 6.0*  ALBUMIN 2.8* 2.3* 1.8*   No results for input(s): "LIPASE", "AMYLASE" in the last 168 hours. No results for input(s): "AMMONIA" in the last 168 hours. CBC: Recent Labs  Lab 04/22/23 2205 04/23/23 0603 04/24/23 0633 04/25/23 0527  WBC 12.8* 16.5* 16.3* 12.4*  HGB 13.5 12.2 11.0* 10.9*  HCT 40.3 36.2 32.5* 32.4*  MCV 92.6 93.3 92.3 92.3  PLT 323 319 302 329   Cardiac Enzymes: No results for input(s): "CKTOTAL", "CKMB", "CKMBINDEX", "TROPONINI" in the last 168 hours. BNP: Invalid input(s): "POCBNP" CBG: Recent Labs  Lab 04/26/23 1134 04/26/23 1703 04/26/23 2151 04/27/23 0626 04/27/23 1103  GLUCAP 194* 180* 180* 170* 209*   D-Dimer No results for input(s): "DDIMER" in the last 72 hours. Hgb A1c No results for input(s): "HGBA1C" in the last 72 hours. Lipid Profile No results for input(s): "CHOL", "HDL", "LDLCALC", "TRIG", "CHOLHDL", "LDLDIRECT" in the last 72 hours. Thyroid function studies No results for input(s): "TSH", "T4TOTAL", "T3FREE", "THYROIDAB" in the last 72 hours.  Invalid input(s): "FREET3" Anemia work up No results for input(s): "VITAMINB12", "FOLATE", "FERRITIN", "TIBC", "IRON", "RETICCTPCT" in the last 72 hours. Urinalysis    Component Value Date/Time   COLORURINE STRAW (A) 11/20/2022 1538   APPEARANCEUR CLEAR 11/20/2022 1538   LABSPEC 1.021 11/20/2022 1538   PHURINE 7.0 11/20/2022 1538   GLUCOSEU >=500 (A) 11/20/2022  1538   HGBUR MODERATE (A) 11/20/2022 1538   BILIRUBINUR NEGATIVE 11/20/2022 1538   KETONESUR NEGATIVE 11/20/2022 1538   PROTEINUR 100 (A) 11/20/2022 1538   UROBILINOGEN 0.2 03/13/2014 1940   NITRITE NEGATIVE 11/20/2022 1538   LEUKOCYTESUR NEGATIVE 11/20/2022 1538   Sepsis Labs Recent Labs  Lab 04/22/23 2205 04/23/23 0603 04/24/23 0633 04/25/23 0527  WBC 12.8* 16.5* 16.3* 12.4*   Microbiology Recent Results (from the past 240 hours)  Blood culture (routine x 2)     Status: None (Preliminary result)   Collection Time: 04/23/23  1:23 AM   Specimen: BLOOD LEFT FOREARM  Result Value Ref Range Status   Specimen Description BLOOD LEFT FOREARM  Final   Special Requests BOTTLES DRAWN AEROBIC AND ANAEROBIC  Final   Culture   Final    NO GROWTH 4 DAYS Performed at [facility], [address], [place], [place] [address]    Report Status PENDING  Incomplete  Blood culture (routine x 2)     Status: None (Preliminary result)   Collection Time: 04/23/23  1:33 AM   Specimen: BLOOD LEFT FOREARM  Result Value Ref Range Status   Specimen Description BLOOD LEFT FOREARM  Final   Special Requests BOTTLES DRAWN AEROBIC AND ANAEROBIC  Final   Culture   Final    NO GROWTH 4 DAYS Performed at [facility], [address] [place], [place] [address]    Report Status PENDING  Incomplete  Surgical PCR screen     Status: None   Collection Time: 04/23/23  3:52 AM   Specimen: Nasal Mucosa; Nasal Swab  Result  Value Ref Range Status   MRSA, PCR NEGATIVE NEGATIVE Final   Staphylococcus aureus NEGATIVE NEGATIVE Final    Comment: (NOTE) The Xpert SA Assay (FDA approved for NASAL specimens in patients 77 years of age and older), is one component of a comprehensive surveillance program. It is not intended to diagnose infection nor to guide or monitor treatment. Performed at [facility], [address], [place], [place] [address]   Aerobic/Anaerobic Culture w Gram Stain (surgical/deep wound)     Status:  None   Collection Time: 04/23/23  9:17 AM   Specimen: Path fluid; Body Fluid  Result Value Ref Range Status   Specimen Description GROIN RIGHT  Final   Special Requests SWAB  Final   Gram Stain   Final    FEW WBC PRESENT, PREDOMINANTLY PMN ABUNDANT GRAM POSITIVE COCCI IN PAIRS ABUNDANT GRAM NEGATIVE RODS    Culture   Final    ABUNDANT STREPTOCOCCUS ANGINOSIS FEW ESCHERICHIA COLI RARE PREVOTELLA SPECIES BETA LACTAMASE POSITIVE Performed at [facility], [address], [place], [place] [address]    Report Status 04/26/2023 FINAL  Final   Organism ID, Bacteria ESCHERICHIA COLI  Final   Organism ID, Bacteria STREPTOCOCCUS ANGINOSIS  Final      Susceptibility   Escherichia coli - MIC*    AMPICILLIN <=2 SENSITIVE Sensitive     CEFEPIME <=0.12 SENSITIVE Sensitive     CEFTAZIDIME <=1 SENSITIVE Sensitive     CEFTRIAXONE <=0.25 SENSITIVE Sensitive     CIPROFLOXACIN >=4 RESISTANT Resistant     GENTAMICIN <=1 SENSITIVE Sensitive     IMIPENEM <=0.25 SENSITIVE Sensitive     TRIMETH/SULFA <=20 SENSITIVE Sensitive     AMPICILLIN/SULBACTAM <=2 SENSITIVE Sensitive     PIP/TAZO <=4 SENSITIVE Sensitive ug/mL    * FEW ESCHERICHIA COLI   Streptococcus anginosis - MIC*    PENICILLIN <=0.06 SENSITIVE Sensitive     CEFTRIAXONE <=0.12 SENSITIVE Sensitive     ERYTHROMYCIN <=0.12 SENSITIVE Sensitive     LEVOFLOXACIN 0.5 SENSITIVE Sensitive     VANCOMYCIN 0.5 SENSITIVE Sensitive     * ABUNDANT STREPTOCOCCUS ANGINOSIS     Time coordinating discharge: 40 minutes  SIGNED:   Dorcas Carrow, MD  [facility] 04/27/2023, 3:44 PM

## 2023-04-27 NOTE — Progress Notes (Signed)
 Dr. Jerral Ralph notfied of pt blood pressure of 176/126 per MD pt ok to DC, she said she is taking her medication when she gets home and it probably high because she is worried about her mom who she cares for.   Wound care education done with Faithlynn and she states that she has 3 daughter who will all help her with the dressing change. She was educated by MD as well on how to do dressing change. Elyanah educated she can shower and clean wound bed with normal saline and apply the wet to dry dressing, Breland states she has been watching how they do it and she can repeat the process with her daughter since they are not present for wound care education but she can walk step by step. Wound care supplies given.   AVS teaching done including follow up appointments, where to pick up prescriptions and how to take, reasons to contact MD or call 911. Rabab verbalized understanding of all teaching.She is aware of plastics do not call in one week she is to call them. Transport chair to lobby by staff where her son is picking her up downstairs.

## 2023-04-27 NOTE — TOC Transition Note (Signed)
 Transition of Care (TOC) - Discharge Note Donn Pierini RN, BSN Transitions of Care Unit 4E- RN Case Manager See Treatment Team for direct phone # [phone] cross coverage   Patient Details  Name: Megan Mullins MRN: [id number] Date of Birth: Dec 15, 1970  Transition of Care [facility]) CM/SW Contact:  Darrold Span, RN Phone Number: 04/27/2023, 3:09 PM   Clinical Narrative:    Per MD potential d/c today, needs [facility] for wound care. Pt is un-insured   CM spoke with pt at bedside- confirmed she has no insurance- pt voiced she has applied for disability. Discussed HH needs- pt agreeable- discussed [facility] agency which pt also agreeable to if agency accepts. Explained HH visit would likely be weekly, pt states she lives with mom and also has son that can assist with wound care. Bedside RN to educate on would care instructions.   Address, phone # and PCP all confirmed, pt voiced her son will likely transport her home.   Call made to [facility]- (covering agency for [facility] this week) referral has been accepted per liaison- with anticipated start of care for mid next week.  [facility] emailed financial worksheet for pt to fill out and sign- form has been emailed back to [employer].   MD will need to place [facility] order prior to discharge.    Final next level of care: Home w Home Health Services Barriers to Discharge: Barriers Resolved   Patient Goals and CMS Choice Patient states their goals for this hospitalization and ongoing recovery are:: return home and get better CMS Medicare.gov Compare Post Acute Care list provided to:: Patient Choice offered to / list presented to : Patient      Discharge Placement               Home w/ [facility]        Discharge Plan and Services Additional resources added to the After Visit Summary for     Discharge Planning Services: CM Consult Post Acute Care Choice: Home Health          DME Arranged: N/A DME Agency: NA       HH Arranged:  RN HH Agency: [facility] Date HH Agency Contacted: 04/27/23 Time HH Agency Contacted: 1130 Representative spoke with at [facility] Agency: Clifton Custard  Social Drivers of Health (SDOH) Interventions SDOH Screenings   Food Insecurity: No Food Insecurity (04/23/2023)  Housing: Low Risk  (04/23/2023)  Transportation Needs: No Transportation Needs (04/23/2023)  Utilities: Not At Risk (04/23/2023)  Social Connections: Moderately Isolated (04/23/2023)  Tobacco Use: Medium Risk (04/23/2023)     Readmission Risk Interventions    04/27/2023   12:13 PM  Readmission Risk Prevention Plan  Transportation Screening Complete  Home Care Screening Complete  Medication Review (RN CM) Complete

## 2023-04-28 DIAGNOSIS — Z419 Encounter for procedure for purposes other than remedying health state, unspecified: Secondary | ICD-10-CM | POA: Diagnosis not present

## 2023-04-28 LAB — CULTURE, BLOOD (ROUTINE X 2)
Culture: NO GROWTH
Culture: NO GROWTH

## 2023-05-02 IMAGING — CT CT ABD-PELV W/O CM
2 of 4 series · 16 of 46 positions shown, 18 images · non-contrast
Comparison: 06/08/2021

CLINICAL DATA: Increased upper abdominal pain and tenderness this
morning. Admitted for acute liver failure, sepsis and
pyelonephritis. Clinical concern for hepatomegaly.



[Series 2: axial st · axial · 0.94mm/px · z∈[+915,+1305]mm · 13 of 90 slices shown, 15 images]
[im 6/90  soft-tissue]
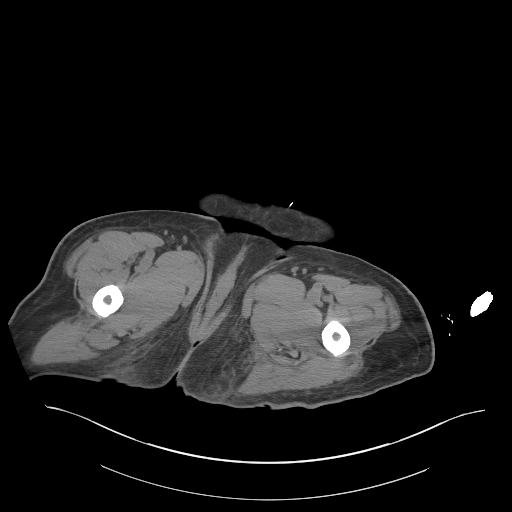
[im 6/90  bone]
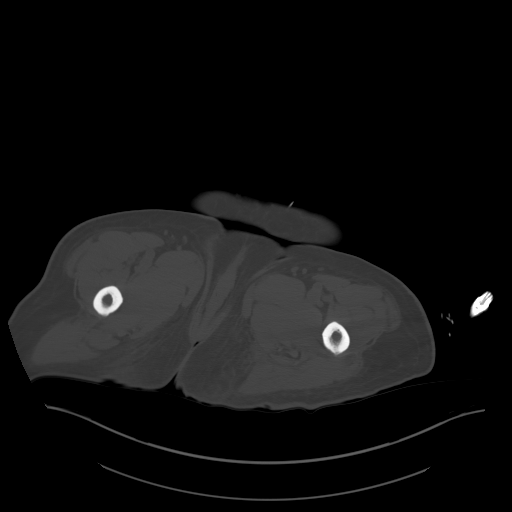
[im 12/90  soft-tissue]
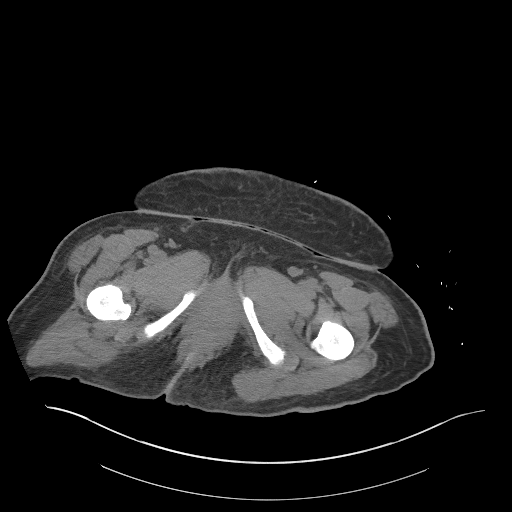
[im 17/90  soft-tissue]
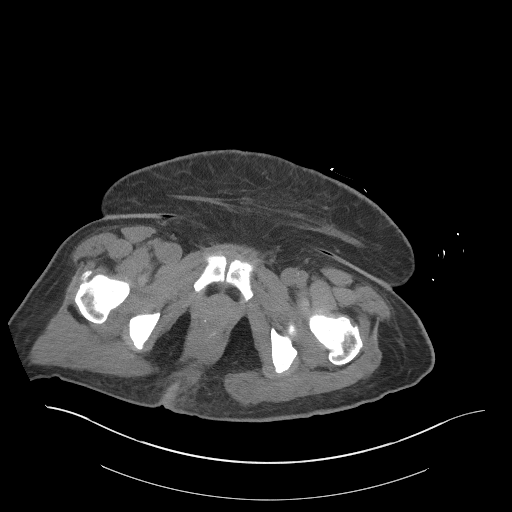
[im 28/90  soft-tissue]
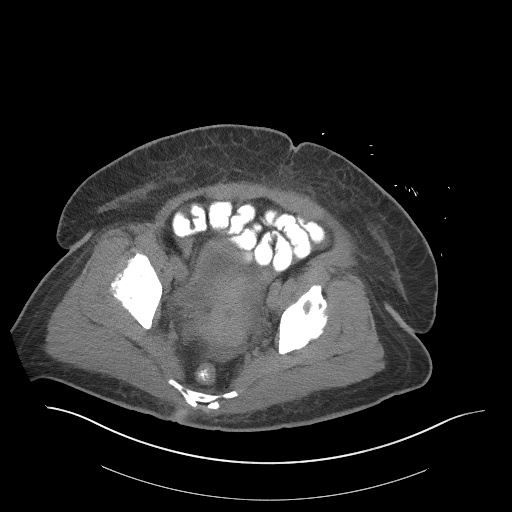
[im 34/90  soft-tissue]
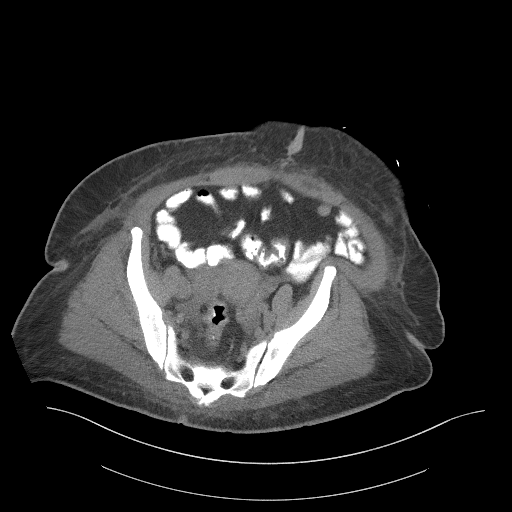
[im 39/90  soft-tissue]
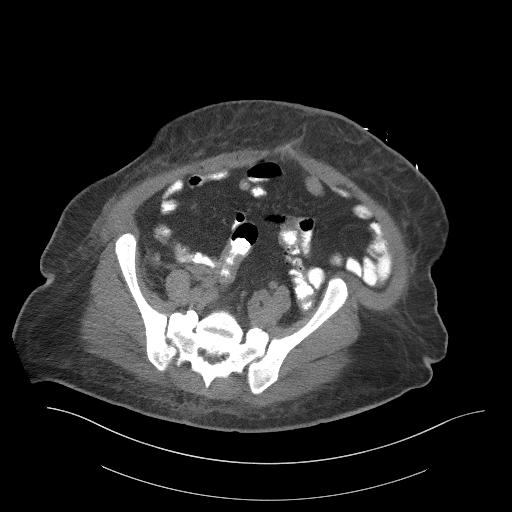
[im 45/90  soft-tissue]
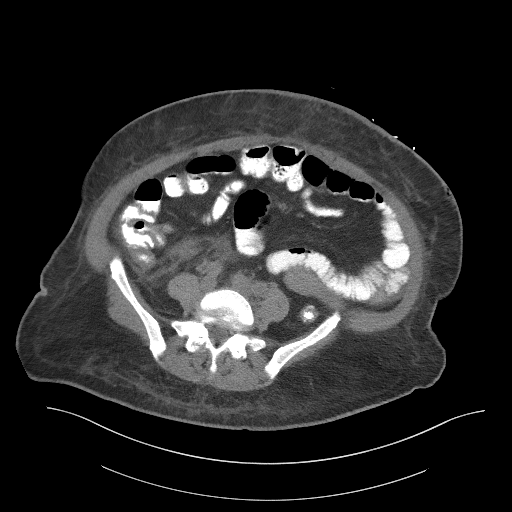
[im 51/90  soft-tissue]
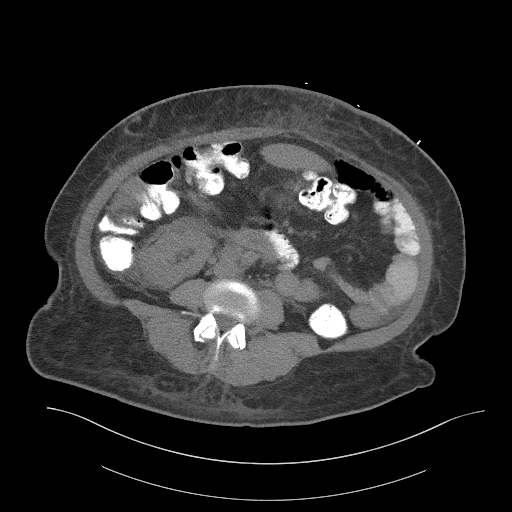
[im 56/90  soft-tissue]
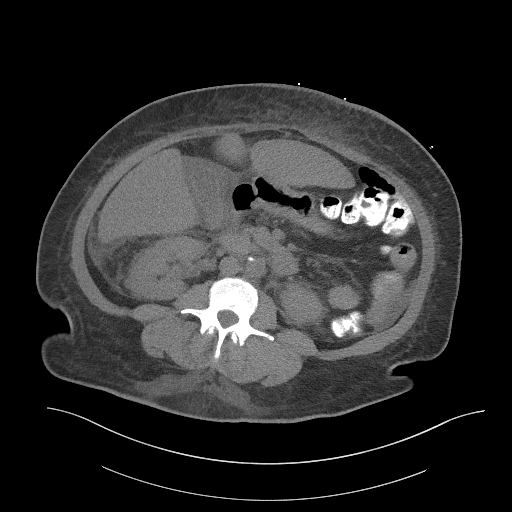
[im 56/90  bone]
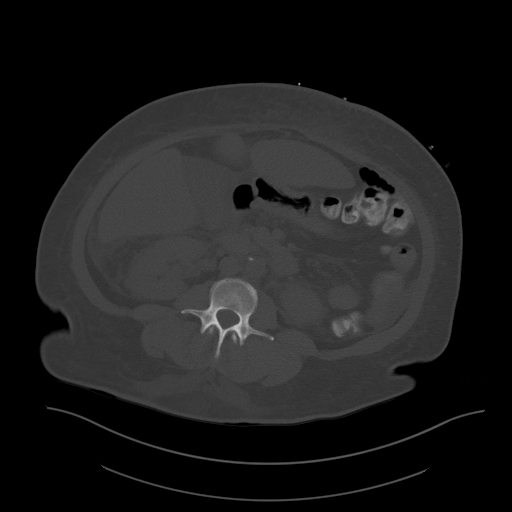
[im 62/90  soft-tissue]
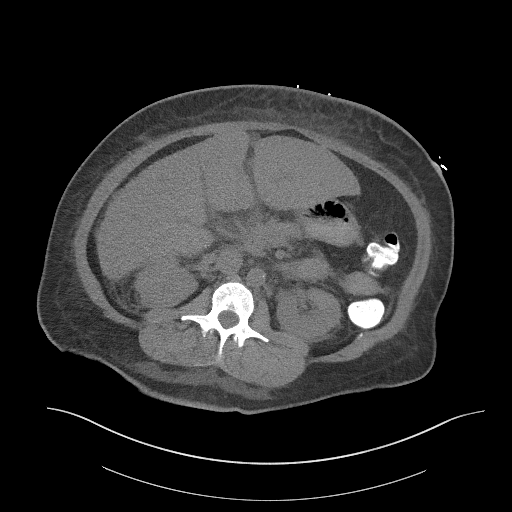
[im 73/90  soft-tissue]
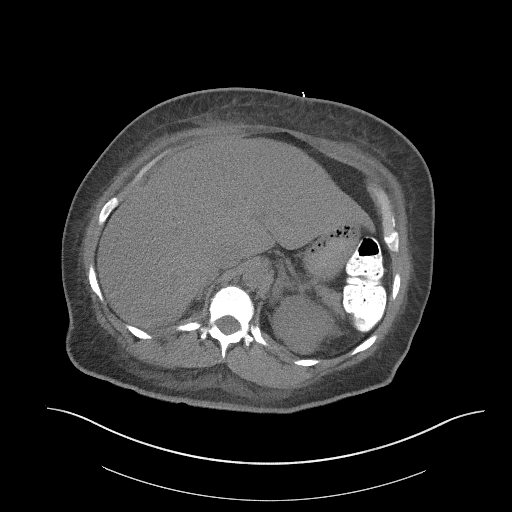
[im 78/90  soft-tissue]
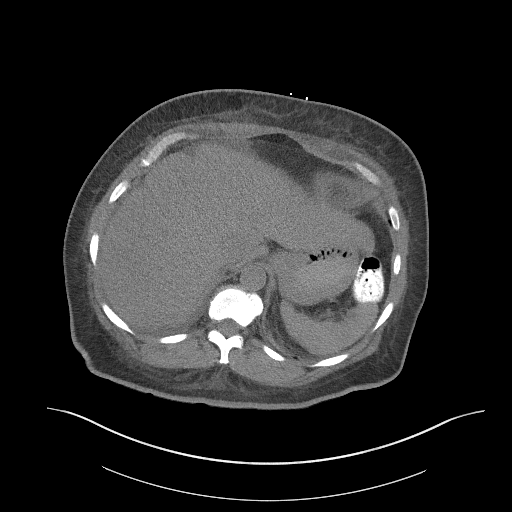
[im 84/90  soft-tissue]
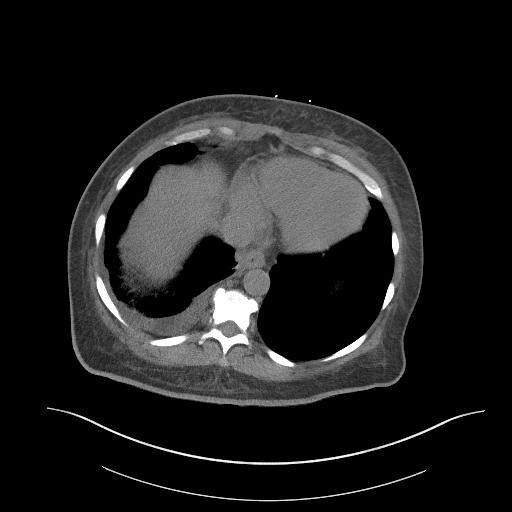

[Series 5: coronal st · coronal · 0.87mm/px · 3 of 115 slices shown]
[im 39/115  soft-tissue]
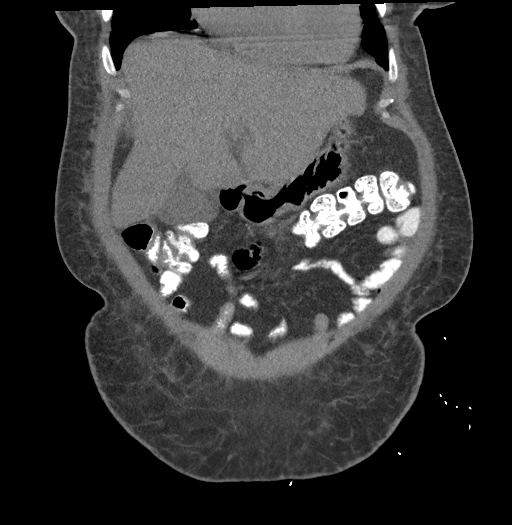
[im 51/115  soft-tissue]
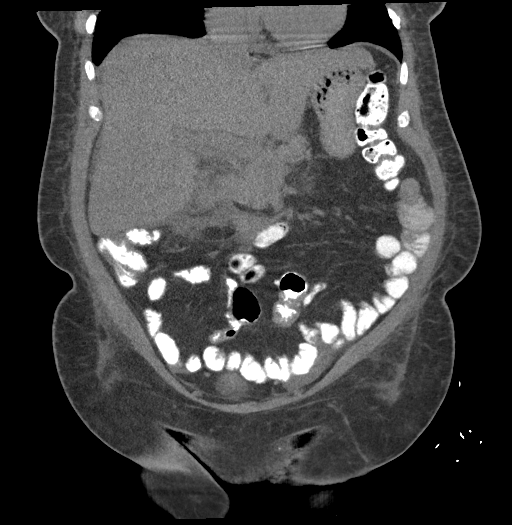
[im 64/115  soft-tissue]
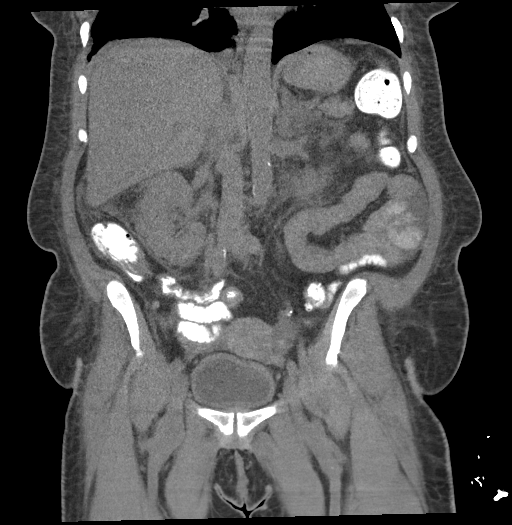

[16 of 46 positions shown; findings below may reference images not displayed]

FINDINGS: Lower chest: Interval small right pleural effusion and mild right
basilar dependent ground-glass opacity and linear density. Mildly
enlarged are without gross change. Clear left lung base.

Hepatobiliary: Stable prominence of the lateral segment of the left
lobe of the liver and caudate lobe and normal-sized right lobe. A
small cyst in the dome of the liver on the right was better
visualized on the previous examination. Interval pericholecystic
fluid and small amount of perihepatic fluid and soft tissue
stranding. No gallbladder wall thickening or gallstones visualized.

Pancreas: Unremarkable. No pancreatic ductal dilatation or
surrounding inflammatory changes.

Spleen: Normal in size without focal abnormality.

Adrenals/Urinary Tract: The left adrenal gland remains diffusely
prominent. Normal to slightly thickened right adrenal gland. No
adrenal nodules are seen.

The previously seen dilatation of the right renal collecting system
is no longer demonstrated. Mild increase in bilateral perinephric
soft tissue stranding and right paranephric fluid. No bladder or
ureteral calculi and no hydronephrosis on either side.

Stomach/Bowel: Small number of sigmoid colon diverticula without
evidence of diverticulitis. Unremarkable stomach, small bowel and
appendix.

Vascular/Lymphatic: Mild atheromatous arterial calcifications. No
enlarged lymph nodes.

Reproductive: Uterus and bilateral adnexa are unremarkable.

Other: Interval small amount of free peritoneal fluid constant true
predominantly in the pelvis. Interval diffuse subcutaneous edema.

Musculoskeletal: Lumbar and lower thoracic spine degenerative
changes. Previously noted bilateral hip degenerative changes,
greater on the left.
IMPRESSION: 1. Interval diffuse anasarca with a small right pleural effusion,
small amount of ascites and diffuse subcutaneous edema.
2. Resolved right hydronephrosis.
3. Minimal pulmonary edema or dependent atelectasis in the right
lower lobe.

## 2023-05-04 ENCOUNTER — Emergency Department (HOSPITAL_COMMUNITY)
Admission: EM | Admit: 2023-05-04 | Discharge: 2023-05-04 | Disposition: A | Discharge status: Home / Self Care | Attending: Emergency Medicine | Admitting: Emergency Medicine

## 2023-05-04 ENCOUNTER — Encounter (HOSPITAL_COMMUNITY): Payer: Self-pay

## 2023-05-04 ENCOUNTER — Other Ambulatory Visit: Payer: Self-pay

## 2023-05-04 DIAGNOSIS — Z79899 Other long term (current) drug therapy: Secondary | ICD-10-CM | POA: Insufficient documentation

## 2023-05-04 DIAGNOSIS — I10 Essential (primary) hypertension: Secondary | ICD-10-CM | POA: Diagnosis not present

## 2023-05-04 DIAGNOSIS — E119 Type 2 diabetes mellitus without complications: Secondary | ICD-10-CM | POA: Diagnosis not present

## 2023-05-04 DIAGNOSIS — Z4800 Encounter for change or removal of nonsurgical wound dressing: Secondary | ICD-10-CM | POA: Diagnosis not present

## 2023-05-04 DIAGNOSIS — Z4801 Encounter for change or removal of surgical wound dressing: Secondary | ICD-10-CM | POA: Diagnosis not present

## 2023-05-04 DIAGNOSIS — Z794 Long term (current) use of insulin: Secondary | ICD-10-CM | POA: Diagnosis not present

## 2023-05-04 DIAGNOSIS — Z5189 Encounter for other specified aftercare: Secondary | ICD-10-CM

## 2023-05-04 IMAGING — US US HEPATIC LIVER DOPPLER
1 series · 14 of 25 positions shown · non-contrast
Comparison: CT abdomen pelvis 06/11/2021

CLINICAL DATA: Abdominal pain for 1 week

EXAM:
DUPLEX ULTRASOUND OF LIVER
TECHNIQUE: Color and duplex Doppler ultrasound was performed to evaluate the
hepatic in-flow and out-flow vessels.

[Series 1: us liver doppler · 14 of 103 slices shown]
[im 1/103]
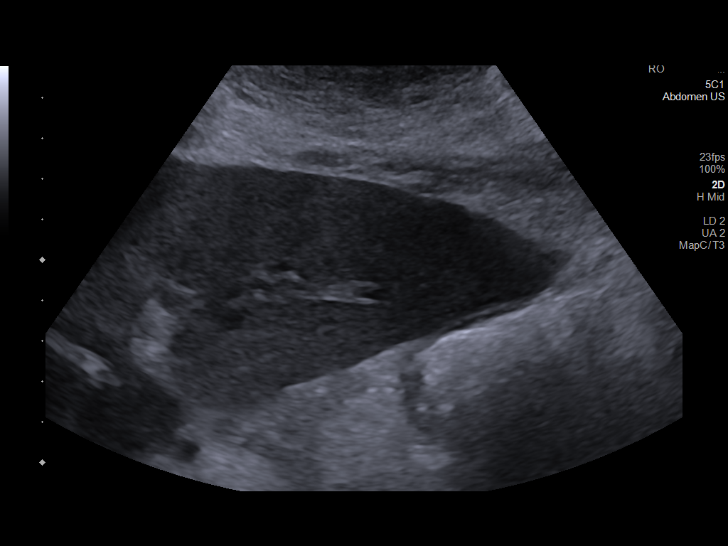
[im 9/103]
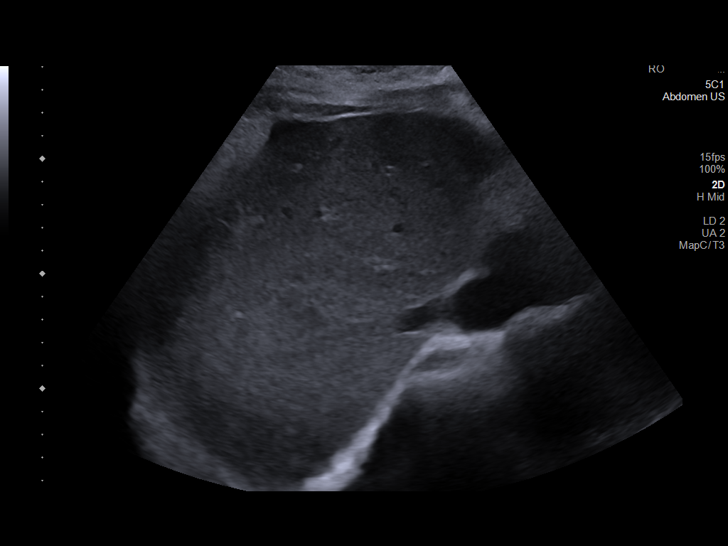
[im 18/103]
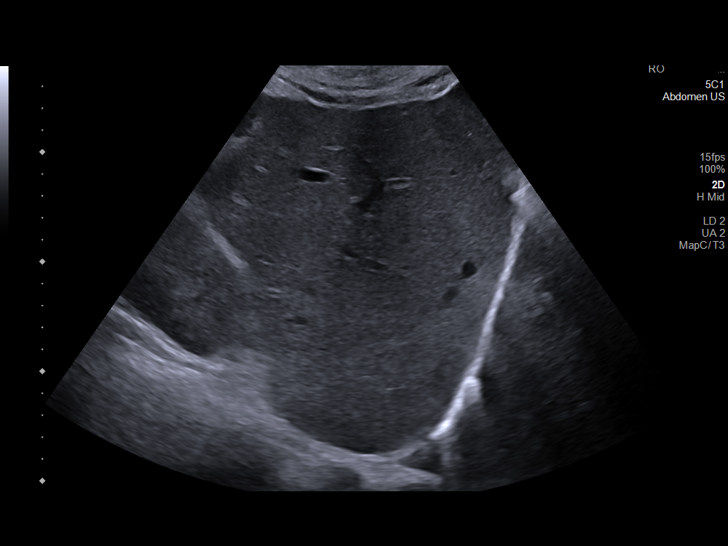
[im 26/103]
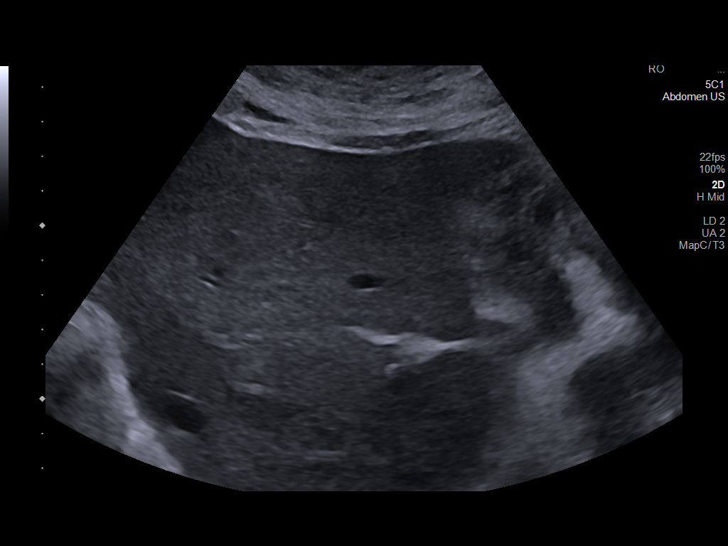
[im 35/103]
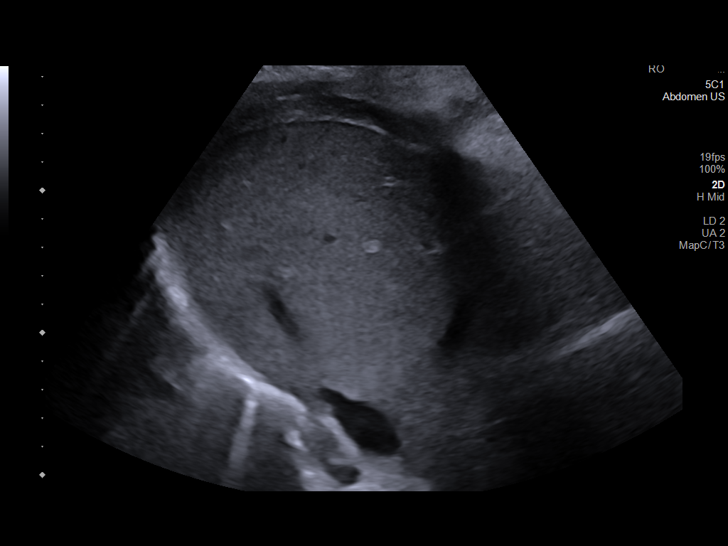
[im 39/103]
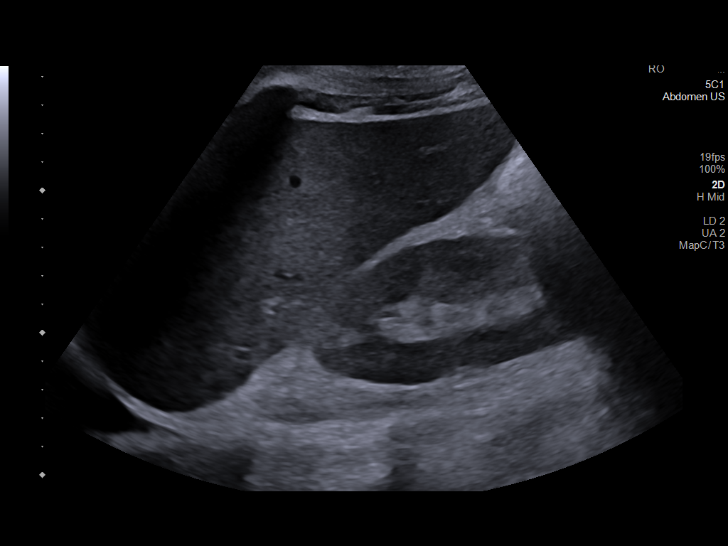
[im 47/103]
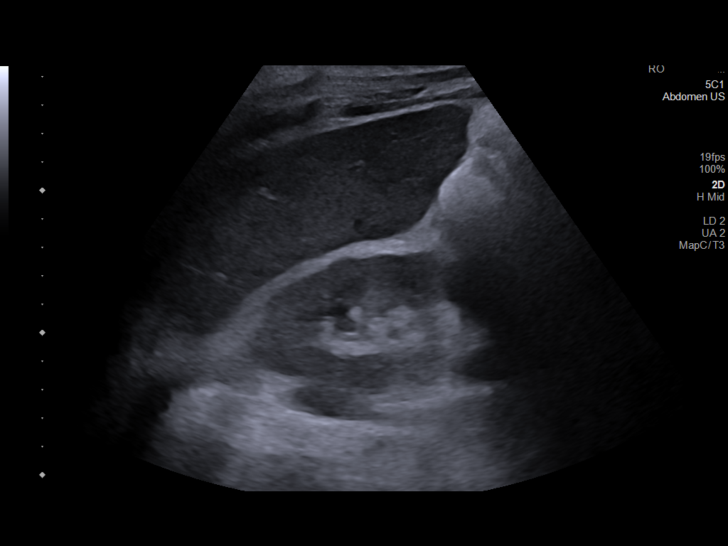
[im 56/103]
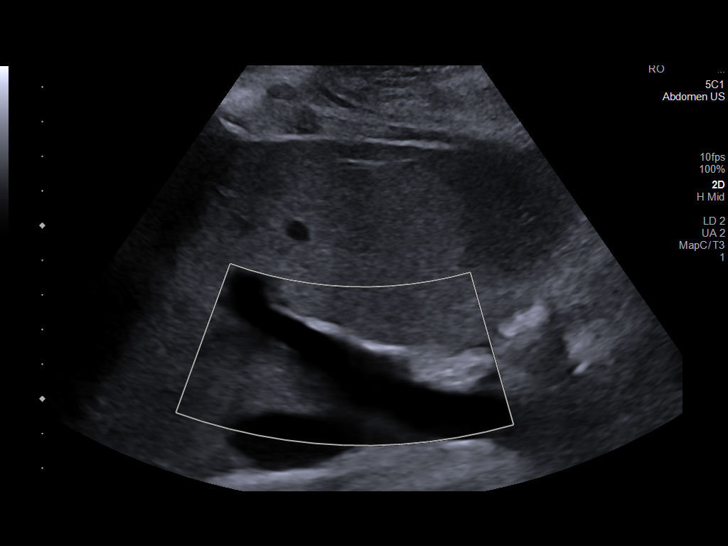
[im 64/103]
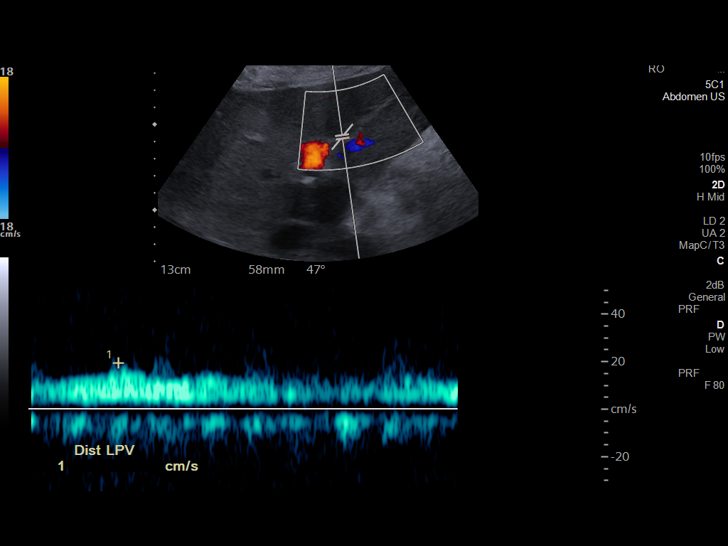
[im 69/103]
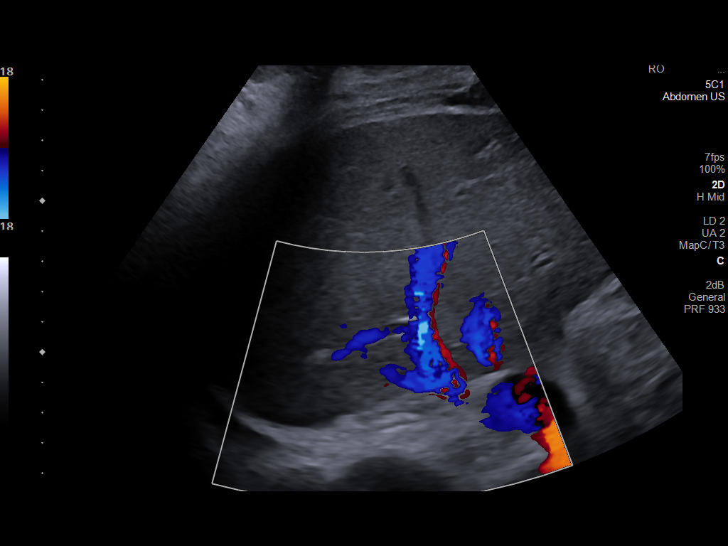
[im 77/103]
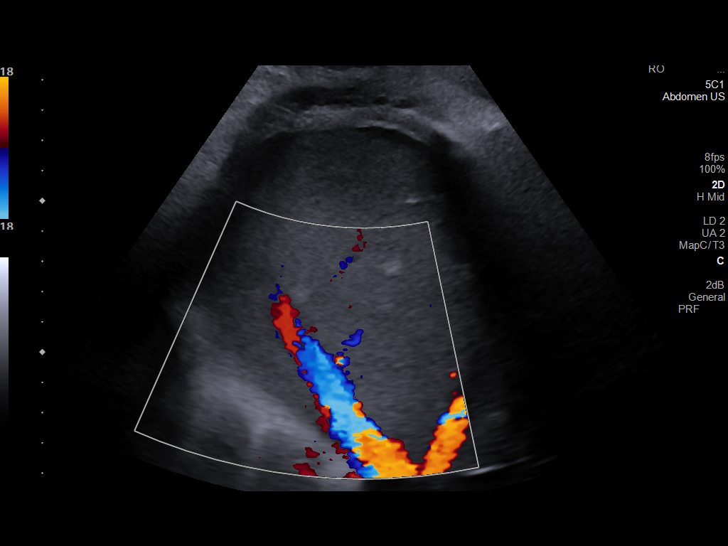
[im 86/103]
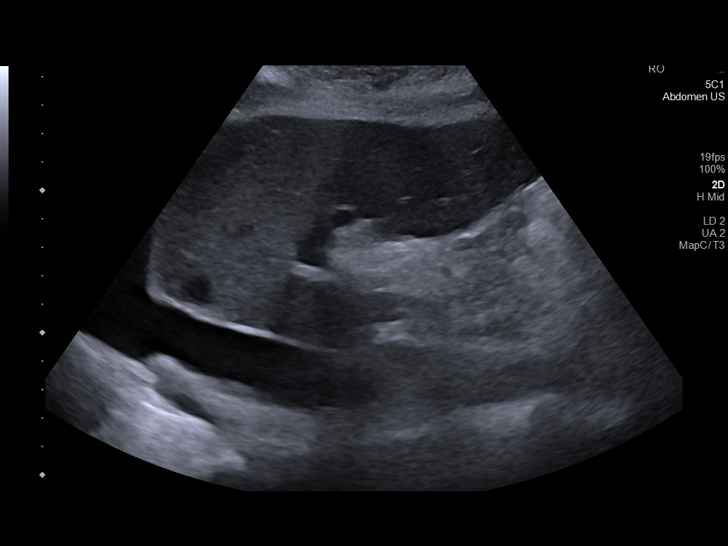
[im 94/103]
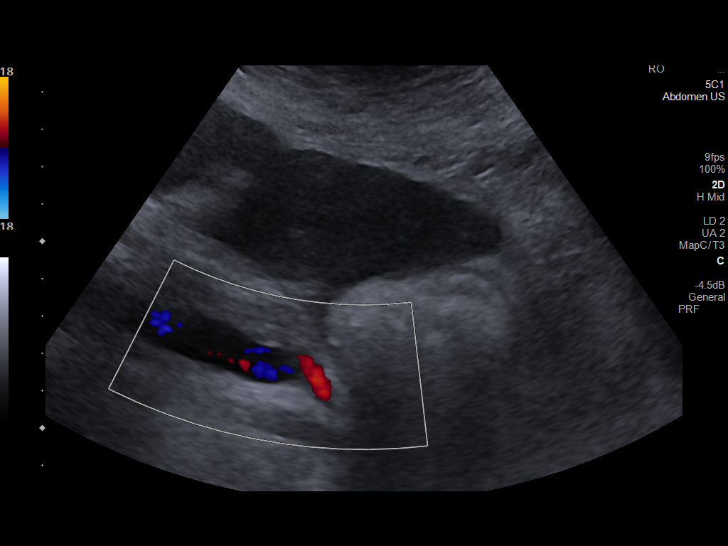
[im 103/103]
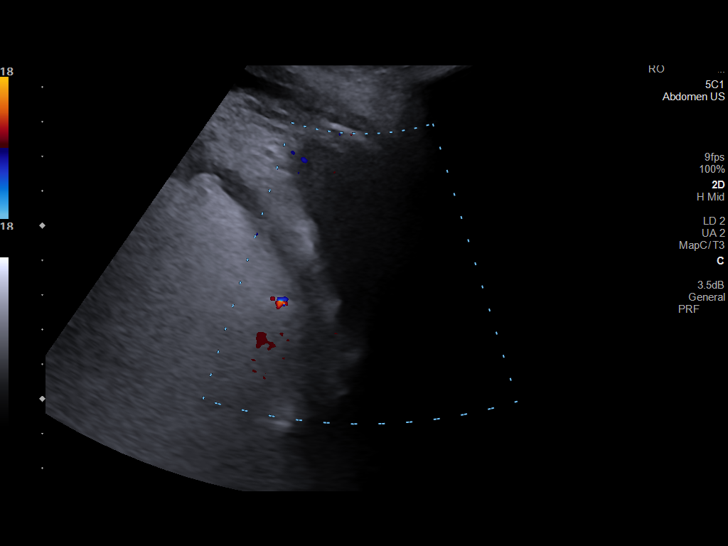

[14 of 25 positions shown; findings below may reference images not displayed]

FINDINGS: Liver: Normal parenchymal echogenicity. Normal hepatic contour
without nodularity.

No focal lesion, mass or intrahepatic biliary ductal dilatation.

Main Portal Vein size: 1.1 cm

Portal Vein Velocities

Main Prox:  35 cm/sec

Main Mid: 38 cm/sec

Main Dist:  29 cm/sec
Right: 34 cm/sec
Left: 21 cm/sec

Hepatic Vein Velocities

Right:  69 cm/sec

Middle:  61 cm/sec

Left:  61 cm/sec

IVC: Present and patent with normal respiratory phasicity.

Hepatic Artery Velocity:  22 cm/sec

Splenic Vein Velocity:  57 cm/sec

Spleen: 8.8 cm x 2.7 cm x 2.0 cm with a total volume of 26 cm^3
(411 cm^3 is upper limit normal)

Portal Vein Occlusion/Thrombus: No

Splenic Vein Occlusion/Thrombus: No

Ascites: Trace perihepatic ascites.

Varices: None
IMPRESSION: 1. Patent portal vein with appropriate direction of flow.
2. Trace perihepatic ascites.

## 2023-05-04 NOTE — ED Provider Notes (Signed)
 [place] EMERGENCY DEPARTMENT AT [facility] Provider Note   CSN: [id number] Arrival date & time: 05/04/23  2005     History  Chief Complaint  Patient presents with   Wound Check    Megan Mullins is a 53 y.o. female.  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  Presents to the ER today for wound check.  She states she had surgery for an infection in the right groin by Dr. Evonnie on 4/7, she has been packing the wound at home as directed and has had no difficulties, she states she ran out of the packing, her wound care nurse looked at the wound today and was concerned that it could be infected and wanted her to have it evaluated.  She has no pain, no increased drainage, no fevers or chills and states she thinks that wound looks fine, but was told to come in for check.  She also is requesting more gauze for packing as she is out.   Wound Check       Home Medications Prior to Admission medications   Medication Sig Start Date End Date Taking? Authorizing Provider  amLODipine  (NORVASC ) 5 MG tablet Take 1 tablet (5 mg total) by mouth daily. 04/27/23   Raenelle Coria, MD  atorvastatin  (LIPITOR) 20 MG tablet Take 20 mg by mouth daily. 07/19/22   [provider]  cefdinir  (OMNICEF ) 300 MG capsule Take 1 capsule (300 mg total) by mouth daily for 7 days. 04/27/23 05/04/23  Raenelle Coria, MD  insulin  NPH-regular Human (NOVOLIN  70/30 RELION) (70-30) 100 UNIT/ML injection Inject 25 Units into the skin 2 (two) times daily with a meal. 04/27/23   Raenelle Coria, MD  Insulin  Syringe-Needle U-100 25G X 5/8 1 ML MISC 1 each by Does not apply route 3 (three) times daily. May dispense any manufacturer covered by patient's insurance. 04/27/23   Raenelle Coria, MD  Lancets MISC 1 each by Does not apply route 3 (three) times daily. Use as directed to check blood sugar. May dispense any manufacturer covered  by patient's insurance and fits patient's device. 04/27/23   Raenelle Coria, MD      Allergies    Patient has no known allergies.    Review of Systems   Review of Systems  Physical Exam Updated Vital Signs BP (!) 153/106 (BP Location: Right Arm)   Pulse (!) 111   Temp 98.4 F (36.9 C) (Oral)   Resp 16   Ht 5' (1.524 m)   Wt 77.5 kg   LMP 07/18/2021 (Approximate)   SpO2 96%   BMI 33.37 kg/m  Physical Exam Vitals and nursing note reviewed.  Constitutional:      General: She is not in acute distress.    Appearance: She is well-developed.  HENT:     Head: Normocephalic and atraumatic.  Eyes:     Conjunctiva/sclera: Conjunctivae normal.  Cardiovascular:     Rate and Rhythm: Normal rate and regular rhythm.     Heart sounds: No murmur heard. Pulmonary:     Effort: Pulmonary effort is normal. No respiratory distress.     Breath sounds: Normal breath sounds.  Abdominal:     Palpations: Abdomen is soft.     Tenderness: There is no abdominal tenderness.  Musculoskeletal:        General: No swelling.     Cervical back: Neck supple.  Skin:    General: Skin is warm and dry.  Capillary Refill: Capillary refill takes less than 2 seconds.     Comments: Wound as pictured, appears to be healthy granulation tissue with some scant drainage.  No crepitus, no induration, no erythema or warmth surrounding the wound in the right inguinal area  Neurological:     Mental Status: She is alert.  Psychiatric:        Mood and Affect: Mood normal.     ED Results / Procedures / Treatments   Labs (all labs ordered are listed, but only abnormal results are displayed) Labs Reviewed - No data to display  EKG None  Radiology No results found.  Procedures Procedures    Medications Ordered in ED Medications - No data to display  ED Course/ Medical Decision Making/ A&P                                 Medical Decision Making Differential diagnosis includes but not limited to wound  infection, sepsis, other ED course: Patient here for wound check and would like gauze for wound packing.  Her wound care nurse felt her wound looks infected.  She is having no systemic symptoms, well-appearing.  Wound looks good, normal granulation tissue with some scant drainage.  Patient states she has been using regular dry gauze for the packing and was provided with this to go home.  Of note patient was tachycardic on initial vital signs, requested this to be repeated prior to discharge, I was informed by the nurse that another nurse had discharged her before obtaining repeat vitals unfortunately,.  On exam, she is not tachycardic when I was auscultating her heart and lungs.  She is not tachypneic.  She is afebrile.  She was advised on follow-up with surgeon and strict return precautions.           Final Clinical Impression(s) / ED Diagnoses Final diagnoses:  Visit for wound check    Rx / DC Orders ED Discharge Orders     None         Suellen Sherran LABOR, PA-C 05/04/23 2337    Elnor Savant A, DO 05/08/23 0715

## 2023-05-04 NOTE — ED Notes (Addendum)
 Wound cleansed and dressed. Discharge instructions given patient verbalized understanding and had no further questions

## 2023-05-04 NOTE — Discharge Instructions (Signed)
 The pleasure taking care of you.  You are seen for a wound check after running out of gauze.  Your wound does not appear to be infected, we have given you gauze to go home, make sure you follow-up with the surgeon and come back to the ER if you develop fever, pain, increased drainage or redness to the site or any other worrisome changes.

## 2023-05-04 NOTE — ED Triage Notes (Signed)
 Pt reports she had a wound debridement done recently and she ran out of gauze today and her home health nurse told her it looked infected so she needed to come to the ER.

## 2023-05-10 DIAGNOSIS — M79672 Pain in left foot: Secondary | ICD-10-CM | POA: Diagnosis not present

## 2023-05-10 DIAGNOSIS — E1122 Type 2 diabetes mellitus with diabetic chronic kidney disease: Secondary | ICD-10-CM | POA: Diagnosis not present

## 2023-05-10 DIAGNOSIS — E1165 Type 2 diabetes mellitus with hyperglycemia: Secondary | ICD-10-CM | POA: Diagnosis not present

## 2023-05-10 DIAGNOSIS — L03115 Cellulitis of right lower limb: Secondary | ICD-10-CM | POA: Diagnosis not present

## 2023-05-10 DIAGNOSIS — I10 Essential (primary) hypertension: Secondary | ICD-10-CM | POA: Diagnosis not present

## 2023-05-15 ENCOUNTER — Encounter: Payer: Self-pay | Admitting: Surgery

## 2023-05-15 ENCOUNTER — Ambulatory Visit (INDEPENDENT_AMBULATORY_CARE_PROVIDER_SITE_OTHER): Payer: Self-pay | Admitting: Surgery

## 2023-05-15 VITALS — BP 151/111 | HR 106 | Temp 97.9°F | Resp 16 | Ht 60.0 in | Wt 170.0 lb

## 2023-05-15 DIAGNOSIS — S31109D Unspecified open wound of abdominal wall, unspecified quadrant without penetration into peritoneal cavity, subsequent encounter: Secondary | ICD-10-CM

## 2023-05-20 NOTE — Progress Notes (Signed)
 [facility] Note   HPI:  53 y.o. Female presents to clinic for post-op follow-up s/p incision, drainage, and debridement of a right groin necrotizing soft tissue infection on 4/7.  She has been doing well since being discharged home from the hospital.  She has been performing home dressing changes herself.  She did have her wound evaluated in the ED on 4/18, as she was told by a wound care nurse that the wound looked infected.  She denies fevers and chills at home.  She has had clear, yellow drainage from the area.  Review of Systems:  All other review of systems: otherwise negative   Vital Signs:  BP (!) 151/111   Pulse (!) 106   Temp 97.9 F (36.6 C) (Oral)   Resp 16   Ht 5' (1.524 m)   Wt 170 lb (77.1 kg)   LMP 07/18/2021 (Approximate)   SpO2 94%   BMI 33.20 kg/m    Physical Exam:  Physical Exam Vitals reviewed.  Constitutional:      Appearance: Normal appearance.  Skin:    General: Skin is warm and dry.     Comments: Right groin wound with pink granulation tissue at the base, no evidence of active infection, minimally tender to palpation, wound measures 8 x 4 x 2 cm  Neurological:     Mental Status: She is alert.     Laboratory studies: None   Imaging:  None   Assessment:  53 y.o. yo Female who presents for follow-up status post incision, drainage, and debridement of the right groin necrotizing soft tissue infection on 4/7  Plan:  - Overall the patient has been doing well since surgery.  She is performing daily dressing changes at home - Advised that she should continue with daily wet-to-dry dressing changes to the wound - Will refer patient to wound care for assistance with management of the wound.  Will also refer patient to plastic surgery to evaluate if she is a candidate for wound closure versus flap -Discussed with the patient that if she is not a candidate for any further surgeries to this area, it may take several months for this area to  fully heal given the size of the wound - Follow up with me in 1 month  All of the above recommendations were discussed with the patient, and all of patient's questions were answered to her expressed satisfaction.  Note: Portions of this report may have been transcribed using voice recognition software. Every effort has been made to ensure accuracy; however, inadvertent computerized transcription errors may still be present.   Lidia Reels, DO [facility] [address] [place], [place] [address] [phone] (office)

## 2023-05-25 ENCOUNTER — Encounter (HOSPITAL_BASED_OUTPATIENT_CLINIC_OR_DEPARTMENT_OTHER): Attending: Internal Medicine | Admitting: Internal Medicine

## 2023-05-25 DIAGNOSIS — M726 Necrotizing fasciitis: Secondary | ICD-10-CM | POA: Insufficient documentation

## 2023-05-25 DIAGNOSIS — E1165 Type 2 diabetes mellitus with hyperglycemia: Secondary | ICD-10-CM | POA: Diagnosis not present

## 2023-05-25 DIAGNOSIS — X58XXXA Exposure to other specified factors, initial encounter: Secondary | ICD-10-CM | POA: Insufficient documentation

## 2023-05-25 DIAGNOSIS — S71101A Unspecified open wound, right thigh, initial encounter: Secondary | ICD-10-CM | POA: Insufficient documentation

## 2023-05-25 DIAGNOSIS — L97812 Non-pressure chronic ulcer of other part of right lower leg with fat layer exposed: Secondary | ICD-10-CM | POA: Insufficient documentation

## 2023-05-25 DIAGNOSIS — M65051 Abscess of tendon sheath, right thigh: Secondary | ICD-10-CM | POA: Diagnosis not present

## 2023-05-25 DIAGNOSIS — Z7689 Persons encountering health services in other specified circumstances: Secondary | ICD-10-CM | POA: Diagnosis not present

## 2023-05-28 DIAGNOSIS — L03115 Cellulitis of right lower limb: Secondary | ICD-10-CM | POA: Diagnosis not present

## 2023-05-28 DIAGNOSIS — S71101A Unspecified open wound, right thigh, initial encounter: Secondary | ICD-10-CM | POA: Diagnosis not present

## 2023-05-28 DIAGNOSIS — E1165 Type 2 diabetes mellitus with hyperglycemia: Secondary | ICD-10-CM | POA: Diagnosis not present

## 2023-05-28 DIAGNOSIS — M7989 Other specified soft tissue disorders: Secondary | ICD-10-CM | POA: Diagnosis not present

## 2023-05-28 DIAGNOSIS — Z419 Encounter for procedure for purposes other than remedying health state, unspecified: Secondary | ICD-10-CM | POA: Diagnosis not present

## 2023-06-08 ENCOUNTER — Encounter (HOSPITAL_BASED_OUTPATIENT_CLINIC_OR_DEPARTMENT_OTHER): Admitting: Internal Medicine

## 2023-06-08 DIAGNOSIS — S71101A Unspecified open wound, right thigh, initial encounter: Secondary | ICD-10-CM | POA: Diagnosis not present

## 2023-06-08 DIAGNOSIS — E1165 Type 2 diabetes mellitus with hyperglycemia: Secondary | ICD-10-CM | POA: Diagnosis not present

## 2023-06-08 DIAGNOSIS — M726 Necrotizing fasciitis: Secondary | ICD-10-CM | POA: Diagnosis not present

## 2023-06-08 DIAGNOSIS — M65051 Abscess of tendon sheath, right thigh: Secondary | ICD-10-CM | POA: Diagnosis not present

## 2023-06-08 DIAGNOSIS — L97812 Non-pressure chronic ulcer of other part of right lower leg with fat layer exposed: Secondary | ICD-10-CM | POA: Diagnosis not present

## 2023-06-08 DIAGNOSIS — Z7689 Persons encountering health services in other specified circumstances: Secondary | ICD-10-CM | POA: Diagnosis not present

## 2023-06-14 ENCOUNTER — Encounter: Admitting: Surgery

## 2023-06-21 ENCOUNTER — Institutional Professional Consult (permissible substitution): Admitting: Plastic Surgery

## 2023-06-22 ENCOUNTER — Ambulatory Visit (HOSPITAL_BASED_OUTPATIENT_CLINIC_OR_DEPARTMENT_OTHER): Admitting: Internal Medicine

## 2023-06-26 ENCOUNTER — Encounter: Admitting: Surgery

## 2023-06-28 DIAGNOSIS — Z419 Encounter for procedure for purposes other than remedying health state, unspecified: Secondary | ICD-10-CM | POA: Diagnosis not present

## 2023-07-28 DIAGNOSIS — Z419 Encounter for procedure for purposes other than remedying health state, unspecified: Secondary | ICD-10-CM | POA: Diagnosis not present

## 2023-08-13 ENCOUNTER — Ambulatory Visit: Admitting: Podiatry

## 2023-08-28 DIAGNOSIS — Z419 Encounter for procedure for purposes other than remedying health state, unspecified: Secondary | ICD-10-CM | POA: Diagnosis not present

## 2023-09-04 ENCOUNTER — Emergency Department (HOSPITAL_COMMUNITY)

## 2023-09-04 ENCOUNTER — Other Ambulatory Visit: Payer: Self-pay

## 2023-09-04 ENCOUNTER — Emergency Department (HOSPITAL_COMMUNITY)
Admission: EM | Admit: 2023-09-04 | Discharge: 2023-09-04 | Disposition: A | Discharge status: Home / Self Care | Attending: Emergency Medicine | Admitting: Emergency Medicine

## 2023-09-04 ENCOUNTER — Encounter (HOSPITAL_COMMUNITY): Payer: Self-pay | Admitting: Emergency Medicine

## 2023-09-04 DIAGNOSIS — N764 Abscess of vulva: Secondary | ICD-10-CM | POA: Insufficient documentation

## 2023-09-04 DIAGNOSIS — L739 Follicular disorder, unspecified: Secondary | ICD-10-CM | POA: Diagnosis not present

## 2023-09-04 DIAGNOSIS — L02215 Cutaneous abscess of perineum: Secondary | ICD-10-CM | POA: Diagnosis not present

## 2023-09-04 DIAGNOSIS — Z79899 Other long term (current) drug therapy: Secondary | ICD-10-CM | POA: Insufficient documentation

## 2023-09-04 DIAGNOSIS — N739 Female pelvic inflammatory disease, unspecified: Secondary | ICD-10-CM

## 2023-09-04 DIAGNOSIS — I10 Essential (primary) hypertension: Secondary | ICD-10-CM | POA: Diagnosis not present

## 2023-09-04 DIAGNOSIS — Z4801 Encounter for change or removal of surgical wound dressing: Secondary | ICD-10-CM | POA: Diagnosis not present

## 2023-09-04 DIAGNOSIS — Z794 Long term (current) use of insulin: Secondary | ICD-10-CM | POA: Insufficient documentation

## 2023-09-04 DIAGNOSIS — N760 Acute vaginitis: Secondary | ICD-10-CM | POA: Diagnosis not present

## 2023-09-04 DIAGNOSIS — E1165 Type 2 diabetes mellitus with hyperglycemia: Secondary | ICD-10-CM | POA: Diagnosis not present

## 2023-09-04 DIAGNOSIS — M160 Bilateral primary osteoarthritis of hip: Secondary | ICD-10-CM | POA: Diagnosis not present

## 2023-09-04 DIAGNOSIS — L662 Folliculitis decalvans: Secondary | ICD-10-CM | POA: Insufficient documentation

## 2023-09-04 LAB — CBC WITH DIFFERENTIAL/PLATELET
Abs Immature Granulocytes: 0.03 K/uL (ref 0.00–0.07)
Basophils Absolute: 0 K/uL (ref 0.0–0.1)
Basophils Relative: 0 %
Eosinophils Absolute: 0.1 K/uL (ref 0.0–0.5)
Eosinophils Relative: 1 %
HCT: 40.4 % (ref 36.0–46.0)
Hemoglobin: 13.5 g/dL (ref 12.0–15.0)
Immature Granulocytes: 1 %
Lymphocytes Relative: 24 %
Lymphs Abs: 1.4 K/uL (ref 0.7–4.0)
MCH: 32.8 pg (ref 26.0–34.0)
MCHC: 33.4 g/dL (ref 30.0–36.0)
MCV: 98.3 fL (ref 80.0–100.0)
Monocytes Absolute: 0.2 K/uL (ref 0.1–1.0)
Monocytes Relative: 4 %
Neutro Abs: 4.1 K/uL (ref 1.7–7.7)
Neutrophils Relative %: 70 %
Platelets: 391 K/uL (ref 150–400)
RBC: 4.11 MIL/uL (ref 3.87–5.11)
RDW: 14.4 % (ref 11.5–15.5)
WBC: 5.9 K/uL (ref 4.0–10.5)
nRBC: 0 % (ref 0.0–0.2)

## 2023-09-04 LAB — URINALYSIS, ROUTINE W REFLEX MICROSCOPIC
Bilirubin Urine: NEGATIVE
Glucose, UA: >=500 mg/dL — AB
Hgb urine dipstick: NEGATIVE
Ketones, ur: NEGATIVE mg/dL
Nitrite: NEGATIVE
Protein, ur: >=300 mg/dL — AB
Specific Gravity, Urine: 1.008 (ref 1.005–1.030)
pH: 7 (ref 5.0–8.0)

## 2023-09-04 LAB — COMPREHENSIVE METABOLIC PANEL WITH GFR
ALT: 16 U/L (ref 0–44)
AST: 20 U/L (ref 15–41)
Albumin: 2.8 g/dL — ABNORMAL LOW (ref 3.5–5.0)
Alkaline Phosphatase: 163 U/L — ABNORMAL HIGH (ref 38–126)
Anion gap: 9 (ref 5–15)
BUN: 24 mg/dL — ABNORMAL HIGH (ref 6–20)
CO2: 25 mmol/L (ref 22–32)
Calcium: 8.4 mg/dL — ABNORMAL LOW (ref 8.9–10.3)
Chloride: 102 mmol/L (ref 98–111)
Creatinine, Ser: 2.02 mg/dL — ABNORMAL HIGH (ref 0.44–1.00)
GFR, Estimated: 30 mL/min — ABNORMAL LOW (ref >60)
Glucose, Bld: 268 mg/dL — ABNORMAL HIGH (ref 70–99)
Potassium: 3.2 mmol/L — ABNORMAL LOW (ref 3.5–5.1)
Sodium: 136 mmol/L (ref 135–145)
Total Bilirubin: 0.7 mg/dL (ref 0.0–1.2)
Total Protein: 7.2 g/dL (ref 6.5–8.1)

## 2023-09-04 LAB — LACTIC ACID, PLASMA
Lactic Acid, Venous: 1.1 mmol/L (ref 0.5–1.9)
Lactic Acid, Venous: 1.2 mmol/L (ref 0.5–1.9)

## 2023-09-04 MED ORDER — IOHEXOL 300 MG/ML  SOLN
75.0000 mL | Freq: Once | INTRAMUSCULAR | Status: AC | PRN
  Administered 2023-09-04: 75 mL via INTRAVENOUS

## 2023-09-04 MED ORDER — LIDOCAINE-EPINEPHRINE (PF) 2 %-1:200000 IJ SOLN
10.0000 mL | Freq: Once | INTRAMUSCULAR | Status: AC
  Administered 2023-09-04: 10 mL
  Filled 2023-09-04: qty 20

## 2023-09-04 MED ORDER — SODIUM CHLORIDE 0.9 % IV BOLUS
500.0000 mL | Freq: Once | INTRAVENOUS | Status: AC
  Administered 2023-09-04: 500 mL via INTRAVENOUS

## 2023-09-04 MED ORDER — AMLODIPINE BESYLATE 5 MG PO TABS
5.0000 mg | ORAL_TABLET | Freq: Once | ORAL | Status: AC
  Administered 2023-09-04: 5 mg via ORAL
  Filled 2023-09-04: qty 1

## 2023-09-04 MED ORDER — CEPHALEXIN 250 MG PO CAPS: 250.0000 mg | ORAL_CAPSULE | Freq: Three times a day (TID) | ORAL | 0 refills | Status: AC

## 2023-09-04 MED ORDER — SULFAMETHOXAZOLE-TRIMETHOPRIM 400-80 MG PO TABS: 1.0000 | ORAL_TABLET | Freq: Two times a day (BID) | ORAL | 0 refills | Status: AC

## 2023-09-04 NOTE — Discharge Instructions (Addendum)
 It was a pleasure taking care of you today.  Based on your history, physical exam, labs, and imaging I feel you are safe for discharge.  Today the abscess in your groin was noted to be a superficial abscess.  It was drained at bedside and left open to continue draining.  You have been prescribed 2 antibiotics, please take as instructed and complete the entire course.  Please continue to monitor the area.  If it seems like the infection is worsening with redness surrounding the area, worsening drainage, severe pain, or swelling please return the emergency department.  Please also return if you experience fever, chills, weakness, fatigue, or other concerning symptom.  Due to your past history I do think it is a good idea to have this area rechecked by your primary care provider in the next 48 to 72 hours to ensure antibiotics and that the drainage worked.  Please schedule a follow-up with your primary care provider within the next 48 to 72 hours for wound re-check. Please keep the draining area clean and dry and wash with soap and water. Please do not soak the area. Please keep a loose covering on the area.

## 2023-09-04 NOTE — ED Triage Notes (Signed)
 Pt to the ED with complaints of a wound check in her vaginal area.

## 2023-09-04 NOTE — ED Provider Notes (Incomplete)
 [place] EMERGENCY DEPARTMENT AT [facility] Provider Note   CSN: [id number] Arrival date & time: 09/04/23  1217     Patient presents with: Wound Check   Megan Mullins is a 53 y.o. female who presents to the emergency department with a chief complaint of vaginal wound.  Patient states that she has a boil like a mass on the front of her groin area that just popped up overnight.  Denies fever or chills.  She does however state that the area is very painful and tender to touch.  Patient states that approximately 4 months ago she had a surgery completed for a abscess like this in her genital area which turned out to be a necrotizing soft tissue infection that was down to the muscle.  Patient states that she has been compliant with packing and wound care for this and that it is healing as it should.  Patient has a past medical history significant for type 2 diabetes, obesity, tobacco abuse, substance abuse, AKI, sepsis, hypertension, cellulitis, necrotizing soft tissue infection, hyperosmolar hyperglycemic state, etc.  {Add pertinent medical, surgical, social history, OB history to YEP:67052}  Wound Check       Prior to Admission medications   Medication Sig Start Date End Date Taking? Authorizing Provider  amLODipine  (NORVASC ) 5 MG tablet Take 1 tablet (5 mg total) by mouth daily. 04/27/23   Raenelle Coria, MD  atorvastatin  (LIPITOR) 20 MG tablet Take 20 mg by mouth daily. 07/19/22   [provider]  insulin  NPH-regular Human (NOVOLIN  70/30 RELION) (70-30) 100 UNIT/ML injection Inject 25 Units into the skin 2 (two) times daily with a meal. 04/27/23   Raenelle Coria, MD  Insulin  Syringe-Needle U-100 25G X 5/8 1 ML MISC 1 each by Does not apply route 3 (three) times daily. May dispense any manufacturer covered by patient's insurance. 04/27/23   Raenelle Coria, MD  Lancets MISC 1 each by Does not apply route 3 (three) times daily. Use as directed to check blood sugar. May  dispense any manufacturer covered by patient's insurance and fits patient's device. 04/27/23   Raenelle Coria, MD    Allergies: Patient has no known allergies.    Review of Systems  Genitourinary:  Positive for vaginal pain.    Updated Vital Signs BP (!) 183/130 (BP Location: Right Arm)   Pulse (!) 114   Temp 97.8 F (36.6 C) (Oral)   Resp 18   Ht 4' 9 (1.448 m)   Wt 79.4 kg   LMP 07/18/2021 (Approximate)   SpO2 100%   BMI 37.87 kg/m   Physical Exam Vitals and nursing note reviewed.  Constitutional:      General: She is not in acute distress.    Appearance: Normal appearance. She is not ill-appearing, toxic-appearing or diaphoretic.  HENT:     Head: Normocephalic and atraumatic.  Eyes:     General: No scleral icterus. Pulmonary:     Effort: Pulmonary effort is normal. No respiratory distress.  Neurological:     Mental Status: She is alert.     (all labs ordered are listed, but only abnormal results are displayed) Labs Reviewed - No data to display  EKG: None  Radiology: No results found.  {Document cardiac monitor, telemetry assessment procedure when appropriate:32947} Procedures   Medications Ordered in the ED - No data to display  Clinical Course as of 09/04/23 1530  Tue Sep 04, 2023  1456 Hx of necrotizing soft tissue infection to groin, full lab work-up  and CT pelvis imaging to assess [CH]    Clinical Course User Index [CH] Azure Budnick, [place] FALCON, PA-C   {Click here for ABCD2, HEART and other calculators REFRESH Note before signing:1}                              Medical Decision Making  ***  {Document critical care time when appropriate  Document review of labs and clinical decision tools ie [id number], etc  Document your independent review of radiology images and any outside records  Document your discussion with family members, caretakers and with consultants  Document social determinants of health affecting pt's care  Document your decision  making why or why not admission, treatments were needed:32947:::1}   Final diagnoses:  None    ED Discharge Orders     None

## 2023-09-04 NOTE — ED Notes (Signed)
 Patient remains hypertensive, notified PA

## 2023-09-09 LAB — CULTURE, BLOOD (ROUTINE X 2)
Culture: NO GROWTH
Culture: NO GROWTH
Special Requests: ADEQUATE

## 2023-09-28 DIAGNOSIS — Z419 Encounter for procedure for purposes other than remedying health state, unspecified: Secondary | ICD-10-CM | POA: Diagnosis not present

## 2023-10-11 ENCOUNTER — Ambulatory Visit: Admitting: Family Medicine

## 2023-11-26 ENCOUNTER — Encounter (HOSPITAL_COMMUNITY): Payer: Self-pay

## 2023-11-26 ENCOUNTER — Other Ambulatory Visit: Payer: Self-pay

## 2023-11-26 ENCOUNTER — Observation Stay (HOSPITAL_COMMUNITY)

## 2023-11-26 ENCOUNTER — Observation Stay (HOSPITAL_COMMUNITY)
Admission: EM | Admit: 2023-11-26 | Discharge: 2023-11-27 | Disposition: A | Attending: Emergency Medicine | Admitting: Emergency Medicine

## 2023-11-26 DIAGNOSIS — I10 Essential (primary) hypertension: Secondary | ICD-10-CM | POA: Diagnosis not present

## 2023-11-26 DIAGNOSIS — E1165 Type 2 diabetes mellitus with hyperglycemia: Secondary | ICD-10-CM | POA: Diagnosis not present

## 2023-11-26 DIAGNOSIS — E785 Hyperlipidemia, unspecified: Secondary | ICD-10-CM | POA: Insufficient documentation

## 2023-11-26 DIAGNOSIS — Z79899 Other long term (current) drug therapy: Secondary | ICD-10-CM | POA: Diagnosis not present

## 2023-11-26 DIAGNOSIS — I129 Hypertensive chronic kidney disease with stage 1 through stage 4 chronic kidney disease, or unspecified chronic kidney disease: Secondary | ICD-10-CM | POA: Insufficient documentation

## 2023-11-26 DIAGNOSIS — Z794 Long term (current) use of insulin: Secondary | ICD-10-CM | POA: Diagnosis not present

## 2023-11-26 DIAGNOSIS — N1832 Chronic kidney disease, stage 3b: Secondary | ICD-10-CM | POA: Diagnosis present

## 2023-11-26 DIAGNOSIS — E871 Hypo-osmolality and hyponatremia: Secondary | ICD-10-CM | POA: Insufficient documentation

## 2023-11-26 DIAGNOSIS — F17210 Nicotine dependence, cigarettes, uncomplicated: Secondary | ICD-10-CM | POA: Insufficient documentation

## 2023-11-26 DIAGNOSIS — E66811 Obesity, class 1: Secondary | ICD-10-CM | POA: Insufficient documentation

## 2023-11-26 DIAGNOSIS — R7989 Other specified abnormal findings of blood chemistry: Secondary | ICD-10-CM | POA: Diagnosis present

## 2023-11-26 DIAGNOSIS — L02214 Cutaneous abscess of groin: Secondary | ICD-10-CM | POA: Diagnosis present

## 2023-11-26 DIAGNOSIS — L0291 Cutaneous abscess, unspecified: Principal | ICD-10-CM

## 2023-11-26 LAB — CBC WITH DIFFERENTIAL/PLATELET
Abs Immature Granulocytes: 0.02 K/uL (ref 0.00–0.07)
Basophils Absolute: 0 K/uL (ref 0.0–0.1)
Basophils Relative: 0 %
Eosinophils Absolute: 0 K/uL (ref 0.0–0.5)
Eosinophils Relative: 0 %
HCT: 47.4 % — ABNORMAL HIGH (ref 36.0–46.0)
Hemoglobin: 15.6 g/dL — ABNORMAL HIGH (ref 12.0–15.0)
Immature Granulocytes: 0 %
Lymphocytes Relative: 18 %
Lymphs Abs: 1.2 K/uL (ref 0.7–4.0)
MCH: 30.9 pg (ref 26.0–34.0)
MCHC: 32.9 g/dL (ref 30.0–36.0)
MCV: 93.9 fL (ref 80.0–100.0)
Monocytes Absolute: 0.3 K/uL (ref 0.1–1.0)
Monocytes Relative: 4 %
Neutro Abs: 5.3 K/uL (ref 1.7–7.7)
Neutrophils Relative %: 78 %
Platelets: 337 K/uL (ref 150–400)
RBC: 5.05 MIL/uL (ref 3.87–5.11)
RDW: 13.1 % (ref 11.5–15.5)
WBC: 6.9 K/uL (ref 4.0–10.5)
nRBC: 0 % (ref 0.0–0.2)

## 2023-11-26 LAB — URINALYSIS, ROUTINE W REFLEX MICROSCOPIC
Bacteria, UA: NONE SEEN
Bilirubin Urine: NEGATIVE
Glucose, UA: >=500 mg/dL — AB
Ketones, ur: NEGATIVE mg/dL
Leukocytes,Ua: NEGATIVE
Nitrite: NEGATIVE
Protein, ur: 100 mg/dL — AB
Specific Gravity, Urine: 1.013 (ref 1.005–1.030)
pH: 7 (ref 5.0–8.0)

## 2023-11-26 LAB — GLUCOSE, CAPILLARY
Glucose-Capillary: 107 mg/dL — ABNORMAL HIGH (ref 70–99)
Glucose-Capillary: 236 mg/dL — ABNORMAL HIGH (ref 70–99)
Glucose-Capillary: 380 mg/dL — ABNORMAL HIGH (ref 70–99)
Glucose-Capillary: 75 mg/dL (ref 70–99)

## 2023-11-26 LAB — BASIC METABOLIC PANEL WITH GFR
Anion gap: 11 (ref 5–15)
BUN: 16 mg/dL (ref 6–20)
CO2: 26 mmol/L (ref 22–32)
Calcium: 9.6 mg/dL (ref 8.9–10.3)
Chloride: 89 mmol/L — ABNORMAL LOW (ref 98–111)
Creatinine, Ser: 1.81 mg/dL — ABNORMAL HIGH (ref 0.44–1.00)
GFR, Estimated: 33 mL/min — ABNORMAL LOW (ref >60)
Glucose, Bld: 680 mg/dL (ref 70–99)
Potassium: 5.2 mmol/L — ABNORMAL HIGH (ref 3.5–5.1)
Sodium: 127 mmol/L — ABNORMAL LOW (ref 135–145)

## 2023-11-26 LAB — CBG MONITORING, ED: Glucose-Capillary: 500 mg/dL — ABNORMAL HIGH (ref 70–99)

## 2023-11-26 MED ORDER — DOXYCYCLINE HYCLATE 100 MG PO TABS
100.0000 mg | ORAL_TABLET | Freq: Once | ORAL | Status: AC
  Administered 2023-11-26: 100 mg via ORAL
  Filled 2023-11-26: qty 1

## 2023-11-26 MED ORDER — LACTATED RINGERS IV SOLN: INTRAVENOUS | Status: DC

## 2023-11-26 MED ORDER — LIDOCAINE HCL (PF) 1 % IJ SOLN
30.0000 mL | Freq: Once | INTRAMUSCULAR | Status: AC
  Administered 2023-11-26: 30 mL
  Filled 2023-11-26: qty 30

## 2023-11-26 MED ORDER — DEXTROSE 50 % IV SOLN: 0.0000 mL | INTRAVENOUS | Status: DC | PRN

## 2023-11-26 MED ORDER — METOPROLOL TARTRATE 5 MG/5ML IV SOLN
2.5000 mg | INTRAVENOUS | Status: AC
  Administered 2023-11-27: 2.5 mg via INTRAVENOUS
  Filled 2023-11-26: qty 5

## 2023-11-26 MED ORDER — SODIUM CHLORIDE 0.9 % IV BOLUS
1000.0000 mL | Freq: Once | INTRAVENOUS | Status: AC
  Administered 2023-11-26: 1000 mL via INTRAVENOUS

## 2023-11-26 MED ORDER — LOSARTAN POTASSIUM 50 MG PO TABS: 50.0000 mg | ORAL_TABLET | Freq: Every day | ORAL | Status: DC

## 2023-11-26 MED ORDER — ENOXAPARIN SODIUM 40 MG/0.4ML IJ SOSY
40.0000 mg | PREFILLED_SYRINGE | Freq: Every day | INTRAMUSCULAR | Status: DC
  Administered 2023-11-26: 40 mg via SUBCUTANEOUS
  Filled 2023-11-26: qty 0.4

## 2023-11-26 MED ORDER — CHLORHEXIDINE GLUCONATE CLOTH 2 % EX PADS
6.0000 | MEDICATED_PAD | Freq: Every day | CUTANEOUS | Status: DC
  Administered 2023-11-26 – 2023-11-27 (×2): 6 via TOPICAL

## 2023-11-26 MED ORDER — DOXYCYCLINE HYCLATE 100 MG IV SOLR: 100.0000 mg | Freq: Two times a day (BID) | INTRAVENOUS | Status: DC

## 2023-11-26 MED ORDER — SODIUM CHLORIDE 0.9 % IV SOLN
2.0000 g | INTRAVENOUS | Status: DC
  Administered 2023-11-26: 2 g via INTRAVENOUS
  Filled 2023-11-26: qty 20

## 2023-11-26 MED ORDER — DOXYCYCLINE HYCLATE 100 MG PO CAPS: 100.0000 mg | ORAL_CAPSULE | Freq: Two times a day (BID) | ORAL | 0 refills | Status: DC

## 2023-11-26 MED ORDER — DEXTROSE IN LACTATED RINGERS 5 % IV SOLN: INTRAVENOUS | Status: DC

## 2023-11-26 MED ORDER — SODIUM CHLORIDE 0.9 % IV SOLN
100.0000 mg | Freq: Two times a day (BID) | INTRAVENOUS | Status: DC
  Administered 2023-11-27: 100 mg via INTRAVENOUS
  Filled 2023-11-26 (×3): qty 100

## 2023-11-26 MED ORDER — LACTATED RINGERS IV BOLUS
1000.0000 mL | Freq: Once | INTRAVENOUS | Status: AC
  Administered 2023-11-26: 1000 mL via INTRAVENOUS

## 2023-11-26 MED ORDER — INSULIN REGULAR(HUMAN) IN NACL 100-0.9 UT/100ML-% IV SOLN
INTRAVENOUS | Status: DC
  Administered 2023-11-26: 12 [IU]/h via INTRAVENOUS
  Filled 2023-11-26: qty 100

## 2023-11-26 MED ORDER — LOSARTAN POTASSIUM 50 MG PO TABS
50.0000 mg | ORAL_TABLET | Freq: Every day | ORAL | Status: DC
  Administered 2023-11-27 (×2): 50 mg via ORAL
  Filled 2023-11-26 (×2): qty 1

## 2023-11-26 MED ORDER — HYDROCODONE-ACETAMINOPHEN 5-325 MG PO TABS
1.0000 | ORAL_TABLET | Freq: Once | ORAL | Status: AC
  Administered 2023-11-26: 1 via ORAL
  Filled 2023-11-26: qty 1

## 2023-11-26 MED ORDER — AMLODIPINE BESYLATE 5 MG PO TABS
5.0000 mg | ORAL_TABLET | Freq: Every day | ORAL | Status: DC
  Administered 2023-11-27: 5 mg via ORAL
  Filled 2023-11-26: qty 1

## 2023-11-26 NOTE — H&P (Signed)
 History and Physical    Megan Mullins FMW:[id number] DOB: [birth year] DOA: 11/26/2023  PCP: Patient, No Pcp Per   Patient coming from: Home  I have personally briefly reviewed patient's old medical records in [facility]  Chief Complaint: Abscess to left groin  HPI: Megan Mullins is a 53 y.o. female with medical history significant for hypertension, diabetes mellitus, multifocal atrial tachycardia, necrotizing infection involving the right groin. Patient presented to the ED with complaints of swelling and pain to the left groin that she noticed yesterday.  No fevers no chills, no genitourinary or GI symptoms.  She denies increased thirst.  She reports compliance with her Lantus  30 units at night daily.  But she does not check her sugars.  ED Course: Temperature 98.2.  Heart rate 88- 106.  Respirate rate 14-18.  Blood pressure 138-174.  O2 sats greater 98% on room air. Blood glucose 280.  Anion gap of 11, serum bicarb of 26. Incision and drainage done by ED provider with drainage about 5 cc of purulent fluid.  1 L bolus given.  Insulin  drip started.  Review of Systems: As per HPI all other systems reviewed and negative.  Past Medical History:  Diagnosis Date   Diabetes mellitus without complication (HCC)     Past Surgical History:  Procedure Laterality Date   IRRIGATION AND DEBRIDEMENT OF NECROTIZING SOFT TISSUE INFECTION Right 04/23/2023   Procedure: IRRIGATION, DRAINAGE AND DEBRIDEMENT OF NECROTIZING SOFT TISSUE INFECTION RIGHT GROIN DOWN TO MUSCLE, 11CM X 12CM X 5CM;  Surgeon: Evonnie Dorothyann LABOR, DO;  Location: [facility];  Service: General;  Laterality: Right;   TUBAL LIGATION       reports that she has been smoking cigarettes. She has never used smokeless tobacco. She reports that she does not currently use alcohol. She reports that she does not currently use drugs after having used the following drugs: Cocaine.  No Known Allergies  Family History  Problem Relation Age  of Onset   Heart disease Mother    Diabetes Mother     Prior to Admission medications   Medication Sig Start Date End Date Taking? Authorizing Provider  amLODipine  (NORVASC ) 5 MG tablet Take 1 tablet (5 mg total) by mouth daily. 04/27/23  Yes Ghimire, Renato, MD  atorvastatin  (LIPITOR) 20 MG tablet Take 20 mg by mouth daily. 07/19/22  Yes [provider]  doxycycline (VIBRAMYCIN) 100 MG capsule Take 1 capsule (100 mg total) by mouth 2 (two) times daily for 7 days. 11/26/23 12/03/23 Yes Beatty, Celeste A, PA-C  LANTUS  100 UNIT/ML injection Inject 30 Units into the skin at bedtime. 11/02/23  Yes [provider]  losartan -hydrochlorothiazide (HYZAAR) 50-12.5 MG tablet Take 1 tablet by mouth daily. 11/02/23  Yes [provider]  Insulin  Syringe-Needle U-100 25G X 5/8 1 ML MISC 1 each by Does not apply route 3 (three) times daily. May dispense any manufacturer covered by patient's insurance. 04/27/23   Raenelle Renato, MD  Lancets MISC 1 each by Does not apply route 3 (three) times daily. Use as directed to check blood sugar. May dispense any manufacturer covered by patient's insurance and fits patient's device. 04/27/23   Raenelle Renato, MD    Physical Exam: Vitals:   11/26/23 1432 11/26/23 1434 11/26/23 1538  BP:  (!) 178/122 (!) 138/100  Pulse:  (!) 106 90  Resp:  14 16  Temp:  98.2 F (36.8 C)   TempSrc:  Oral   SpO2:  98% 98%  Weight: 79.4  kg    Height: 5' 1 (1.549 m)      Constitutional: NAD, calm, comfortable Vitals:   11/26/23 1432 11/26/23 1434 11/26/23 1538  BP:  (!) 178/122 (!) 138/100  Pulse:  (!) 106 90  Resp:  14 16  Temp:  98.2 F (36.8 C)   TempSrc:  Oral   SpO2:  98% 98%  Weight: 79.4 kg    Height: 5' 1 (1.549 m)     Eyes: PERRL, lids and conjunctivae normal ENMT: Mucous membranes are moist. Neck: normal, supple, no masses, no thyromegaly Respiratory: clear to auscultation bilaterally, no wheezing, no crackles.  Cardiovascular: Regular  rate and rhythm, no murmurs / rubs / gallops. No extremity edema.  Abdomen: Mild to moderate left groin tenderness, slightly bloody I&D packing still in place, abdomen soft, not distended, no masses palpated. No hepatosplenomegaly.  Musculoskeletal: no clubbing / cyanosis. No joint deformity upper and lower extremities. Skin: no rashes, lesions, ulcers. No induration Neurologic: No facial asymmetry, moving extremity spontaneously, speech fluent Psychiatric: Normal judgment and insight. Alert and oriented x 3. Normal mood.   Labs on Admission: I have personally reviewed following labs and imaging studies  CBC: Recent Labs  Lab 11/26/23 1617  WBC 6.9  NEUTROABS 5.3  HGB 15.6*  HCT 47.4*  MCV 93.9  PLT 337   Basic Metabolic Panel: Recent Labs  Lab 11/26/23 1617  NA 127*  K 5.2*  CL 89*  CO2 26  GLUCOSE 680*  BUN 16  CREATININE 1.81*  CALCIUM  9.6   Urine analysis:    Component Value Date/Time   COLORURINE YELLOW 09/04/2023 1500   APPEARANCEUR HAZY (A) 09/04/2023 1500   LABSPEC 1.008 09/04/2023 1500   PHURINE 7.0 09/04/2023 1500   GLUCOSEU >=500 (A) 09/04/2023 1500   HGBUR NEGATIVE 09/04/2023 1500   BILIRUBINUR NEGATIVE 09/04/2023 1500   KETONESUR NEGATIVE 09/04/2023 1500   PROTEINUR >=300 (A) 09/04/2023 1500   UROBILINOGEN 0.2 03/13/2014 1940   NITRITE NEGATIVE 09/04/2023 1500   LEUKOCYTESUR TRACE (A) 09/04/2023 1500    Radiological Exams on Admission: No results found.  EKG: Pending.   Assessment/Plan Principal Problem:   Type 2 diabetes mellitus with hyperglycemia (HCC) Active Problems:   Abscess of left groin   Essential hypertension   Pseudohyponatremia   CKD stage 3b, GFR 30-44 ml/min (HCC)  Assessment and Plan:  Hyperglycemia- blood sugar 680, with normal anion gap of 11 and serum bicarb of 26.  Not in DKA.  Reports compliance with Lantus  30 units nightly.  Will has not been checking her blood sugars.  Hyperglycemia in the setting of left groin  abscess. - Continue insulin  drip  - 2 L bolus given continue LR at 100 cc/h - DC insulin  drip when CBG less than 250, and resume home Lantus  with SSI-M - Monitor electrolytes  Left groin abscess-Tmax 98.2, WBC 6.9.  Rules out for sepsis.  I&D done in ED, about 5 cc fluid expressed.  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 - Obtain CT pelvis wo contrast - IV ceftriaxone  and doxycycline  Pseudohyponatremia-sodium 127, corrected sodium 141.  CKD 3B-stable.  Hypertension systolic 130s to 829d -Resume Norvasc  5 mg, losartan  50 mg daily, holding HCTZ 12.5 while hydrating   DVT prophylaxis: Lovenox  Code Status: Full code Family Communication: None at bedside Disposition Plan: ~ 2 days Consults called: None, may need general surgery pending findings on CT pelvis.  Admission status: Obs, stepdown   Author: Tully FORBES Carwin, MD 11/26/2023 8:50 PM  For on call review [email or web address].

## 2023-11-26 NOTE — ED Triage Notes (Signed)
 Pt has abscess to left leg/ groin area. Pt reports popping up yesterday. Pt denies fever at home.

## 2023-11-26 NOTE — Discharge Instructions (Addendum)
 Noted in the Emergency Department today for a skin abscess.  This was incised and drained, we are starting you on antibiotics.  You should have this rechecked in about 3 days to ensure it is resolving and have the packing removed.  Keep the area clean and dry and keep it covered with dry gauze.  Come back if you have fever, increased pain or swelling or any new or worsening symptoms.    Providers Accepting New Patients in [place], [place]    [facility] [address]  [place], [place] [address]J [phone] Accepts most insurances  [facility] [address] [place], [place] [address] [phone] Accepts most insurances  [facility] [address] [place], [place] [address]  [phone] Must meet requirements  [facility] [address] [place], [place] [address] [phone] Accepts most insurances  [facility] [address]  [place], [place] [address] [phone] Accepts most insurances  [facility] [address]   [place], [place]   [phone] Accepts most insurances  [facility])  [phone] [address]  [place], [place] [address] [phone] Accepts most insurances  [facility] [address]  [place], [place] [address] [phone] Accepts most insurances  [facility] Department [address] [place], [place] [address] [phone] option 1 Accepts Medicaid and [facility] Internal Medicine [address]  [place], [place] [address] [phone] Accepts most insurances  Benita Outhouse, MD [address] [place], [place] [address] [phone] Accepts most insurances  [facility] at [facility] [address]  [place], [place] [address] [phone] Accepts most insurances  [facility] [phone] [address] [place], [place] [address] [phone] Accepts  most insurances  [place] [place] [address], [address] [place], [place] [address] [phone]  Accepts most insurances

## 2023-11-26 NOTE — ED Provider Notes (Signed)
 [place] [facility] DE[facility] AT [facility] Provider Note   CSN: [id number] Arrival date & time: 11/26/23  1423     Patient presents with: Abscess   Megan Mullins is a 53 y.o. female.  ER for left pubic area abscess that started yesterday, denies fever or chills, this is very painful, denies numbness tingling or weakness, states she has had this in the past.  In April of this year she had a necrotizing infection to the right groin and had to be managed surgically.    Abscess      Prior to Admission medications   Medication Sig Start Date End Date Taking? Authorizing Provider  losartan -hydrochlorothiazide (HYZAAR) 50-12.5 MG tablet Take 1 tablet by mouth daily. 11/02/23  Yes [provider]  amLODipine  (NORVASC ) 5 MG tablet Take 1 tablet (5 mg total) by mouth daily. 04/27/23   Raenelle Coria, MD  atorvastatin  (LIPITOR) 20 MG tablet Take 20 mg by mouth daily. 07/19/22   [provider]  insulin  NPH-regular Human (NOVOLIN  70/30 RELION) (70-30) 100 UNIT/ML injection Inject 25 Units into the skin 2 (two) times daily with a meal. 04/27/23   Raenelle Coria, MD  Insulin  Syringe-Needle U-100 25G X 5/8 1 ML MISC 1 each by Does not apply route 3 (three) times daily. May dispense any manufacturer covered by patient's insurance. 04/27/23   Raenelle Coria, MD  Lancets MISC 1 each by Does not apply route 3 (three) times daily. Use as directed to check blood sugar. May dispense any manufacturer covered by patient's insurance and fits patient's device. 04/27/23   Raenelle Coria, MD    Allergies: Patient has no known allergies.    Review of Systems  Updated Vital Signs BP (!) 178/122 (BP Location: Right Arm) Comment: pt reports takes BP medication and took them today  Pulse (!) 106   Temp 98.2 F (36.8 C) (Oral)   Resp 14   Ht 5' 1 (1.549 m)   Wt 79.4 kg   LMP 07/18/2021 (Approximate)   SpO2 98%   BMI 33.07 kg/m   Physical Exam Vitals and nursing note  reviewed.  Constitutional:      General: She is not in acute distress.    Appearance: She is well-developed.  HENT:     Head: Normocephalic and atraumatic.  Eyes:     Conjunctiva/sclera: Conjunctivae normal.  Cardiovascular:     Rate and Rhythm: Normal rate and regular rhythm.     Heart sounds: No murmur heard. Pulmonary:     Effort: Pulmonary effort is normal. No respiratory distress.     Breath sounds: Normal breath sounds.  Abdominal:     Palpations: Abdomen is soft.     Tenderness: There is no abdominal tenderness.  Genitourinary:    Comments: Mom to bedside approximately a 3 cm diameter area of induration with no crepitus, no overlying cellulitis with mild fluctuance in the center.  There is no drainage. Musculoskeletal:        General: No swelling.     Cervical back: Neck supple.  Skin:    General: Skin is warm and dry.     Capillary Refill: Capillary refill takes less than 2 seconds.  Neurological:     General: No focal deficit present.     Mental Status: She is alert and oriented to person, place, and time.  Psychiatric:        Mood and Affect: Mood normal.     (all labs ordered are listed, but only abnormal results  are displayed) Labs Reviewed - No data to display  EKG: None  Radiology: No results found.   .Incision and Drainage  Date/Time: 11/26/2023 5:08 PM  Performed by: Suellen Sherran LABOR, PA-C Authorized by: Suellen Sherran LABOR, PA-C   Consent:    Consent obtained:  Verbal   Consent given by:  Patient   Risks discussed:  Bleeding, incomplete drainage, pain, infection and damage to other organs   Alternatives discussed: antibiotics only. Universal protocol:    Patient identity confirmed:  Verbally with patient Location:    Type:  Abscess   Location: left mons pubis. Pre-procedure details:    Skin preparation:  Povidone-iodine  Sedation:    Sedation type:  None Anesthesia:    Anesthesia method:  Local infiltration   Local anesthetic:  Lidocaine   1% w/o epi Procedure type:    Complexity:  Complex Procedure details:    Ultrasound guidance: no     Incision types:  Single straight   Incision depth:  Subcutaneous   Wound management:  Probed and deloculated and irrigated with saline   Drainage:  Purulent   Drainage amount:  Moderate   Packing materials:  1/2 in iodoform gauze Post-procedure details:    Procedure completion:  Tolerated well, no immediate complications    Medications Ordered in the ED - No data to display                                  Medical Decision Making Differential diagnosis includes but limited to abscess, cellulitis, necrotizing fasciitis, epidermal inclusion cyst, other  Course: Patient presents the ER today complaining of abscess to right pubic area.  Triage vital signs show patient was tachycardic but without intervention heart rate is now normal, blood pressure is also seemingly improved without any intervention.  She is afebrile.  Will get some basic labs in light of her necrotizing infection.  If the I&D but if patient has significant lab abnormalities but will proceed with CT.  Labs: No leukocytosis or no anemia on CBC   Patient had a reassuring exam and vitals, no leukocytosis, she reports this does not feel nearly as painful and does not seem to be as severe as when she had the necrotizing infection so at this time do not feel she needs CT and she is agreeable with this.  We discussed risk and benefits of the CT.  We proceeded with incision and drainage, wound was probed into the loculated and irrigated with normal saline.  Her CBC and BMP were reassuring while here with I&D anticipating likely discharge, she tolerated this well and is feeling better, however BMP resulted with glucose of 680, she is on insulin  1 shot at nighttime only which she has not yet taken today.  Potassium slightly high at 5.2, creatinine is at her baseline.  She is agreeable with IV fluids, insulin  and hospital  admission.  Amount and/or Complexity of Data Reviewed External Data Reviewed: labs, radiology and notes. Labs: ordered.  Risk Prescription drug management. Decision regarding hospitalization.        Final diagnoses:  None    ED Discharge Orders     None          Suellen Sherran LABOR Megan Mullins 11/26/23 2308    Charlyn Sora, MD 11/26/23 2330

## 2023-11-27 DIAGNOSIS — L03314 Cellulitis of groin: Secondary | ICD-10-CM | POA: Diagnosis not present

## 2023-11-27 DIAGNOSIS — L0291 Cutaneous abscess, unspecified: Principal | ICD-10-CM

## 2023-11-27 DIAGNOSIS — I10 Essential (primary) hypertension: Secondary | ICD-10-CM | POA: Diagnosis not present

## 2023-11-27 DIAGNOSIS — N1832 Chronic kidney disease, stage 3b: Secondary | ICD-10-CM | POA: Diagnosis not present

## 2023-11-27 DIAGNOSIS — I878 Other specified disorders of veins: Secondary | ICD-10-CM | POA: Diagnosis not present

## 2023-11-27 DIAGNOSIS — K5790 Diverticulosis of intestine, part unspecified, without perforation or abscess without bleeding: Secondary | ICD-10-CM | POA: Diagnosis not present

## 2023-11-27 DIAGNOSIS — R7989 Other specified abnormal findings of blood chemistry: Secondary | ICD-10-CM | POA: Diagnosis not present

## 2023-11-27 DIAGNOSIS — Z794 Long term (current) use of insulin: Secondary | ICD-10-CM | POA: Diagnosis not present

## 2023-11-27 DIAGNOSIS — E1165 Type 2 diabetes mellitus with hyperglycemia: Secondary | ICD-10-CM | POA: Diagnosis not present

## 2023-11-27 LAB — BASIC METABOLIC PANEL WITH GFR
Anion gap: 9 (ref 5–15)
BUN: 12 mg/dL (ref 6–20)
CO2: 26 mmol/L (ref 22–32)
Calcium: 9 mg/dL (ref 8.9–10.3)
Chloride: 102 mmol/L (ref 98–111)
Creatinine, Ser: 1.63 mg/dL — ABNORMAL HIGH (ref 0.44–1.00)
GFR, Estimated: 38 mL/min — ABNORMAL LOW (ref >60)
Glucose, Bld: 144 mg/dL — ABNORMAL HIGH (ref 70–99)
Potassium: 3.7 mmol/L (ref 3.5–5.1)
Sodium: 137 mmol/L (ref 135–145)

## 2023-11-27 LAB — GLUCOSE, CAPILLARY
Glucose-Capillary: 109 mg/dL — ABNORMAL HIGH (ref 70–99)
Glucose-Capillary: 115 mg/dL — ABNORMAL HIGH (ref 70–99)
Glucose-Capillary: 129 mg/dL — ABNORMAL HIGH (ref 70–99)
Glucose-Capillary: 137 mg/dL — ABNORMAL HIGH (ref 70–99)
Glucose-Capillary: 139 mg/dL — ABNORMAL HIGH (ref 70–99)
Glucose-Capillary: 144 mg/dL — ABNORMAL HIGH (ref 70–99)
Glucose-Capillary: 144 mg/dL — ABNORMAL HIGH (ref 70–99)
Glucose-Capillary: 150 mg/dL — ABNORMAL HIGH (ref 70–99)
Glucose-Capillary: 154 mg/dL — ABNORMAL HIGH (ref 70–99)
Glucose-Capillary: 165 mg/dL — ABNORMAL HIGH (ref 70–99)
Glucose-Capillary: 168 mg/dL — ABNORMAL HIGH (ref 70–99)
Glucose-Capillary: 173 mg/dL — ABNORMAL HIGH (ref 70–99)
Glucose-Capillary: 207 mg/dL — ABNORMAL HIGH (ref 70–99)
Glucose-Capillary: 291 mg/dL — ABNORMAL HIGH (ref 70–99)

## 2023-11-27 LAB — HIV ANTIBODY (ROUTINE TESTING W REFLEX): HIV Screen 4th Generation wRfx: NONREACTIVE

## 2023-11-27 LAB — MRSA NEXT GEN BY PCR, NASAL: MRSA by PCR Next Gen: NOT DETECTED

## 2023-11-27 MED ORDER — AMOXICILLIN-POT CLAVULANATE 875-125 MG PO TABS: 1.0000 | ORAL_TABLET | Freq: Two times a day (BID) | ORAL | 0 refills | Status: AC

## 2023-11-27 MED ORDER — DOXYCYCLINE HYCLATE 100 MG PO CAPS: 100.0000 mg | ORAL_CAPSULE | Freq: Two times a day (BID) | ORAL | 0 refills | Status: AC

## 2023-11-27 MED ORDER — HYDROCODONE-ACETAMINOPHEN 5-325 MG PO TABS
1.0000 | ORAL_TABLET | Freq: Once | ORAL | Status: AC
  Administered 2023-11-27: 1 via ORAL
  Filled 2023-11-27: qty 1

## 2023-11-27 MED ORDER — ADULT MULTIVITAMIN W/MINERALS CH
1.0000 | ORAL_TABLET | Freq: Every day | ORAL | Status: DC
  Administered 2023-11-27: 1 via ORAL
  Filled 2023-11-27: qty 1

## 2023-11-27 MED ORDER — INSULIN ASPART 100 UNIT/ML IJ SOLN
0.0000 [IU] | Freq: Three times a day (TID) | INTRAMUSCULAR | Status: DC
  Administered 2023-11-27: 8 [IU] via SUBCUTANEOUS
  Filled 2023-11-27: qty 1

## 2023-11-27 MED ORDER — INSULIN GLARGINE-YFGN 100 UNIT/ML ~~LOC~~ SOLN
15.0000 [IU] | Freq: Two times a day (BID) | SUBCUTANEOUS | Status: DC
  Administered 2023-11-27: 15 [IU] via SUBCUTANEOUS
  Filled 2023-11-27 (×3): qty 0.15

## 2023-11-27 MED ORDER — LANTUS 100 UNIT/ML ~~LOC~~ SOLN: 30.0000 [IU] | Freq: Every day | SUBCUTANEOUS | 11 refills | Status: DC

## 2023-11-27 MED ORDER — LANCETS MISC: 1.0000 | Freq: Three times a day (TID) | 2 refills | Status: AC

## 2023-11-27 MED ORDER — INSULIN ASPART 100 UNIT/ML IJ SOLN: 0.0000 [IU] | Freq: Every day | INTRAMUSCULAR | Status: DC

## 2023-11-27 MED ORDER — INSULIN SYRINGE-NEEDLE U-100 25G X 5/8" 1 ML MISC: 1.0000 | Freq: Three times a day (TID) | 2 refills | Status: AC

## 2023-11-27 NOTE — Progress Notes (Signed)
  Transition of Care [facility]) Screening Note   Patient Details  Name: Megan Mullins Date of Birth: 1971-01-08   Transition of Care [facility] CM/SW Contact:    Hoy DELENA Bigness, LCSW Phone Number: 11/27/2023, 10:17 AM    Transition of Care Department [facility]) has reviewed patient and no TOC needs have been identified at this time. We will continue to monitor patient advancement through interdisciplinary progression rounds. If new patient transition needs arise, please place a TOC consult.    11/27/23 1016  TOC Brief Assessment  Insurance and Status Reviewed  Patient has primary care physician No (PCP list added to AVS)  Home environment has been reviewed From home  Prior level of function: Independent  Social Drivers of Health Review SDOH reviewed no interventions necessary  Readmission risk has been reviewed Yes  Transition of care needs no transition of care needs at this time

## 2023-11-27 NOTE — Plan of Care (Signed)
  Problem: Education: Goal: Knowledge of General Education information will improve Description: Including pain rating scale, medication(s)/side effects and non-pharmacologic comfort measures Outcome: Progressing   Problem: Activity: Goal: Risk for activity intolerance will decrease Outcome: Progressing   Problem: Nutrition: Goal: Adequate nutrition will be maintained Outcome: Progressing   Problem: Coping: Goal: Level of anxiety will decrease Outcome: Progressing   Problem: Pain Managment: Goal: General experience of comfort will improve and/or be controlled Outcome: Progressing   Problem: Safety: Goal: Ability to remain free from injury will improve Outcome: Progressing   Problem: Coping: Goal: Ability to adjust to condition or change in health will improve Outcome: Progressing

## 2023-11-27 NOTE — Inpatient Diabetes Management (Addendum)
 Inpatient Diabetes Program Recommendations  AACE/ADA: New Consensus Statement on Inpatient Glycemic Control (2015)  Target Ranges:  Prepandial:   less than 140 mg/dL      Peak postprandial:   less than 180 mg/dL (1-2 hours)      Critically ill patients:  140 - 180 mg/dL   Lab Results  Component Value Date   GLUCAP 154 (H) 11/27/2023   HGBA1C >15.5 (H) 11/21/2022    Review of Glycemic Control  Latest Reference Range & Units 11/27/23 07:27 11/27/23 08:29 11/27/23 10:29 11/27/23 11:31  Glucose-Capillary 70 - 99 mg/dL 860 (H) 870 (H) 826 (H) 154 (H)   Diabetes history: DM 2 Outpatient Diabetes medications:  Lantus  30 units q HS Current orders for Inpatient glycemic control:  IV insulin - Inpatient Diabetes Program Recommendations:   Agree with current orders.  For transition off insulin  drip, consider Semglee  15 units daily and Novolog  sensitive correction q 4 hours.  Last A1C was >15.5% in 2024.  Was patient taking insulin ?  Will speak to her when appropriate.   Thanks,  Randall Bullocks, RN, BC-ADM Inpatient Diabetes Coordinator Pager [phone]  (8a-5p)

## 2023-11-27 NOTE — Discharge Summary (Signed)
 Physician Discharge Summary   Patient: Megan Mullins MRN: [id number] DOB: [birth year]  Admit date:     11/26/2023  Discharge date: 11/27/23  Discharge Physician: Eric Nunnery   PCP: Patient, No Pcp Per   Recommendations at discharge:  Reassess complete resolution of patient left groin wound/abscess Reassess CBG's fluctuation and A1C with further adjustment to hypoglycemia regimen as needed.. Repeat basic metabolic panel to follow electrolytes and renal function Reassess blood pressure and adjust antihypertensive as required.  Discharge Diagnoses: Principal Problem:   Type 2 diabetes mellitus with hyperglycemia (HCC) Active Problems:   Abscess of left groin   Essential hypertension   Pseudohyponatremia   CKD stage 3b, GFR 30-44 ml/min [facility])   Abscess  Brief Hospital admission narrative: Megan Mullins is a 53 y.o. female with medical history significant for hypertension, diabetes mellitus, multifocal atrial tachycardia, necrotizing infection involving the right groin. Patient presented to the ED with complaints of swelling and pain to the left groin that she noticed yesterday.  No fevers no chills, no genitourinary or GI symptoms.  She denies increased thirst.  She reports compliance with her Lantus  30 units at night daily.  But she does not check her sugars.   ED Course: Temperature 98.2.  Heart rate 88- 106.  Respirate rate 14-18.  Blood pressure 138-174.  O2 sats greater 98% on room air. Blood glucose 280.  Anion gap of 11, serum bicarb of 26. Incision and drainage done by ED provider with drainage about 5 cc of purulent fluid.  1 L bolus given.  Insulin  drip started.  Assessment and Plan: 1-type 2 diabetes mellitus with hyperosmolar hyperglycemic crisis - Most likely driven by acute left groin abscess - Status post I&D; fluid resuscitation and insulin  drip provided - Patient hyperglycemic crisis resolved at discharge - Will continue treatment with home insulin  regimen -  Patient using Lantus  and sliding scale as an outpatient - Patient advised to maintain adequate hydration and to follow a modified carbohydrate diet.  2-left groin abscess -status post I&D - Excellent response to IV antibiotics and based on previous cultures results and sensitivity patient has been discharged on Augmentin and doxycycline. - Local wound care instructions have been provided. - Left thigh images demonstrating no deeper abscess of necrotizing changes.  3-acute kidney injury chronic kidney disease stage IIIb/pseudohyponatremia - In the setting of dehydration and hyperglycemia - Improved/stabilized and corrected after taking care of patient's elevated blood sugar and provided fluid resuscitation - Safe to resume home antihypertensive agents/diuretics at discharge - Repeat basic metabolic panel follow-up visit - Patient advised to maintain adequate hydration.  4-hypertension - Resume home antihypertensive agent - Heart healthy/low-sodium diet discussed with patient.  5-class I obesity - Low-calorie diet and portion control discussed with patient. -Body mass index is 33.07 kg/m.   6-hyperlipidemia - Continue statin.  Consultants: None Procedures performed: See below for x-ray report; I&D of left groin abscess performed in the ED. Disposition: Home Diet recommendation: Heart healthy/modified carbohydrate diet.  DISCHARGE MEDICATION: Allergies as of 11/27/2023   No Known Allergies      Medication List     TAKE these medications    amLODipine  5 MG tablet Commonly known as: NORVASC  Take 1 tablet (5 mg total) by mouth daily.   amoxicillin -clavulanate 875-125 MG tablet Commonly known as: AUGMENTIN Take 1 tablet by mouth 2 (two) times daily for 7 days.   atorvastatin  20 MG tablet Commonly known as: LIPITOR Take 20 mg by mouth daily.   doxycycline 100 MG  capsule Commonly known as: VIBRAMYCIN Take 1 capsule (100 mg total) by mouth 2 (two) times daily for 7  days.   Insulin  Syringe-Needle U-100 25G X 5/8 1 ML Misc 1 each by Does not apply route 3 (three) times daily. May dispense any manufacturer covered by patient's insurance.   Lancets Misc 1 each by Does not apply route 3 (three) times daily. Use as directed to check blood sugar. May dispense any manufacturer covered by patient's insurance and fits patient's device.   Lantus  100 UNIT/ML injection Generic drug: insulin  glargine Inject 0.3 mLs (30 Units total) into the skin at bedtime.   losartan -hydrochlorothiazide 50-12.5 MG tablet Commonly known as: HYZAAR Take 1 tablet by mouth daily.               Discharge Care Instructions  (From admission, onward)           Start     Ordered   11/27/23 0000  Discharge wound care:       Comments: Cleanse L groin wound with Vashe, do not rinse and allow to air dry. Using a Q tip applicator insert 1/2 iodoform gauze daily, cover with dry gauze and secure with clothe tape.   11/27/23 1650            Follow-up Information     Patient, No Pcp Per In 3 days.   Specialty: General Practice Why: For wound re-check        [facility]. Go in 3 days.   Why: 2, For wound re-check Contact information: [address] [place]  [phone]               Discharge Exam: [name]   11/26/23 1432  Weight: 79.4 kg   General exam: Alert, awake, oriented x 3; no nausea, no vomiting, no acute distress.  Feeling ready to go home. Respiratory system: Clear to auscultation. Respiratory effort normal.  Good saturation on room air. Cardiovascular system:RRR. No murmurs, rubs, gallops. Gastrointestinal system: Abdomen is nondistended, soft and nontender. No organomegaly or masses felt. Normal bowel sounds heard. Central nervous system: No focal neurological deficits. Extremities: No cyanosis or clubbing.  No edema. Skin: No petechiae or rashes; left groin area with packing and clean dressings in  place.  No significant tenderness. Psychiatry: Judgement and insight appear normal. Mood & affect appropriate.    Condition at discharge: Stable and improved.  The results of significant diagnostics from this hospitalization (including imaging, microbiology, ancillary and laboratory) are listed below for reference.   Imaging Studies: CT PELVIS WO CONTRAST Result Date: 11/27/2023 EXAM: CT PELVIS, WITHOUT IV CONTRAST 11/27/2023 12:02:53 AM TECHNIQUE: Axial images were acquired through the pelvis without IV contrast. Reformatted images were reviewed. Automated exposure control, iterative reconstruction, and/or weight based adjustment of the mA/kV was utilized to reduce the radiation dose to as low as reasonably achievable. COMPARISON: Comparison exam 09/04/2023. CLINICAL HISTORY: Left groin abscess, history of right groin necrotizing infection. Diabetes mellitus. FINDINGS: BONES: Degenerative changes of the lumbar spine and hip joints bilaterally. SOFT TISSUES: The previously seen area of inflammatory change in the right mons pubis and inguinal region has resolved. A new area of edema is noted in the mons pubis on the left with a few small foci of subcutaneous air. No drainable fluid collection is identified. INTRAPELVIC CONTENTS: Visualized large and small bowel show no acute abnormality. Diverticular change without diverticulitis is noted. The uterus is within normal limits. The bladder is well distended. Multiple ovarian phleboliths  are seen. No free fluid is noted within the pelvis. IMPRESSION: 1. New left mons pubis cellulitis with small subcutaneous gas and no drainable abscess identified. 2. Resolution of prior right mons pubis and right inguinal inflammatory changes. Electronically signed by: Oneil Devonshire MD 11/27/2023 12:09 AM EST RP Workstation: [id number]    Microbiology: Results for orders placed or performed during the hospital encounter of 11/26/23  MRSA Next Gen by PCR, Nasal     Status:  None   Collection Time: 11/26/23  7:17 PM   Specimen: Nasal Mucosa; Nasal Swab  Result Value Ref Range Status   MRSA by PCR Next Gen NOT DETECTED NOT DETECTED Final    Comment: (NOTE) The GeneXpert MRSA Assay (FDA approved for NASAL specimens only), is one component of a comprehensive MRSA colonization surveillance program. It is not intended to diagnose MRSA infection nor to guide or monitor treatment for MRSA infections. Test performance is not FDA approved in patients less than 48 years old. Performed at [facility], [address], [place], [place] [address]     Labs: CBC: Recent Labs  Lab 11/26/23 1617  WBC 6.9  NEUTROABS 5.3  HGB 15.6*  HCT 47.4*  MCV 93.9  PLT 337   Basic Metabolic Panel: Recent Labs  Lab 11/26/23 1617 11/27/23 0425  NA 127* 137  K 5.2* 3.7  CL 89* 102  CO2 26 26  GLUCOSE 680* 144*  BUN 16 12  CREATININE 1.81* 1.63*  CALCIUM  9.6 9.0   CBG: Recent Labs  Lab 11/27/23 1131 11/27/23 1229 11/27/23 1335 11/27/23 1528 11/27/23 1623  GLUCAP 154* 144* 168* 207* 291*    Discharge time spent:  35 minutes.  Signed: Eric Nunnery, MD [facility] 11/27/2023

## 2023-11-27 NOTE — Consult Note (Signed)
 WOC Nurse Consult Note: patient with history of necrotizing fascitis R groin; now presenting with abscess to L groin  Reason for Consult: L groin  Wound type: full thickness L groin infectious post I&D in ER 11/10; packed with 1/2 Iodoform gauze in ED post I&D  Pressure Injury POA: NA  Measurement: see nursing flowsheet  Wound bed: red moist  Drainage (amount, consistency, odor) purulent per MD notes  Periwound: Dressing procedure/placement/frequency: Cleanse L groin wound with Vashe, do not rinse and allow to air dry. Using a Q tip applicator insert 1/2 iodoform gauze daily, cover with dry gauze and secure with clothe tape.   POC discussed with bedside nurse. WOC team will not follow. Re-consult if further needs arise.   Thank you,    Powell Bar MSN, RN-BC, [employer]

## 2023-11-27 NOTE — Progress Notes (Signed)
 Went over discharge instructions w/ pt.

## 2023-11-27 NOTE — Plan of Care (Signed)
  Problem: Education: Goal: Knowledge of General Education information will improve Description: Including pain rating scale, medication(s)/side effects and non-pharmacologic comfort measures Outcome: Completed/Met   Problem: Health Behavior/Discharge Planning: Goal: Ability to manage health-related needs will improve Outcome: Completed/Met   Problem: Clinical Measurements: Goal: Ability to maintain clinical measurements within normal limits will improve Outcome: Completed/Met Goal: Will remain free from infection Outcome: Completed/Met Goal: Diagnostic test results will improve Outcome: Completed/Met Goal: Respiratory complications will improve Outcome: Completed/Met Goal: Cardiovascular complication will be avoided Outcome: Completed/Met   Problem: Activity: Goal: Risk for activity intolerance will decrease Outcome: Completed/Met   Problem: Nutrition: Goal: Adequate nutrition will be maintained Outcome: Completed/Met   Problem: Coping: Goal: Level of anxiety will decrease Outcome: Completed/Met   Problem: Elimination: Goal: Will not experience complications related to bowel motility Outcome: Completed/Met Goal: Will not experience complications related to urinary retention Outcome: Completed/Met   Problem: Pain Managment: Goal: General experience of comfort will improve and/or be controlled Outcome: Completed/Met   Problem: Safety: Goal: Ability to remain free from injury will improve Outcome: Completed/Met   Problem: Skin Integrity: Goal: Risk for impaired skin integrity will decrease Outcome: Completed/Met   Problem: Education: Goal: Ability to describe self-care measures that may prevent or decrease complications (Diabetes Survival Skills Education) will improve Outcome: Completed/Met Goal: Individualized Educational Video(s) Outcome: Completed/Met   Problem: Coping: Goal: Ability to adjust to condition or change in health will improve Outcome:  Completed/Met   Problem: Fluid Volume: Goal: Ability to maintain a balanced intake and output will improve Outcome: Completed/Met   Problem: Health Behavior/Discharge Planning: Goal: Ability to identify and utilize available resources and services will improve Outcome: Completed/Met Goal: Ability to manage health-related needs will improve Outcome: Completed/Met   Problem: Metabolic: Goal: Ability to maintain appropriate glucose levels will improve Outcome: Completed/Met   Problem: Nutritional: Goal: Maintenance of adequate nutrition will improve Outcome: Completed/Met Goal: Progress toward achieving an optimal weight will improve Outcome: Completed/Met   Problem: Skin Integrity: Goal: Risk for impaired skin integrity will decrease Outcome: Completed/Met   Problem: Tissue Perfusion: Goal: Adequacy of tissue perfusion will improve Outcome: Completed/Met   Problem: Education: Goal: Ability to describe self-care measures that may prevent or decrease complications (Diabetes Survival Skills Education) will improve Outcome: Completed/Met Goal: Individualized Educational Video(s) Outcome: Completed/Met   Problem: Cardiac: Goal: Ability to maintain an adequate cardiac output will improve Outcome: Completed/Met   Problem: Health Behavior/Discharge Planning: Goal: Ability to identify and utilize available resources and services will improve Outcome: Completed/Met Goal: Ability to manage health-related needs will improve Outcome: Completed/Met   Problem: Fluid Volume: Goal: Ability to achieve a balanced intake and output will improve Outcome: Completed/Met   Problem: Metabolic: Goal: Ability to maintain appropriate glucose levels will improve Outcome: Completed/Met   Problem: Nutritional: Goal: Maintenance of adequate nutrition will improve Outcome: Completed/Met Goal: Maintenance of adequate weight for body size and type will improve Outcome: Completed/Met   Problem:  Respiratory: Goal: Will regain and/or maintain adequate ventilation Outcome: Completed/Met   Problem: Urinary Elimination: Goal: Ability to achieve and maintain adequate renal perfusion and functioning will improve Outcome: Completed/Met

## 2023-11-27 NOTE — Progress Notes (Signed)
 Initial Nutrition Assessment  DOCUMENTATION CODES:   Not applicable  INTERVENTION:   Diabetes diet education provided. Add MVI with minerals daily when diet is advanced.   NUTRITION DIAGNOSIS:   Increased nutrient needs related to wound healing as evidenced by estimated needs.  GOAL:   Patient will meet greater than or equal to 90% of their needs  MONITOR:   Diet advancement, PO intake, Skin  REASON FOR ASSESSMENT:   Rounds    ASSESSMENT:   53 yo female admitted with L groin abscess. PMH includes DM-2, necrotizing soft tissue infection S/P I&D April 2025, tobacco use.  Patient reports recent decreased intake d/t toothache. She has been eating softer foods for the past few weeks. She endorses weight loss of a few pounds. She does not typically follow a special diet at home. Typically, she eats 2 meals per day. Encouraged adequate protein intake to support healing.   RD provided Plate Method for Diabetes handout from the Academy of [employer] Dietetics. Discussed different food groups and their effects on blood sugar, emphasizing carbohydrate-containing foods. Provided list of carbohydrates and recommended serving sizes of common foods.  Discussed importance of controlled and consistent carbohydrate intake throughout the day. Provided examples of ways to balance meals/snacks and encouraged intake of high-fiber, whole grain complex carbohydrates.   Weight history reviewed. 5% weight loss over the past year is not significant for the time frame.   Labs reviewed.  CBG: [phone]  Medications reviewed and include insulin  drip, IV antibiotics. IVF: D5 LR at 125 ml/h  NUTRITION - FOCUSED PHYSICAL EXAM:  Flowsheet Row Most Recent Value  Orbital Region No depletion  Upper Arm Region No depletion  Thoracic and Lumbar Region No depletion  Buccal Region No depletion  Temple Region Mild depletion  Clavicle Bone Region Mild depletion  Clavicle and Acromion Bone  Region No depletion  Scapular Bone Region No depletion  Dorsal Hand No depletion  Patellar Region No depletion  Anterior Thigh Region No depletion  Posterior Calf Region No depletion  Edema (RD Assessment) None  Hair Reviewed  Eyes Reviewed  Mouth Reviewed  Skin Reviewed  Nails Reviewed    Diet Order:   Diet Order             Diet NPO time specified  Diet effective now                   EDUCATION NEEDS:   Education needs have been addressed  Skin:  Skin Assessment: Skin Integrity Issues: Skin Integrity Issues:: Other (Comment) Other: L groin abscess  Last BM:  11/9  Height:   Ht Readings from Last 1 Encounters:  11/26/23 5' 1 (1.549 m)    Weight:   Wt Readings from Last 1 Encounters:  11/26/23 79.4 kg    Ideal Body Weight:  47.7 kg  BMI:  Body mass index is 33.07 kg/m.  Estimated Nutritional Needs:   Kcal:  1600-1800  Protein:  80-95 gm  Fluid:  1.6-1.8 L   Suzen HUNT RD, LDN, CNSC Contact via secure chat. If unavailable, use group chat RD Inpatient.

## 2023-11-28 LAB — HEMOGLOBIN A1C
Hgb A1c MFr Bld: >15.5 % — ABNORMAL HIGH (ref 4.8–5.6)
Mean Plasma Glucose: >398 mg/dL

## 2024-02-06 ENCOUNTER — Other Ambulatory Visit: Payer: Self-pay

## 2024-02-06 ENCOUNTER — Encounter (HOSPITAL_COMMUNITY): Payer: Self-pay

## 2024-02-06 ENCOUNTER — Inpatient Hospital Stay (HOSPITAL_COMMUNITY)
Admission: EM | Admit: 2024-02-06 | Discharge: 2024-02-09 | DRG: 417 | Disposition: A | Attending: Family Medicine | Admitting: Family Medicine

## 2024-02-06 ENCOUNTER — Emergency Department (HOSPITAL_COMMUNITY)

## 2024-02-06 DIAGNOSIS — K5909 Other constipation: Secondary | ICD-10-CM | POA: Diagnosis present

## 2024-02-06 DIAGNOSIS — Z794 Long term (current) use of insulin: Secondary | ICD-10-CM

## 2024-02-06 DIAGNOSIS — Z6835 Body mass index (BMI) 35.0-35.9, adult: Secondary | ICD-10-CM

## 2024-02-06 DIAGNOSIS — Z79890 Hormone replacement therapy (postmenopausal): Secondary | ICD-10-CM

## 2024-02-06 DIAGNOSIS — Z8249 Family history of ischemic heart disease and other diseases of the circulatory system: Secondary | ICD-10-CM

## 2024-02-06 DIAGNOSIS — E66812 Obesity, class 2: Secondary | ICD-10-CM | POA: Diagnosis present

## 2024-02-06 DIAGNOSIS — N1832 Chronic kidney disease, stage 3b: Secondary | ICD-10-CM | POA: Diagnosis present

## 2024-02-06 DIAGNOSIS — J95821 Acute postprocedural respiratory failure: Secondary | ICD-10-CM | POA: Diagnosis not present

## 2024-02-06 DIAGNOSIS — F17210 Nicotine dependence, cigarettes, uncomplicated: Secondary | ICD-10-CM | POA: Diagnosis present

## 2024-02-06 DIAGNOSIS — E1322 Other specified diabetes mellitus with diabetic chronic kidney disease: Secondary | ICD-10-CM | POA: Diagnosis not present

## 2024-02-06 DIAGNOSIS — I081 Rheumatic disorders of both mitral and tricuspid valves: Secondary | ICD-10-CM | POA: Diagnosis present

## 2024-02-06 DIAGNOSIS — Z833 Family history of diabetes mellitus: Secondary | ICD-10-CM

## 2024-02-06 DIAGNOSIS — K760 Fatty (change of) liver, not elsewhere classified: Secondary | ICD-10-CM | POA: Diagnosis present

## 2024-02-06 DIAGNOSIS — I5023 Acute on chronic systolic (congestive) heart failure: Secondary | ICD-10-CM | POA: Diagnosis present

## 2024-02-06 DIAGNOSIS — R109 Unspecified abdominal pain: Secondary | ICD-10-CM | POA: Diagnosis present

## 2024-02-06 DIAGNOSIS — I130 Hypertensive heart and chronic kidney disease with heart failure and stage 1 through stage 4 chronic kidney disease, or unspecified chronic kidney disease: Secondary | ICD-10-CM | POA: Diagnosis present

## 2024-02-06 DIAGNOSIS — E1122 Type 2 diabetes mellitus with diabetic chronic kidney disease: Secondary | ICD-10-CM | POA: Diagnosis present

## 2024-02-06 DIAGNOSIS — E1100 Type 2 diabetes mellitus with hyperosmolarity without nonketotic hyperglycemic-hyperosmolar coma (NKHHC): Secondary | ICD-10-CM

## 2024-02-06 DIAGNOSIS — F1410 Cocaine abuse, uncomplicated: Secondary | ICD-10-CM | POA: Diagnosis present

## 2024-02-06 DIAGNOSIS — I2722 Pulmonary hypertension due to left heart disease: Secondary | ICD-10-CM | POA: Diagnosis present

## 2024-02-06 DIAGNOSIS — F1010 Alcohol abuse, uncomplicated: Secondary | ICD-10-CM | POA: Diagnosis present

## 2024-02-06 DIAGNOSIS — E119 Type 2 diabetes mellitus without complications: Secondary | ICD-10-CM

## 2024-02-06 DIAGNOSIS — K5730 Diverticulosis of large intestine without perforation or abscess without bleeding: Secondary | ICD-10-CM | POA: Diagnosis present

## 2024-02-06 DIAGNOSIS — R1011 Right upper quadrant pain: Principal | ICD-10-CM

## 2024-02-06 DIAGNOSIS — I10 Essential (primary) hypertension: Secondary | ICD-10-CM | POA: Diagnosis not present

## 2024-02-06 DIAGNOSIS — K828 Other specified diseases of gallbladder: Principal | ICD-10-CM | POA: Diagnosis present

## 2024-02-06 DIAGNOSIS — I2723 Pulmonary hypertension due to lung diseases and hypoxia: Secondary | ICD-10-CM | POA: Diagnosis present

## 2024-02-06 DIAGNOSIS — E1165 Type 2 diabetes mellitus with hyperglycemia: Secondary | ICD-10-CM | POA: Diagnosis present

## 2024-02-06 DIAGNOSIS — G4733 Obstructive sleep apnea (adult) (pediatric): Secondary | ICD-10-CM | POA: Diagnosis present

## 2024-02-06 DIAGNOSIS — Z79899 Other long term (current) drug therapy: Secondary | ICD-10-CM

## 2024-02-06 HISTORY — DX: Essential (primary) hypertension: I10

## 2024-02-06 LAB — CBC WITH DIFFERENTIAL/PLATELET
Abs Immature Granulocytes: 0.01 K/uL (ref 0.00–0.07)
Basophils Absolute: 0 K/uL (ref 0.0–0.1)
Basophils Relative: 1 %
Eosinophils Absolute: 0 K/uL (ref 0.0–0.5)
Eosinophils Relative: 0 %
HCT: 41.4 % (ref 36.0–46.0)
Hemoglobin: 13.7 g/dL (ref 12.0–15.0)
Immature Granulocytes: 0 %
Lymphocytes Relative: 30 %
Lymphs Abs: 1.6 K/uL (ref 0.7–4.0)
MCH: 32.4 pg (ref 26.0–34.0)
MCHC: 33.1 g/dL (ref 30.0–36.0)
MCV: 97.9 fL (ref 80.0–100.0)
Monocytes Absolute: 0.2 K/uL (ref 0.1–1.0)
Monocytes Relative: 4 %
Neutro Abs: 3.4 K/uL (ref 1.7–7.7)
Neutrophils Relative %: 65 %
Platelets: 383 K/uL (ref 150–400)
RBC: 4.23 MIL/uL (ref 3.87–5.11)
RDW: 15.6 % — ABNORMAL HIGH (ref 11.5–15.5)
WBC: 5.3 K/uL (ref 4.0–10.5)
nRBC: 0 % (ref 0.0–0.2)

## 2024-02-06 LAB — COMPREHENSIVE METABOLIC PANEL WITH GFR
ALT: 36 U/L (ref 0–44)
AST: 42 U/L — ABNORMAL HIGH (ref 15–41)
Albumin: 3.5 g/dL (ref 3.5–5.0)
Alkaline Phosphatase: 214 U/L — ABNORMAL HIGH (ref 38–126)
Anion gap: 14 (ref 5–15)
BUN: 27 mg/dL — ABNORMAL HIGH (ref 6–20)
CO2: 21 mmol/L — ABNORMAL LOW (ref 22–32)
Calcium: 9.3 mg/dL (ref 8.9–10.3)
Chloride: 103 mmol/L (ref 98–111)
Creatinine, Ser: 1.91 mg/dL — ABNORMAL HIGH (ref 0.44–1.00)
GFR, Estimated: 31 mL/min — ABNORMAL LOW
Glucose, Bld: 116 mg/dL — ABNORMAL HIGH (ref 70–99)
Potassium: 4.2 mmol/L (ref 3.5–5.1)
Sodium: 139 mmol/L (ref 135–145)
Total Bilirubin: 0.7 mg/dL (ref 0.0–1.2)
Total Protein: 7.1 g/dL (ref 6.5–8.1)

## 2024-02-06 LAB — PROTIME-INR
INR: 0.9 (ref 0.8–1.2)
Prothrombin Time: 13.1 s (ref 11.4–15.2)

## 2024-02-06 LAB — GLUCOSE, CAPILLARY
Glucose-Capillary: 175 mg/dL — ABNORMAL HIGH (ref 70–99)
Glucose-Capillary: 147 mg/dL — ABNORMAL HIGH (ref 70–99)

## 2024-02-06 LAB — LIPASE, BLOOD: Lipase: <10 U/L — ABNORMAL LOW (ref 11–51)

## 2024-02-06 MED ORDER — SODIUM CHLORIDE 0.9 % IV BOLUS
1000.0000 mL | Freq: Once | INTRAVENOUS | Status: AC
  Administered 2024-02-06: 1000 mL via INTRAVENOUS

## 2024-02-06 MED ORDER — SODIUM CHLORIDE 0.9 % IV SOLN: INTRAVENOUS | Status: AC

## 2024-02-06 MED ORDER — ONDANSETRON HCL 4 MG PO TABS: 4.0000 mg | ORAL_TABLET | Freq: Four times a day (QID) | ORAL | Status: DC | PRN

## 2024-02-06 MED ORDER — INSULIN ASPART 100 UNIT/ML IJ SOLN
0.0000 [IU] | Freq: Four times a day (QID) | INTRAMUSCULAR | Status: DC
  Administered 2024-02-06: 3 [IU] via SUBCUTANEOUS
  Filled 2024-02-06: qty 1

## 2024-02-06 MED ORDER — INSULIN GLARGINE 100 UNIT/ML ~~LOC~~ SOLN
10.0000 [IU] | Freq: Every day | SUBCUTANEOUS | Status: DC
  Administered 2024-02-06: 10 [IU] via SUBCUTANEOUS
  Filled 2024-02-06 (×2): qty 0.1

## 2024-02-06 MED ORDER — HYDROMORPHONE HCL 1 MG/ML IJ SOLN
1.0000 mg | Freq: Once | INTRAMUSCULAR | Status: AC
  Administered 2024-02-06: 1 mg via INTRAVENOUS
  Filled 2024-02-06: qty 1

## 2024-02-06 MED ORDER — ACETAMINOPHEN 325 MG PO TABS: 650.0000 mg | ORAL_TABLET | Freq: Four times a day (QID) | ORAL | Status: DC | PRN

## 2024-02-06 MED ORDER — ATORVASTATIN CALCIUM 20 MG PO TABS
20.0000 mg | ORAL_TABLET | Freq: Every day | ORAL | Status: DC
  Administered 2024-02-06 – 2024-02-09 (×3): 20 mg via ORAL
  Filled 2024-02-06 (×4): qty 1

## 2024-02-06 MED ORDER — HYDROMORPHONE HCL 1 MG/ML IJ SOLN
0.5000 mg | INTRAMUSCULAR | Status: DC | PRN
  Administered 2024-02-06 – 2024-02-07 (×2): 0.5 mg via INTRAVENOUS
  Filled 2024-02-06 (×2): qty 0.5

## 2024-02-06 MED ORDER — ONDANSETRON HCL 4 MG/2ML IJ SOLN
4.0000 mg | Freq: Once | INTRAMUSCULAR | Status: AC
  Administered 2024-02-06: 4 mg via INTRAVENOUS
  Filled 2024-02-06: qty 2

## 2024-02-06 MED ORDER — ACETAMINOPHEN 650 MG RE SUPP: 650.0000 mg | Freq: Four times a day (QID) | RECTAL | Status: DC | PRN

## 2024-02-06 MED ORDER — ONDANSETRON HCL 4 MG/2ML IJ SOLN: 4.0000 mg | Freq: Four times a day (QID) | INTRAMUSCULAR | Status: DC | PRN

## 2024-02-06 MED ORDER — IOHEXOL 300 MG/ML  SOLN
80.0000 mL | Freq: Once | INTRAMUSCULAR | Status: AC | PRN
  Administered 2024-02-06: 80 mL via INTRAVENOUS

## 2024-02-06 MED ORDER — LABETALOL HCL 5 MG/ML IV SOLN
10.0000 mg | INTRAVENOUS | Status: DC | PRN
  Administered 2024-02-07: 5 mg via INTRAVENOUS
  Administered 2024-02-07 – 2024-02-08 (×2): 10 mg via INTRAVENOUS
  Filled 2024-02-06: qty 1
  Filled 2024-02-06 (×2): qty 4

## 2024-02-06 MED ORDER — AMLODIPINE BESYLATE 5 MG PO TABS
5.0000 mg | ORAL_TABLET | Freq: Every day | ORAL | Status: DC
  Administered 2024-02-06 – 2024-02-08 (×2): 5 mg via ORAL
  Filled 2024-02-06 (×3): qty 1

## 2024-02-06 MED ORDER — MORPHINE SULFATE (PF) 4 MG/ML IV SOLN
4.0000 mg | Freq: Once | INTRAVENOUS | Status: AC
  Administered 2024-02-06: 4 mg via INTRAVENOUS
  Filled 2024-02-06: qty 1

## 2024-02-06 MED ORDER — POLYETHYLENE GLYCOL 3350 17 G PO PACK: 17.0000 g | PACK | Freq: Every day | ORAL | Status: DC | PRN

## 2024-02-06 NOTE — ED Triage Notes (Signed)
 Patietn BIB [facility] for complaint of right side and back pain that started at 6 yesterday was constipated took medication and was able to have a bowel movement, but hen began to have pain. CBG 164 with EMS, vitals BP 184/146 10% room air, . Hx of Diabetes.

## 2024-02-06 NOTE — H&P (Addendum)
 " History and Physical    Megan Mullins FMW:[id number] DOB: [birth year] DOA: 02/06/2024  PCP: Patient, No Pcp Per   Patient coming from: Home  I have personally briefly reviewed patient's old medical records in [facility]  Chief Complaint: Abdominal pain  HPI: Megan Mullins is a 54 y.o. female with medical history significant for hypertension, diabetes mellitus, CKD 3b. Patient presented to the ED with complaints of pain in her right upper abdomen yesterday yesterday.  She reports she was constipated took a stool softener and had a bowel movement and pain subsequently started.  No vomiting, no diarrhea.  She has had this persistent pain since onset.  No urinary symptoms.  ED Course: Temperature 97.6.  Heart rate 97-102.  Respiratory rate 19-20.  Blood pressure systolic 140s to 839d.  O2 sats greater 96% on room air. Normal lipase less than 10, WBC 5.3. Mildly elevated ALP 214, AST 42. CT abdomen pelvis with contrast- Fatty liver with possible hepatitis. Diffuse gallbladder wall thickening versus pericholecystic edema which may be related to underlying liver disease.  Ultrasound recommended. Subsequent RUQ ultrasound-no gallstone.  Thickened gallbladder wall with reported sonographic Murphy sign.  Acalculous cholecystitis not excluded.  HIDA recommended. EDP talked to general surgeon Dr. Evonnie.  Will see in consult, agreed with HIDA.   Review of Systems: As per HPI all other systems reviewed and negative.  Past Medical History:  Diagnosis Date   Diabetes mellitus without complication (HCC)     Past Surgical History:  Procedure Laterality Date   IRRIGATION AND DEBRIDEMENT OF NECROTIZING SOFT TISSUE INFECTION Right 04/23/2023   Procedure: IRRIGATION, DRAINAGE AND DEBRIDEMENT OF NECROTIZING SOFT TISSUE INFECTION RIGHT GROIN DOWN TO MUSCLE, 11CM X 12CM X 5CM;  Surgeon: Evonnie Dorothyann LABOR, DO;  Location: [facility];  Service: General;  Laterality: Right;   TUBAL LIGATION        reports that she has been smoking cigarettes. She has never used smokeless tobacco. She reports that she does not currently use alcohol. She reports that she does not currently use drugs after having used the following drugs: Cocaine.  Allergies[1]  Family History  Problem Relation Age of Onset   Heart disease Mother    Diabetes Mother     Prior to Admission medications  Medication Sig Start Date End Date Taking? Authorizing Provider  amLODipine  (NORVASC ) 5 MG tablet Take 1 tablet (5 mg total) by mouth daily. 04/27/23   Raenelle Coria, MD  atorvastatin  (LIPITOR) 20 MG tablet Take 20 mg by mouth daily. 07/19/22   [provider]  Insulin  Syringe-Needle U-100 25G X 5/8 1 ML MISC 1 each by Does not apply route 3 (three) times daily. May dispense any manufacturer covered by patient's insurance. 11/27/23   Ricky Fines, MD  Lancets MISC 1 each by Does not apply route 3 (three) times daily. Use as directed to check blood sugar. May dispense any manufacturer covered by patient's insurance and fits patient's device. 11/27/23   Ricky Fines, MD  LANTUS  100 UNIT/ML injection Inject 0.3 mLs (30 Units total) into the skin at bedtime. 11/27/23   Ricky Fines, MD  losartan -hydrochlorothiazide (HYZAAR) 50-12.5 MG tablet Take 1 tablet by mouth daily. 11/02/23   [provider]    Physical Exam: Vitals:   02/06/24 1331  BP: (!) 151/102  Pulse: (!) 102  Resp: 20  Temp: 97.6 F (36.4 C)  TempSrc: Oral  SpO2: 99%  Weight: 80.7 kg  Height: 5' (1.524 m)    Constitutional:  NAD, calm, comfortable Vitals:   02/06/24 1331  BP: (!) 151/102  Pulse: (!) 102  Resp: 20  Temp: 97.6 F (36.4 C)  TempSrc: Oral  SpO2: 99%  Weight: 80.7 kg  Height: 5' (1.524 m)   Eyes: PERRL, lids and conjunctivae normal ENMT: Mucous membranes are moist.  Neck: normal, supple, no masses, no thyromegaly Respiratory: clear to auscultation bilaterally, no wheezing, no crackles. Normal respiratory  effort. No accessory muscle use.  Cardiovascular: Regular rate and rhythm, no murmurs / rubs / gallops. No extremity edema.  Extremities warm. Abdomen: Moderate RUQ tenderness, no masses palpated. No hepatosplenomegaly.  Musculoskeletal: no clubbing / cyanosis. No joint deformity upper and lower extremities.  Skin: no rashes, lesions, ulcers. No induration Neurologic: No facial asymmetry, will extremity spontaneously, speech fluent.Megan Mullins  Psychiatric: Normal judgment and insight. Alert and oriented x 3. Normal mood.   Labs on Admission: I have personally reviewed following labs and imaging studies  CBC: Recent Labs  Lab 02/06/24 1343  WBC 5.3  NEUTROABS 3.4  HGB 13.7  HCT 41.4  MCV 97.9  PLT 383   Basic Metabolic Panel: Recent Labs  Lab 02/06/24 1343  NA 139  K 4.2  CL 103  CO2 21*  GLUCOSE 116*  BUN 27*  CREATININE 1.91*  CALCIUM  9.3   GFR: Estimated Creatinine Clearance: 32.8 mL/min (A) (by C-G formula based on SCr of 1.91 mg/dL (H)). Liver Function Tests: Recent Labs  Lab 02/06/24 1343  AST 42*  ALT 36  ALKPHOS 214*  BILITOT 0.7  PROT 7.1  ALBUMIN 3.5   Recent Labs  Lab 02/06/24 1343  LIPASE <10*    Radiological Exams on Admission: US  Abdomen Limited RUQ (LIVER/GB) Result Date: 02/06/2024 CLINICAL DATA:  Right upper quadrant abdominal pain. EXAM: ULTRASOUND ABDOMEN LIMITED RIGHT UPPER QUADRANT COMPARISON:  CT abdomen pelvis dated 02/06/2024. FINDINGS: Gallbladder: No gallstone. The gallbladder wall is thickened and edematous measuring up to 13 mm. Positive sonographic Murphy's sign is reported. Common bile duct: Diameter: 4 mm Liver: There is diffuse increased liver echogenicity most commonly seen in the setting of fatty infiltration. Superimposed inflammation or fibrosis is not excluded. Clinical correlation is recommended. Portal vein is patent on color Doppler imaging with normal direction of blood flow towards the liver. Other: None. IMPRESSION: 1. No  gallstone. Thickened gallbladder wall with reported sonographic Murphy's sign. Although findings may be related to underlying liver disease, an acalculous cholecystitis cannot be excluded. A hepatobiliary scintigraphy may provide better evaluation of the gallbladder if there is a high clinical concern for acute cholecystitis . 2. Fatty liver. Electronically Signed   By: Vanetta Chou M.D.   On: 02/06/2024 16:19   CT ABDOMEN PELVIS W CONTRAST Result Date: 02/06/2024 CLINICAL DATA:  Right-sided abdominal pain. EXAM: CT ABDOMEN AND PELVIS WITH CONTRAST TECHNIQUE: Multidetector CT imaging of the abdomen and pelvis was performed using the standard protocol following bolus administration of intravenous contrast. RADIATION DOSE REDUCTION: This exam was performed according to the departmental dose-optimization program which includes automated exposure control, adjustment of the mA and/or kV according to patient size and/or use of iterative reconstruction technique. CONTRAST:  80mL OMNIPAQUE  IOHEXOL  300 MG/ML  SOLN COMPARISON:  CT abdomen pelvis dated 07/28/2021. FINDINGS: Lower chest: The visualized lung bases are clear. No intra-abdominal free air or free fluid. Hepatobiliary: Fatty appearing liver. Slight heterogeneous enhancement of the liver parenchyma may represent hepatitis. Clinical correlation recommended. No biliary dilatation. No calcified gallstone. There is diffuse gallbladder wall thickening versus pericholecystic edema which  may be related to underlying liver disease. Ultrasound may provide better evaluation if there is clinical concern for acute gallbladder pathology. Pancreas: Unremarkable. No pancreatic ductal dilatation or surrounding inflammatory changes. Spleen: Normal in size without focal abnormality. Adrenals/Urinary Tract: The right adrenal glands unremarkable. Mild left adrenal thickening/hyperplasia or adenoma. There is mild bilateral renal parenchyma atrophy. There is no hydronephrosis on  either side. There is symmetric enhancement and excretion of contrast by both kidneys. The visualized ureters and urinary bladder appear unremarkable. Stomach/Bowel: There is sigmoid diverticulosis. There is no bowel obstruction or active inflammation. The appendix is normal. Vascular/Lymphatic: Mild aortoiliac atherosclerotic disease. The IVC is unremarkable. No portal venous gas. There is no adenopathy. Reproductive: The uterus is anteverted. No suspicious adnexal masses. Other: Mild diffuse subcutaneous edema Musculoskeletal: Severe arthritic changes of the hips. No acute osseous pathology. IMPRESSION: 1. Fatty liver with possible hepatitis. Clinical correlation is recommended. 2. Diffuse gallbladder wall thickening versus pericholecystic edema which may be related to underlying liver disease. Ultrasound may provide better evaluation if there is clinical concern for acute gallbladder pathology. 3. Sigmoid diverticulosis. No bowel obstruction. Normal appendix. 4.  Aortic Atherosclerosis (ICD10-I70.0). Electronically Signed   By: Vanetta Chou M.D.   On: 02/06/2024 15:46   EKG: Pending  Assessment/Plan Principal Problem:   Right sided abdominal pain Active Problems:   Essential hypertension   CKD stage 3b, GFR 30-44 ml/min (HCC)   DM (diabetes mellitus) (HCC)   Assessment and Plan:  Right-sided abdominal pain-of 1 day duration.  No vomiting.  Chronically elevated ALP 214, mild elevated AST 42.  CT initially obtained and subsequently ultrasound as recommended-fatty liver with possible hepatitis.  No gallstone, thickened gallbladder wall, concern for acalculous acute cholecystitis.  HIDA recommended. -Dr. Evonnie consulted will see in consult agree with HIDA - N.p.o. midnight -Acute hepatitis panel - Lipid panel in a.m. - N/s 100cc/hr x 15hrs - IV Dilaudid  0.5 every 4 hours as needed - Low threshold to start antibiotics  Hypertension-systolic 140s to 839d with elevated diastolic in the  879d -Resume Norvasc  - Holding losartan  HCTZ for contrast exposure in setting of CKD 3B - As needed labetalol  10 mg for blood pressure > 170/120.  Diabetes mellitus-uncontrolled, A1c  > 15.5 -11/26/2023.   - Resume Lantus  at reduced dose 10 units daily (30u home dose) - SSI- M  CKD 3B-creatinine 1.9 - at baseline.  DVT prophylaxis: SCDS pending Gen surg eval Code Status: FULL Family Communication: None at bedside Disposition Plan: ~ 2 days Consults called: Gen Surg Admission status:  Obs Med surg   Author: Tully FORBES Carwin, MD 02/06/2024 8:03 PM  For on call review [email or web address].      [1] No Known Allergies  "

## 2024-02-06 NOTE — ED Notes (Signed)
 Patient transported to MRI

## 2024-02-06 NOTE — Progress Notes (Signed)
 Nurse at bedside,patient c/o pain to IV site, right antecubital .New IV started, 22 gauge to right hand times one attempt,patient tolerate procedure.Plan of care on going.

## 2024-02-06 NOTE — Progress Notes (Signed)
 Nurse at bedside,patient alert and oriented times four. Patient's blood pressure was 147/120,Dr Ejiroghene Emokpae notified. Patient did c/o abdominal pain to right quadrant rated a 8,sharp,and constant. Plan of care on going.

## 2024-02-06 NOTE — ED Provider Notes (Signed)
 " [place] EMERGENCY DEPARTMENT AT [facility] Provider Note   CSN: [id number] Arrival date & time: 02/06/24  1324     Patient presents with: Back Pain and Flank Pain   Megan Mullins is a 54 y.o. female.  Patient is a 54 year old female with a history of type 2 diabetes, substance abuse, pyelonephritis who presents to the ED for right abdominal pain radiating to the back that began around 6 PM last evening.  Patient notes she deals with intermittent constipation.  She did take a laxative yesterday morning and had a normal bowel movement yesterday.  States she was feeling better after the bowel movement until the severe pain started.  Pain has been constant and has been getting worse.  No previous abdominal surgeries.  Denies fevers, chest pain, shortness of breath, nausea/vomiting/diarrhea, urinary symptoms.  No further complaints.    Back Pain Associated symptoms: abdominal pain   Associated symptoms: no chest pain, no dysuria and no fever   Flank Pain Associated symptoms include abdominal pain. Pertinent negatives include no chest pain and no shortness of breath.       Prior to Admission medications  Medication Sig Start Date End Date Taking? Authorizing Provider  amLODipine  (NORVASC ) 5 MG tablet Take 1 tablet (5 mg total) by mouth daily. 04/27/23   Raenelle Coria, MD  atorvastatin  (LIPITOR) 20 MG tablet Take 20 mg by mouth daily. 07/19/22   [provider]  Insulin  Syringe-Needle U-100 25G X 5/8 1 ML MISC 1 each by Does not apply route 3 (three) times daily. May dispense any manufacturer covered by patient's insurance. 11/27/23   Ricky Fines, MD  Lancets MISC 1 each by Does not apply route 3 (three) times daily. Use as directed to check blood sugar. May dispense any manufacturer covered by patient's insurance and fits patient's device. 11/27/23   Ricky Fines, MD  LANTUS  100 UNIT/ML injection Inject 0.3 mLs (30 Units total) into the skin at bedtime. 11/27/23    Ricky Fines, MD  losartan -hydrochlorothiazide (HYZAAR) 50-12.5 MG tablet Take 1 tablet by mouth daily. 11/02/23   [provider]    Allergies: Patient has no known allergies.    Review of Systems  Constitutional:  Negative for chills and fever.  Respiratory:  Negative for shortness of breath.   Cardiovascular:  Negative for chest pain.  Gastrointestinal:  Positive for abdominal pain and constipation. Negative for diarrhea, nausea and vomiting.  Genitourinary:  Positive for flank pain. Negative for dysuria and hematuria.  Musculoskeletal:  Positive for back pain.  All other systems reviewed and are negative.   Updated Vital Signs BP (!) 151/102   Pulse (!) 102   Temp 97.6 F (36.4 C) (Oral)   Resp 20   Ht 5' (1.524 m)   Wt 80.7 kg   LMP 07/18/2021   SpO2 99%   BMI 34.76 kg/m   Physical Exam Constitutional:      Appearance: Normal appearance.     Comments: Uncomfortable lying on the bed  HENT:     Head: Normocephalic and atraumatic.     Nose: Nose normal.     Mouth/Throat:     Mouth: Mucous membranes are moist.     Pharynx: Oropharynx is clear.  Cardiovascular:     Rate and Rhythm: Tachycardia present.  Pulmonary:     Effort: Pulmonary effort is normal.     Breath sounds: Normal breath sounds.  Abdominal:     General: Bowel sounds are normal.  Palpations: Abdomen is soft.     Tenderness: There is abdominal tenderness. There is right CVA tenderness.     Comments: Right lower and upper quadrant tenderness.  Positive Murphy sign.  Negative McBurney's point.  Right flank tenderness appreciated.  No masses, guarding, distention  Musculoskeletal:        General: Normal range of motion.  Skin:    General: Skin is warm and dry.  Neurological:     Mental Status: She is alert and oriented to person, place, and time.  Psychiatric:        Mood and Affect: Mood normal.        Behavior: Behavior normal.     (all labs ordered are listed, but only abnormal  results are displayed) Labs Reviewed  COMPREHENSIVE METABOLIC PANEL WITH GFR - Abnormal; Notable for the following components:      Result Value   CO2 21 (*)    Glucose, Bld 116 (*)    BUN 27 (*)    Creatinine, Ser 1.91 (*)    AST 42 (*)    Alkaline Phosphatase 214 (*)    GFR, Estimated 31 (*)    All other components within normal limits  CBC WITH DIFFERENTIAL/PLATELET - Abnormal; Notable for the following components:   RDW 15.6 (*)    All other components within normal limits  LIPASE, BLOOD - Abnormal; Notable for the following components:   Lipase <10 (*)    All other components within normal limits  URINALYSIS, ROUTINE W REFLEX MICROSCOPIC  PROTIME-INR    EKG: None  Radiology: US  Abdomen Limited RUQ (LIVER/GB) Result Date: 02/06/2024 CLINICAL DATA:  Right upper quadrant abdominal pain. EXAM: ULTRASOUND ABDOMEN LIMITED RIGHT UPPER QUADRANT COMPARISON:  CT abdomen pelvis dated 02/06/2024. FINDINGS: Gallbladder: No gallstone. The gallbladder wall is thickened and edematous measuring up to 13 mm. Positive sonographic Murphy's sign is reported. Common bile duct: Diameter: 4 mm Liver: There is diffuse increased liver echogenicity most commonly seen in the setting of fatty infiltration. Superimposed inflammation or fibrosis is not excluded. Clinical correlation is recommended. Portal vein is patent on color Doppler imaging with normal direction of blood flow towards the liver. Other: None. IMPRESSION: 1. No gallstone. Thickened gallbladder wall with reported sonographic Murphy's sign. Although findings may be related to underlying liver disease, an acalculous cholecystitis cannot be excluded. A hepatobiliary scintigraphy may provide better evaluation of the gallbladder if there is a high clinical concern for acute cholecystitis . 2. Fatty liver. Electronically Signed   By: Vanetta Chou M.D.   On: 02/06/2024 16:19   CT ABDOMEN PELVIS W CONTRAST Result Date: 02/06/2024 CLINICAL DATA:   Right-sided abdominal pain. EXAM: CT ABDOMEN AND PELVIS WITH CONTRAST TECHNIQUE: Multidetector CT imaging of the abdomen and pelvis was performed using the standard protocol following bolus administration of intravenous contrast. RADIATION DOSE REDUCTION: This exam was performed according to the departmental dose-optimization program which includes automated exposure control, adjustment of the mA and/or kV according to patient size and/or use of iterative reconstruction technique. CONTRAST:  80mL OMNIPAQUE  IOHEXOL  300 MG/ML  SOLN COMPARISON:  CT abdomen pelvis dated 07/28/2021. FINDINGS: Lower chest: The visualized lung bases are clear. No intra-abdominal free air or free fluid. Hepatobiliary: Fatty appearing liver. Slight heterogeneous enhancement of the liver parenchyma may represent hepatitis. Clinical correlation recommended. No biliary dilatation. No calcified gallstone. There is diffuse gallbladder wall thickening versus pericholecystic edema which may be related to underlying liver disease. Ultrasound may provide better evaluation if there is clinical concern  for acute gallbladder pathology. Pancreas: Unremarkable. No pancreatic ductal dilatation or surrounding inflammatory changes. Spleen: Normal in size without focal abnormality. Adrenals/Urinary Tract: The right adrenal glands unremarkable. Mild left adrenal thickening/hyperplasia or adenoma. There is mild bilateral renal parenchyma atrophy. There is no hydronephrosis on either side. There is symmetric enhancement and excretion of contrast by both kidneys. The visualized ureters and urinary bladder appear unremarkable. Stomach/Bowel: There is sigmoid diverticulosis. There is no bowel obstruction or active inflammation. The appendix is normal. Vascular/Lymphatic: Mild aortoiliac atherosclerotic disease. The IVC is unremarkable. No portal venous gas. There is no adenopathy. Reproductive: The uterus is anteverted. No suspicious adnexal masses. Other: Mild  diffuse subcutaneous edema Musculoskeletal: Severe arthritic changes of the hips. No acute osseous pathology. IMPRESSION: 1. Fatty liver with possible hepatitis. Clinical correlation is recommended. 2. Diffuse gallbladder wall thickening versus pericholecystic edema which may be related to underlying liver disease. Ultrasound may provide better evaluation if there is clinical concern for acute gallbladder pathology. 3. Sigmoid diverticulosis. No bowel obstruction. Normal appendix. 4.  Aortic Atherosclerosis (ICD10-I70.0). Electronically Signed   By: Vanetta Chou M.D.   On: 02/06/2024 15:46      Medications Ordered in the ED  morphine  (PF) 4 MG/ML injection 4 mg (4 mg Intravenous Given 02/06/24 1350)  ondansetron  (ZOFRAN ) injection 4 mg (4 mg Intravenous Given 02/06/24 1350)  sodium chloride  0.9 % bolus 1,000 mL (0 mLs Intravenous Stopped 02/06/24 1750)  iohexol  (OMNIPAQUE ) 300 MG/ML solution 80 mL (80 mLs Intravenous Contrast Given 02/06/24 1519)  HYDROmorphone  (DILAUDID ) injection 1 mg (1 mg Intravenous Given 02/06/24 1714)    Clinical Course as of 02/06/24 1751  Wed Feb 06, 2024  1422 Creatinine(!): 1.91 Similar to baseline [AY]  1704 Consulted Dr. Evonnie with general surgery who advised admit to hospitalist for HIDA scan. [AY]    Clinical Course User Index [AY] Neysa Thersia RAMAN, PA-C                                Medical Decision Making Patient is a 54 year old female with a history of type 2 diabetes, substance abuse, pyelonephritis who presents to the ED for right upper abdominal pain rating to the back that began last evening.  Please see detailed HPI above.  On exam patient is alert and uncomfortable lying in the bed.  Physical exam as noted above.  She is noted to have significant right-sided tenderness as well as positive Murphy sign in the right upper quadrant.  Differential includes cholecystitis, choledocholithiasis, appendicitis, nephrolithiasis, acute abdomen,  enteritis.  Lab workup shows alk phos of 214, this is slightly increased from baseline.  Otherwise labs similar to baseline including creatinine of 1.9.  No leukocytosis appreciated.  CT abdomen pelvis obtained that shows fatty liver disease and some inflammation around the gallbladder.  There is diverticulosis but no acute signs of diverticulitis or bowel obstruction.  Appendix is normal.  Ultrasound of the right upper quadrant was obtained that shows a thickened gallbladder wall approximately 13 mm.  There is no sludge or acute stones noted.  He does have a positive Murphy sign as well.  Advised to correlate clinically and further evaluation could be obtained with potential HIDA scan.  These results were discussed with Dr. Evonnie with general surgery who advised there would be nothing acutely surgical from their standpoint.  Advised to get the HIDA scan and could consult as needed.  Hospitalist has been consulted and are agreeable  to admit patient for further evaluation.  Patient has been given morphine  and Dilaudid  with mild improvement in pain.  Patient stable while in ED.  Amount and/or Complexity of Data Reviewed Labs: ordered. Decision-making details documented in ED Course. Radiology: ordered.  Risk Prescription drug management. Decision regarding hospitalization.       Final diagnoses:  Right upper quadrant pain  Thickening of wall of gallbladder    ED Discharge Orders     None          Neysa Thersia GORMAN DEVONNA 02/06/24 1751    Garrick Charleston, MD 02/06/24 2234  "

## 2024-02-07 ENCOUNTER — Observation Stay (HOSPITAL_COMMUNITY): Admitting: Certified Registered Nurse Anesthetist

## 2024-02-07 ENCOUNTER — Encounter (HOSPITAL_COMMUNITY): Admission: EM | Payer: Self-pay | Source: Home / Self Care | Attending: Internal Medicine

## 2024-02-07 ENCOUNTER — Observation Stay (HOSPITAL_COMMUNITY)

## 2024-02-07 ENCOUNTER — Encounter (HOSPITAL_COMMUNITY): Payer: Self-pay | Admitting: Internal Medicine

## 2024-02-07 DIAGNOSIS — Z8249 Family history of ischemic heart disease and other diseases of the circulatory system: Secondary | ICD-10-CM | POA: Diagnosis not present

## 2024-02-07 DIAGNOSIS — I2722 Pulmonary hypertension due to left heart disease: Secondary | ICD-10-CM | POA: Diagnosis present

## 2024-02-07 DIAGNOSIS — I081 Rheumatic disorders of both mitral and tricuspid valves: Secondary | ICD-10-CM | POA: Diagnosis present

## 2024-02-07 DIAGNOSIS — F17210 Nicotine dependence, cigarettes, uncomplicated: Secondary | ICD-10-CM | POA: Diagnosis present

## 2024-02-07 DIAGNOSIS — I130 Hypertensive heart and chronic kidney disease with heart failure and stage 1 through stage 4 chronic kidney disease, or unspecified chronic kidney disease: Secondary | ICD-10-CM | POA: Diagnosis present

## 2024-02-07 DIAGNOSIS — F1410 Cocaine abuse, uncomplicated: Secondary | ICD-10-CM | POA: Diagnosis present

## 2024-02-07 DIAGNOSIS — Z833 Family history of diabetes mellitus: Secondary | ICD-10-CM | POA: Diagnosis not present

## 2024-02-07 DIAGNOSIS — R1011 Right upper quadrant pain: Secondary | ICD-10-CM | POA: Diagnosis present

## 2024-02-07 DIAGNOSIS — I5023 Acute on chronic systolic (congestive) heart failure: Secondary | ICD-10-CM | POA: Diagnosis present

## 2024-02-07 DIAGNOSIS — Z794 Long term (current) use of insulin: Secondary | ICD-10-CM | POA: Diagnosis not present

## 2024-02-07 DIAGNOSIS — K828 Other specified diseases of gallbladder: Principal | ICD-10-CM

## 2024-02-07 DIAGNOSIS — E66812 Obesity, class 2: Secondary | ICD-10-CM | POA: Diagnosis present

## 2024-02-07 DIAGNOSIS — Z6835 Body mass index (BMI) 35.0-35.9, adult: Secondary | ICD-10-CM | POA: Diagnosis not present

## 2024-02-07 DIAGNOSIS — I272 Pulmonary hypertension, unspecified: Secondary | ICD-10-CM | POA: Diagnosis not present

## 2024-02-07 DIAGNOSIS — I2723 Pulmonary hypertension due to lung diseases and hypoxia: Secondary | ICD-10-CM | POA: Diagnosis present

## 2024-02-07 DIAGNOSIS — N1832 Chronic kidney disease, stage 3b: Secondary | ICD-10-CM | POA: Diagnosis present

## 2024-02-07 DIAGNOSIS — K5730 Diverticulosis of large intestine without perforation or abscess without bleeding: Secondary | ICD-10-CM | POA: Diagnosis present

## 2024-02-07 DIAGNOSIS — E1122 Type 2 diabetes mellitus with diabetic chronic kidney disease: Secondary | ICD-10-CM | POA: Diagnosis present

## 2024-02-07 DIAGNOSIS — G4733 Obstructive sleep apnea (adult) (pediatric): Secondary | ICD-10-CM | POA: Diagnosis present

## 2024-02-07 DIAGNOSIS — K5909 Other constipation: Secondary | ICD-10-CM | POA: Diagnosis present

## 2024-02-07 DIAGNOSIS — K760 Fatty (change of) liver, not elsewhere classified: Secondary | ICD-10-CM | POA: Diagnosis present

## 2024-02-07 DIAGNOSIS — Z79899 Other long term (current) drug therapy: Secondary | ICD-10-CM | POA: Diagnosis not present

## 2024-02-07 DIAGNOSIS — I5021 Acute systolic (congestive) heart failure: Secondary | ICD-10-CM | POA: Diagnosis not present

## 2024-02-07 DIAGNOSIS — F1010 Alcohol abuse, uncomplicated: Secondary | ICD-10-CM | POA: Diagnosis present

## 2024-02-07 DIAGNOSIS — R109 Unspecified abdominal pain: Secondary | ICD-10-CM | POA: Diagnosis not present

## 2024-02-07 DIAGNOSIS — J95821 Acute postprocedural respiratory failure: Secondary | ICD-10-CM | POA: Diagnosis not present

## 2024-02-07 DIAGNOSIS — R0609 Other forms of dyspnea: Secondary | ICD-10-CM | POA: Diagnosis not present

## 2024-02-07 DIAGNOSIS — Z79890 Hormone replacement therapy (postmenopausal): Secondary | ICD-10-CM | POA: Diagnosis not present

## 2024-02-07 DIAGNOSIS — E1165 Type 2 diabetes mellitus with hyperglycemia: Secondary | ICD-10-CM | POA: Diagnosis present

## 2024-02-07 LAB — BLOOD GAS, ARTERIAL
Acid-base deficit: 4.8 mmol/L — ABNORMAL HIGH (ref 0.0–2.0)
Acid-base deficit: 2.8 mmol/L — ABNORMAL HIGH (ref 0.0–2.0)
Bicarbonate: 22.4 mmol/L (ref 20.0–28.0)
Bicarbonate: 24.4 mmol/L (ref 20.0–28.0)
Drawn by: 442
Drawn by: 39071
O2 Saturation: 94 %
O2 Saturation: 98.7 %
Patient temperature: 36.5
Patient temperature: 36.4
pCO2 arterial: 49 mmHg — ABNORMAL HIGH (ref 32–48)
pCO2 arterial: 51 mmHg — ABNORMAL HIGH (ref 32–48)
pH, Arterial: 7.27 — ABNORMAL LOW (ref 7.35–7.45)
pH, Arterial: 7.29 — ABNORMAL LOW (ref 7.35–7.45)
pO2, Arterial: 70 mmHg — ABNORMAL LOW (ref 83–108)
pO2, Arterial: 120 mmHg — ABNORMAL HIGH (ref 83–108)

## 2024-02-07 LAB — COMPREHENSIVE METABOLIC PANEL WITH GFR
ALT: 33 U/L (ref 0–44)
AST: 33 U/L (ref 15–41)
Albumin: 3.2 g/dL — ABNORMAL LOW (ref 3.5–5.0)
Alkaline Phosphatase: 180 U/L — ABNORMAL HIGH (ref 38–126)
Anion gap: 14 (ref 5–15)
BUN: 26 mg/dL — ABNORMAL HIGH (ref 6–20)
CO2: 22 mmol/L (ref 22–32)
Calcium: 8.7 mg/dL — ABNORMAL LOW (ref 8.9–10.3)
Chloride: 106 mmol/L (ref 98–111)
Creatinine, Ser: 2.09 mg/dL — ABNORMAL HIGH (ref 0.44–1.00)
GFR, Estimated: 28 mL/min — ABNORMAL LOW
Glucose, Bld: 104 mg/dL — ABNORMAL HIGH (ref 70–99)
Potassium: 3.9 mmol/L (ref 3.5–5.1)
Sodium: 141 mmol/L (ref 135–145)
Total Bilirubin: 0.4 mg/dL (ref 0.0–1.2)
Total Protein: 6.3 g/dL — ABNORMAL LOW (ref 6.5–8.1)

## 2024-02-07 LAB — GLUCOSE, CAPILLARY
Glucose-Capillary: 35 mg/dL — CL (ref 70–99)
Glucose-Capillary: 130 mg/dL — ABNORMAL HIGH (ref 70–99)
Glucose-Capillary: 45 mg/dL — ABNORMAL LOW (ref 70–99)
Glucose-Capillary: 141 mg/dL — ABNORMAL HIGH (ref 70–99)
Glucose-Capillary: 146 mg/dL — ABNORMAL HIGH (ref 70–99)
Glucose-Capillary: 50 mg/dL — ABNORMAL LOW (ref 70–99)
Glucose-Capillary: 95 mg/dL (ref 70–99)
Glucose-Capillary: 79 mg/dL (ref 70–99)
Glucose-Capillary: 66 mg/dL — ABNORMAL LOW (ref 70–99)
Glucose-Capillary: 59 mg/dL — ABNORMAL LOW (ref 70–99)
Glucose-Capillary: 98 mg/dL (ref 70–99)
Glucose-Capillary: 100 mg/dL — ABNORMAL HIGH (ref 70–99)

## 2024-02-07 LAB — URINALYSIS, ROUTINE W REFLEX MICROSCOPIC
Bacteria, UA: NONE SEEN
Bilirubin Urine: NEGATIVE
Glucose, UA: 50 mg/dL — AB
Ketones, ur: NEGATIVE mg/dL
Leukocytes,Ua: NEGATIVE
Nitrite: NEGATIVE
Protein, ur: >=300 mg/dL — AB
Specific Gravity, Urine: 1.014 (ref 1.005–1.030)
pH: 6 (ref 5.0–8.0)

## 2024-02-07 LAB — LIPID PANEL
Cholesterol: 127 mg/dL (ref 0–200)
HDL: 67 mg/dL
LDL Cholesterol: 41 mg/dL (ref 0–99)
Total CHOL/HDL Ratio: 1.9 ratio
Triglycerides: 94 mg/dL
VLDL: 19 mg/dL (ref 0–40)

## 2024-02-07 LAB — HEPATITIS PANEL, ACUTE
HCV Ab: NONREACTIVE
Hep A IgM: NONREACTIVE
Hep B C IgM: NONREACTIVE
Hepatitis B Surface Ag: NONREACTIVE

## 2024-02-07 LAB — MRSA NEXT GEN BY PCR, NASAL: MRSA by PCR Next Gen: NOT DETECTED

## 2024-02-07 MED ORDER — HEPARIN SODIUM (PORCINE) 5000 UNIT/ML IJ SOLN
5000.0000 [IU] | Freq: Three times a day (TID) | INTRAMUSCULAR | Status: DC
  Administered 2024-02-07 – 2024-02-09 (×5): 5000 [IU] via SUBCUTANEOUS
  Filled 2024-02-07 (×5): qty 1

## 2024-02-07 MED ORDER — ACETAMINOPHEN 500 MG PO TABS
1000.0000 mg | ORAL_TABLET | Freq: Four times a day (QID) | ORAL | Status: DC
  Administered 2024-02-07 – 2024-02-09 (×8): 1000 mg via ORAL
  Filled 2024-02-07 (×8): qty 2

## 2024-02-07 MED ORDER — FENTANYL CITRATE (PF) 250 MCG/5ML IJ SOLN
INTRAMUSCULAR | Status: AC
  Filled 2024-02-07: qty 5

## 2024-02-07 MED ORDER — SODIUM CHLORIDE 0.9 % IV SOLN
2.0000 g | INTRAVENOUS | Status: DC
  Administered 2024-02-07 – 2024-02-08 (×2): 2 g via INTRAVENOUS
  Filled 2024-02-07 (×2): qty 20

## 2024-02-07 MED ORDER — LABETALOL HCL 5 MG/ML IV SOLN: 5.0000 mg | Freq: Once | INTRAVENOUS | Status: DC

## 2024-02-07 MED ORDER — HYDROMORPHONE HCL 1 MG/ML IJ SOLN
0.5000 mg | INTRAMUSCULAR | Status: DC | PRN
  Administered 2024-02-07 – 2024-02-09 (×3): 0.5 mg via INTRAVENOUS
  Filled 2024-02-07 (×3): qty 0.5

## 2024-02-07 MED ORDER — DEXTROSE 50 % IV SOLN: 50.0000 mL | Freq: Once | INTRAVENOUS | Status: AC

## 2024-02-07 MED ORDER — CHLORHEXIDINE GLUCONATE 0.12 % MT SOLN
OROMUCOSAL | Status: AC
  Filled 2024-02-07: qty 15

## 2024-02-07 MED ORDER — INSULIN ASPART 100 UNIT/ML IJ SOLN: 0.0000 [IU] | Freq: Every day | INTRAMUSCULAR | Status: DC

## 2024-02-07 MED ORDER — LIDOCAINE 2% (20 MG/ML) 5 ML SYRINGE
INTRAMUSCULAR | Status: DC | PRN
  Administered 2024-02-07: 80 mg via INTRAVENOUS

## 2024-02-07 MED ORDER — DEXTROSE 50 % IV SOLN
25.0000 g | INTRAVENOUS | Status: AC
  Administered 2024-02-07: 25 g via INTRAVENOUS

## 2024-02-07 MED ORDER — SODIUM CHLORIDE 0.9 % IV SOLN
2.0000 g | INTRAVENOUS | Status: AC
  Administered 2024-02-07: 2 g via INTRAVENOUS

## 2024-02-07 MED ORDER — ROCURONIUM BROMIDE 10 MG/ML (PF) SYRINGE
PREFILLED_SYRINGE | INTRAVENOUS | Status: DC | PRN
  Administered 2024-02-07: 20 mg via INTRAVENOUS
  Administered 2024-02-07: 70 mg via INTRAVENOUS

## 2024-02-07 MED ORDER — DEXTROSE 50 % IV SOLN
25.0000 g | INTRAVENOUS | Status: AC
  Administered 2024-02-07: 25 g via INTRAVENOUS
  Filled 2024-02-07: qty 50

## 2024-02-07 MED ORDER — ALBUTEROL SULFATE (2.5 MG/3ML) 0.083% IN NEBU: 2.5000 mg | INHALATION_SOLUTION | RESPIRATORY_TRACT | Status: DC | PRN

## 2024-02-07 MED ORDER — IPRATROPIUM-ALBUTEROL 0.5-2.5 (3) MG/3ML IN SOLN
3.0000 mL | Freq: Four times a day (QID) | RESPIRATORY_TRACT | Status: DC
  Administered 2024-02-07: 3 mL via RESPIRATORY_TRACT
  Filled 2024-02-07: qty 3

## 2024-02-07 MED ORDER — DEXAMETHASONE SOD PHOSPHATE PF 10 MG/ML IJ SOLN
INTRAMUSCULAR | Status: AC
  Filled 2024-02-07: qty 1

## 2024-02-07 MED ORDER — MIDAZOLAM HCL (PF) 2 MG/2ML IJ SOLN
INTRAMUSCULAR | Status: DC | PRN
  Administered 2024-02-07: 2 mg via INTRAVENOUS

## 2024-02-07 MED ORDER — INDOCYANINE GREEN 25 MG IJ SOLR
INTRAMUSCULAR | Status: AC
  Filled 2024-02-07: qty 10

## 2024-02-07 MED ORDER — FUROSEMIDE 10 MG/ML IJ SOLN
40.0000 mg | Freq: Three times a day (TID) | INTRAMUSCULAR | Status: AC
  Administered 2024-02-07 – 2024-02-08 (×3): 40 mg via INTRAVENOUS
  Filled 2024-02-07 (×3): qty 4

## 2024-02-07 MED ORDER — FUROSEMIDE 10 MG/ML IJ SOLN
40.0000 mg | Freq: Two times a day (BID) | INTRAMUSCULAR | Status: DC
  Administered 2024-02-07: 40 mg via INTRAVENOUS
  Filled 2024-02-07: qty 4

## 2024-02-07 MED ORDER — BUPIVACAINE HCL (PF) 0.5 % IJ SOLN
INTRAMUSCULAR | Status: AC
  Filled 2024-02-07: qty 30

## 2024-02-07 MED ORDER — CHLORHEXIDINE GLUCONATE 0.12 % MT SOLN
15.0000 mL | Freq: Once | OROMUCOSAL | Status: AC
  Administered 2024-02-07: 15 mL via OROMUCOSAL

## 2024-02-07 MED ORDER — CHLORHEXIDINE GLUCONATE 0.12 % MT SOLN: 15.0000 mL | Freq: Once | OROMUCOSAL | Status: DC

## 2024-02-07 MED ORDER — PROPOFOL 10 MG/ML IV BOLUS
INTRAVENOUS | Status: AC
  Filled 2024-02-07: qty 20

## 2024-02-07 MED ORDER — DEXTROSE-SODIUM CHLORIDE 5-0.45 % IV SOLN: INTRAVENOUS | Status: DC

## 2024-02-07 MED ORDER — INDOCYANINE GREEN 25 MG IJ SOLR
2.5000 mg | Freq: Once | INTRAMUSCULAR | Status: AC
  Administered 2024-02-07: 2.5 mg via INTRAVENOUS

## 2024-02-07 MED ORDER — INSULIN GLARGINE 100 UNIT/ML ~~LOC~~ SOLN
6.0000 [IU] | Freq: Every day | SUBCUTANEOUS | Status: DC
  Filled 2024-02-07 (×3): qty 0.06

## 2024-02-07 MED ORDER — LACTATED RINGERS IV SOLN: INTRAVENOUS | Status: DC

## 2024-02-07 MED ORDER — IPRATROPIUM-ALBUTEROL 0.5-2.5 (3) MG/3ML IN SOLN
3.0000 mL | Freq: Once | RESPIRATORY_TRACT | Status: AC
  Administered 2024-02-07: 3 mL via RESPIRATORY_TRACT
  Filled 2024-02-07: qty 3

## 2024-02-07 MED ORDER — ORAL CARE MOUTH RINSE: 15.0000 mL | Freq: Once | OROMUCOSAL | Status: AC

## 2024-02-07 MED ORDER — AZITHROMYCIN 250 MG PO TABS
500.0000 mg | ORAL_TABLET | Freq: Every day | ORAL | Status: AC
  Administered 2024-02-07 – 2024-02-09 (×3): 500 mg via ORAL
  Filled 2024-02-07 (×3): qty 2

## 2024-02-07 MED ORDER — OXYCODONE HCL 5 MG PO TABS
5.0000 mg | ORAL_TABLET | ORAL | Status: DC | PRN
  Administered 2024-02-07 – 2024-02-09 (×5): 5 mg via ORAL
  Filled 2024-02-07 (×5): qty 1

## 2024-02-07 MED ORDER — HYDROMORPHONE HCL 1 MG/ML IJ SOLN: 0.2500 mg | INTRAMUSCULAR | Status: DC | PRN

## 2024-02-07 MED ORDER — CHLORHEXIDINE GLUCONATE CLOTH 2 % EX PADS
6.0000 | MEDICATED_PAD | Freq: Once | CUTANEOUS | Status: AC
  Administered 2024-02-07: 6 via TOPICAL

## 2024-02-07 MED ORDER — MIDAZOLAM HCL 2 MG/2ML IJ SOLN
INTRAMUSCULAR | Status: AC
  Filled 2024-02-07: qty 2

## 2024-02-07 MED ORDER — FENTANYL CITRATE (PF) 250 MCG/5ML IJ SOLN
INTRAMUSCULAR | Status: DC | PRN
  Administered 2024-02-07: 100 ug via INTRAVENOUS

## 2024-02-07 MED ORDER — SUGAMMADEX SODIUM 200 MG/2ML IV SOLN
INTRAVENOUS | Status: DC | PRN
  Administered 2024-02-07: 200 mg via INTRAVENOUS

## 2024-02-07 MED ORDER — DEXTROSE 50 % IV SOLN
INTRAVENOUS | Status: AC
  Administered 2024-02-07: 50 mL via INTRAVENOUS
  Filled 2024-02-07: qty 50

## 2024-02-07 MED ORDER — SODIUM CHLORIDE 0.9 % IV SOLN
INTRAVENOUS | Status: AC
  Filled 2024-02-07: qty 2

## 2024-02-07 MED ORDER — SINCALIDE 5 MCG IJ SOLR
0.0200 ug/kg | Freq: Once | INTRAMUSCULAR | Status: AC
  Administered 2024-02-07: 1.6 ug via INTRAVENOUS

## 2024-02-07 MED ORDER — EPHEDRINE SULFATE (PRESSORS) 25 MG/5ML IV SOSY
PREFILLED_SYRINGE | INTRAVENOUS | Status: DC | PRN
  Administered 2024-02-07 (×2): 5 mg via INTRAVENOUS

## 2024-02-07 MED ORDER — ALBUTEROL SULFATE HFA 108 (90 BASE) MCG/ACT IN AERS
INHALATION_SPRAY | RESPIRATORY_TRACT | Status: DC | PRN
  Administered 2024-02-07: 2 via RESPIRATORY_TRACT

## 2024-02-07 MED ORDER — CHLORHEXIDINE GLUCONATE CLOTH 2 % EX PADS: 6.0000 | MEDICATED_PAD | Freq: Once | CUTANEOUS | Status: DC

## 2024-02-07 MED ORDER — DEXTROSE 50 % IV SOLN
INTRAVENOUS | Status: AC
  Filled 2024-02-07: qty 50

## 2024-02-07 MED ORDER — SEVOFLURANE IN SOLN
RESPIRATORY_TRACT | Status: AC
  Filled 2024-02-07: qty 500

## 2024-02-07 MED ORDER — LABETALOL HCL 5 MG/ML IV SOLN
INTRAVENOUS | Status: AC
  Filled 2024-02-07: qty 4

## 2024-02-07 MED ORDER — SINCALIDE 5 MCG IJ SOLR
INTRAMUSCULAR | Status: AC
  Filled 2024-02-07: qty 5

## 2024-02-07 MED ORDER — INSULIN ASPART 100 UNIT/ML IJ SOLN
0.0000 [IU] | Freq: Three times a day (TID) | INTRAMUSCULAR | Status: DC
  Administered 2024-02-08: 2 [IU] via SUBCUTANEOUS
  Administered 2024-02-09: 1 [IU] via SUBCUTANEOUS
  Filled 2024-02-07 (×2): qty 1

## 2024-02-07 MED ORDER — DEXTROSE-SODIUM CHLORIDE 5-0.45 % IV SOLN: INTRAVENOUS | Status: AC

## 2024-02-07 MED ORDER — ORAL CARE MOUTH RINSE: 15.0000 mL | Freq: Once | OROMUCOSAL | Status: DC

## 2024-02-07 MED ORDER — TECHNETIUM TC 99M MEBROFENIN IV KIT
5.1000 | PACK | Freq: Once | INTRAVENOUS | Status: AC | PRN
  Administered 2024-02-07: 5.1 via INTRAVENOUS

## 2024-02-07 MED ORDER — OXYCODONE HCL 5 MG/5ML PO SOLN: 5.0000 mg | Freq: Once | ORAL | Status: DC | PRN

## 2024-02-07 MED ORDER — OXYCODONE HCL 5 MG PO TABS: 5.0000 mg | ORAL_TABLET | Freq: Once | ORAL | Status: DC | PRN

## 2024-02-07 MED ORDER — BUPIVACAINE HCL (PF) 0.5 % IJ SOLN
INTRAMUSCULAR | Status: DC | PRN
  Administered 2024-02-07: 30 mL

## 2024-02-07 MED ORDER — ONDANSETRON HCL 4 MG/2ML IJ SOLN
INTRAMUSCULAR | Status: AC
  Filled 2024-02-07: qty 4

## 2024-02-07 MED ORDER — PHENYLEPHRINE 80 MCG/ML (10ML) SYRINGE FOR IV PUSH (FOR BLOOD PRESSURE SUPPORT)
PREFILLED_SYRINGE | INTRAVENOUS | Status: DC | PRN
  Administered 2024-02-07 (×2): 160 ug via INTRAVENOUS
  Administered 2024-02-07: 80 ug via INTRAVENOUS
  Administered 2024-02-07: 160 ug via INTRAVENOUS
  Administered 2024-02-07: 80 ug via INTRAVENOUS
  Administered 2024-02-07: 160 ug via INTRAVENOUS

## 2024-02-07 MED ORDER — STERILE WATER FOR IRRIGATION IR SOLN
Status: DC | PRN
  Administered 2024-02-07: 1000 mL

## 2024-02-07 MED ORDER — IPRATROPIUM-ALBUTEROL 0.5-2.5 (3) MG/3ML IN SOLN
3.0000 mL | Freq: Three times a day (TID) | RESPIRATORY_TRACT | Status: DC
  Administered 2024-02-08 (×2): 3 mL via RESPIRATORY_TRACT
  Filled 2024-02-07 (×4): qty 3

## 2024-02-07 MED ORDER — ONDANSETRON HCL 4 MG/2ML IJ SOLN
INTRAMUSCULAR | Status: DC | PRN
  Administered 2024-02-07: 4 mg via INTRAVENOUS

## 2024-02-07 MED ORDER — PROPOFOL 10 MG/ML IV BOLUS
INTRAVENOUS | Status: DC | PRN
  Administered 2024-02-07: 180 mg via INTRAVENOUS

## 2024-02-07 MED ORDER — STERILE WATER FOR INJECTION IJ SOLN
INTRAMUSCULAR | Status: AC
  Filled 2024-02-07: qty 10

## 2024-02-07 MED ORDER — DEXTROSE 50 % IV SOLN
50.0000 mL | Freq: Once | INTRAVENOUS | Status: AC
  Administered 2024-02-07: 50 mL via INTRAVENOUS

## 2024-02-07 NOTE — Progress Notes (Signed)
" °  Transition of Care [facility]) Screening Note   Patient Details  Name: Megan Mullins Date of Birth: Jun 06, 1970   Transition of Care [facility]) CM/SW Contact:    Hoy DELENA Bigness, LCSW Phone Number: 02/07/2024, 8:46 AM    Transition of Care Department [facility]) has reviewed patient and no TOC needs have been identified at this time. We will continue to monitor patient advancement through interdisciplinary progression rounds. If new patient transition needs arise, please place a TOC consult.    02/07/24 0843  TOC Brief Assessment  Insurance and Status Reviewed  Patient has primary care physician No (PCP list added to AVS)  Home environment has been reviewed From home  Prior level of function: Independent  Prior/Current Home Services No current home services  Social Drivers of Health Review SDOH reviewed no interventions necessary  Readmission risk has been reviewed Yes  Transition of care needs no transition of care needs at this time    "

## 2024-02-07 NOTE — Op Note (Signed)
 [facility] Operative Note  02/07/24  Preoperative Diagnosis: Biliary dyskinesia, intractable abdominal pain   Postoperative Diagnosis: Same   Procedure(s) Performed: Robotic Assisted Laparoscopic Cholecystectomy   Surgeon: Dorothyann Brittle, DO   Assistants: Jayson Ness, MS3    Anesthesia: General endotracheal   Anesthesiologist: Dr. Herschell    Specimens: Gallbladder   Estimated Blood Loss: Minimal   Blood Replacement: None    Complications: None   Wound Class: Clean contaminated   Operative Indications: The patient was admitted to the hospital with concern for acute cholecystitis on ultrasound with abdominal pain.  She subsequently underwent a HIDA scan which demonstrated no evidence of acute cholecystitis, but concern for biliary dyskinesia.  With her intractable abdominal pain and imaging findings, decision was made to reseed to the operating room for cholecystectomy.  We discussed the risk of the procedure including but not limited to bleeding, infection, injury to the common bile duct, bile leak, need for further procedures, chance of subtotal cholecystectomy.   Findings:  Edematous gallbladder without other inflammatory changes Critical view of safety noted All clips intact at the end of the case Adequate hemostasis   Procedure: Firefly was given in the preoperative area. The patient was taken to the operating room and placed supine. General endotracheal anesthesia was induced. Intravenous antibiotics were administered per protocol.  An orogastric tube positioned to decompress the stomach. The abdomen was prepared and draped in the usual sterile fashion. A time-out was completed verifying correct patient, procedure, site, positioning, and implant(s) and/or special equipment prior to beginning this procedure.  Veress needle was placed at the infraumbilical area and insufflation was started after confirming a positive saline drop test and no  immediate increase in abdominal pressure.  After reaching 15 mm, the Veress needle was removed and a 8 mm port was placed via optiview technique infraumbilical, measuring 20 mm away from the suspected position of the gallbladder.  The abdomen was inspected and no abnormalities or injuries were found.  Under direct vision, ports were placed in the following locations in a semi curvilinear position around the target of the gallbladder: Two 8 mm ports on the patient's right each having 8cm clearance to the adjacent ports and one 8 mm port placed on the patient's left 8 cm from the umbilical port. Once ports were placed, the table was placed in the reverse Trendelenburg position with the right side up. The Xi platform was brought into the operative field and docked to the ports successfully.  An endoscope was placed through the umbilical port, prograsp through the most lateral right port, fenestrated bipolar to the port just right of the umbilicus, and then a hook cautery in the left port.  The dome of the gallbladder was grasped with prograsp and retracted over the dome of the liver. Adhesions between the gallbladder and omentum, duodenum and transverse colon were lysed via hook cautery. The infundibulum was grasped with the fenestrated grasper and retracted toward the right lower quadrant. This maneuver exposed Calots triangle. Firefly was used throughout the dissection to ensure safe visualization of the cystic duct.  The peritoneum overlying the gallbladder infundibulum was then dissected and the cystic duct and cystic artery identified.  Critical view of safety with the liver bed clearly visible behind the duct and artery with no additional structures noted.  The cystic duct and cystic artery were doubly clipped and divided close to the gallbladder.    The gallbladder was then dissected from its peritoneal and liver bed attachments by electrocautery.  Hemostasis was checked prior to removing the hook cautery.   The Anton was undocked and moved out of the field.  A 5mm Endo Catch bag was then placed through the umbilical port and the gallbladder was removed.  The gallbladder was passed off the table as a specimen. There was no evidence of bleeding from the gallbladder fossa or cystic artery or leakage of the bile from the cystic duct stump. The umbilical port site closed with a 0 vicryl with a PMI needle.  The abdomen was desufflated and secondary trocars were removed under direct vision. No bleeding was noted. Incisions were localized with marcaine .  All skin incisions were closed with subcuticular sutures of 4-0 monocryl and dermabond.   Final inspection revealed acceptable hemostasis. All counts were correct at the end of the case. The patient was awakened from anesthesia and extubated without complication. The OG tube was removed.  The patient went to the PACU in stable condition.   Dorothyann Brittle, DO [facility] [address] [place], [place] [address] [phone] (office)

## 2024-02-07 NOTE — Progress Notes (Signed)
 Waiting on ICU bed to be ready.  Called report about 1430.  They state readiness will probably be another 20-30 minutes.   Dr. Herschell, anesthesia, at the bedside in PACU, assessed patient, okay with stability at this time.

## 2024-02-07 NOTE — Discharge Instructions (Addendum)
 Providers Accepting New Patients in [place], [place]    [facility] [address]  [place], [place] [address] [phone] Accepts most insurances  [facility] [address] [place], [place] [address] [phone] Accepts most insurances  [facility] [address] [place], [place] [address]  [phone] Must meet requirements  [facility] [address] [place], [place] [address] [phone] Accepts most insurances  [facility] [address]  [place], [place] [address] [phone] Accepts most insurances  [facility] [address]   [place], [place]   [phone] Accepts most insurances  [facility])  [phone] [address]  [place], [place] [address] [phone] Accepts most insurances     [facility] [address]  [place], [place] [address] [phone] Accepts most insurances  [facility] [address] [place], [place] [address] [phone] option 1 Accepts Medicaid and [facility] [address]  [place], [place] [address] [phone] Accepts most insurances  Benita Outhouse, MD [address] [place], [place] [address] [phone] Accepts most insurances  [facility] at [facility] [address]  [place], [place] [address] [phone] Accepts most insurances  [facility] [phone] [address] [place], [place] [address] [phone] Accepts most insurances  [place] [place] [address] [place], [place] [address] [phone]  Accepts most insurances  Ambulatory Surgery Discharge Instructions  Activity  You are advised to go directly home from the hospital.  Restrict your activities and rest for a day.  Resume light activity tomorrow. No heavy lifting over 10 lbs or strenuous exercise.  Fluids and  Diet Low-fat diet  Medications  If you have not had a bowel movement in 24 hours, take 2 tablespoons over the counter Milk of mag.             You May resume your blood thinners tomorrow (Aspirin, coumadin, or other).  You are being discharged with prescriptions for Opioid/Narcotic Medications: There are some specific considerations for these medications that you should know. Opioid Meds have risks & benefits. Addiction to these meds is always a concern with prolonged use Take medication only as directed Do not drive while taking narcotic pain medication Do not crush tablets or capsules Do not use a different container than medication was dispensed in Lock the container of medication in a cool, dry place out of reach of children and pets. Opioid medication can cause addiction Do not share with anyone else (this is a felony) Do not store medications for future use. Dispose of them properly.     Disposal:  Find a [place]  household drug take back site near you.  If you can't get to a drug take back site, use the recipe below as a last resort to dispose of expired, unused or unwanted drugs. Disposal  (Do not dispose chemotherapy drugs this way, talk to your prescribing doctor instead.) Step 1: Mix drugs (do not crush) with dirt, kitty litter, or used coffee grounds and add a small amount of water  to dissolve any solid medications. Step 2: Seal drugs in plastic bag. Step 3: Place plastic bag in trash. Step 4: Take prescription container and scratch out personal information, then recycle or throw away.  Operative Site  You have a liquid bandage over your incisions, this will begin to flake off in about a week. Ok  to shower tomorrow. Keep wound clean and dry. No baths or swimming. No lifting more than 10 pounds.  Contact Information: If you have questions or concerns, please call our office, [phone], Monday- Thursday 8AM-5PM and Friday 8AM-12Noon.  If it is after hours or on the  weekend, please call [facility], [phone], and ask to speak to the surgeon on call for Dr. Evonnie at [facility].   SPECIFIC COMPLICATIONS TO WATCH FOR: Inability to urinate Fever over 101? F by mouth Nausea and vomiting lasting longer than 24 hours. Pain not relieved by medication ordered Swelling around the operative site Increased redness, warmth, hardness, around operative area Numbness, tingling, or cold fingers or toes Blood -soaked dressing, (small amounts of oozing may be normal) Increasing and progressive drainage from surgical area or exam site

## 2024-02-07 NOTE — Anesthesia Procedure Notes (Deleted)
 Date/Time: 02/07/2024 12:04 PM  Performed by: Elaine Delon CROME, CRNA

## 2024-02-07 NOTE — Anesthesia Procedure Notes (Addendum)
 Procedure Name: Intubation Date/Time: 02/07/2024 12:24 PM  Performed by: Elaine Delon CROME, CRNAPre-anesthesia Checklist: Patient identified, Emergency Drugs available, Suction available and Patient being monitored Patient Re-evaluated:Patient Re-evaluated prior to induction Oxygen  Delivery Method: Circle system utilized Preoxygenation: Pre-oxygenation with 100% oxygen  Induction Type: IV induction Ventilation: Mask ventilation without difficulty Laryngoscope Size: Mac and 3 Grade View: Grade I Tube type: Oral Tube size: 7.0 mm Number of attempts: 1 Airway Equipment and Method: Stylet Placement Confirmation: ETT inserted through vocal cords under direct vision, positive ETCO2 and breath sounds checked- equal and bilateral Secured at: 21 cm Tube secured with: Tape Dental Injury: Teeth and Oropharynx as per pre-operative assessment

## 2024-02-07 NOTE — Progress Notes (Signed)
 ICU called and states bed is ready. Transporting to ICU.

## 2024-02-07 NOTE — Anesthesia Preprocedure Evaluation (Addendum)
"                                    Anesthesia Evaluation  Patient identified by MRN, date of birth, ID band Patient awake    Reviewed: Allergy & Precautions, H&P , NPO status , Patient's Chart, lab work & pertinent test results  Airway Mallampati: II  TM Distance: >3 FB Neck ROM: Full    Dental  (+) Missing, Poor Dentition   Pulmonary Current Smoker and Patient abstained from smoking.   Pulmonary exam normal breath sounds clear to auscultation       Cardiovascular hypertension, Normal cardiovascular exam Rhythm:Regular Rate:Normal  Bp elevated; did not get amlodipine  this am Labatelol 5 mg given preop   Neuro/Psych  PSYCHIATRIC DISORDERS      negative neurological ROS     GI/Hepatic negative GI ROS,,,(+)     substance abuse  alcohol useHx acute hepatic failure   Endo/Other  diabetes, Poorly Controlled, Insulin  Dependent    Renal/GU Renal hypertensionRenal diseaseSatge 3b  negative genitourinary   Musculoskeletal negative musculoskeletal ROS (+)    Abdominal   Peds negative pediatric ROS (+)  Hematology  (+) Blood dyscrasia, anemia   Anesthesia Other Findings Large bump on right eyelid; patient states that it has been there for about one month     Reproductive/Obstetrics negative OB ROS                              Anesthesia Physical Anesthesia Plan  ASA: 4 and emergent  Anesthesia Plan: General   Post-op Pain Management:    Induction: Intravenous  PONV Risk Score and Plan:   Airway Management Planned: Oral ETT  Additional Equipment:   Intra-op Plan:   Post-operative Plan: Extubation in OR  Informed Consent: I have reviewed the patients History and Physical, chart, labs and discussed the procedure including the risks, benefits and alternatives for the proposed anesthesia with the patient or authorized representative who has indicated his/her understanding and acceptance.     Dental advisory  given  Plan Discussed with: CRNA  Anesthesia Plan Comments:          Anesthesia Quick Evaluation  "

## 2024-02-07 NOTE — Plan of Care (Signed)
" °  Problem: Activity: Goal: Risk for activity intolerance will decrease Outcome: Progressing   Problem: Coping: Goal: Level of anxiety will decrease Outcome: Progressing   Problem: Skin Integrity: Goal: Risk for impaired skin integrity will decrease Outcome: Progressing   Problem: Metabolic: Goal: Ability to maintain appropriate glucose levels will improve Outcome: Progressing   "

## 2024-02-07 NOTE — Transfer of Care (Signed)
 Immediate Anesthesia Transfer of Care Note  Patient: Megan Mullins  Procedure(s) Performed: CHOLECYSTECTOMY, ROBOT-ASSISTED, LAPAROSCOPIC (Abdomen)  Patient Location: PACU  Anesthesia Type:General  Level of Consciousness: drowsy  Airway & Oxygen  Therapy: Patient Spontanous Breathing and Patient connected to face mask oxygen   Post-op Assessment: Report given to RN and Post -op Vital signs reviewed and stable  Post vital signs: Reviewed and stable  Last Vitals:  Vitals Value Taken Time  BP 133/100 02/07/24 13:51  Temp 97.1   Pulse 79 02/07/24 13:54  Resp 35 02/07/24 13:54  SpO2 99 % 02/07/24 13:54  Vitals shown include unfiled device data.  Last Pain:  Vitals:   02/07/24 1140  TempSrc: Oral  PainSc: 8       Patients Stated Pain Goal: 4 (02/07/24 1140)  Complications: No notable events documented.

## 2024-02-07 NOTE — Progress Notes (Signed)
 Messaged about CBG low.  Have coke and rechecked and was still low.  MD changed D5 rate back to 65ml/hr.

## 2024-02-07 NOTE — Anesthesia Procedure Notes (Signed)
 Arterial Line Insertion Start/End1/22/2026 12:57 PM, 02/07/2024 12:58 PM Performed by: Herschell Hollering, MD, anesthesiologist  Patient location: OR. Emergency situation Patient sedated Left, radial was placed Catheter size: 20 G Hand hygiene performed  and maximum sterile barriers used   Attempts: 1 Following insertion, Biopatch and dressing applied. Post procedure assessment: normal

## 2024-02-07 NOTE — Inpatient Diabetes Management (Signed)
 Inpatient Diabetes Program Recommendations  AACE/ADA: New Consensus Statement on Inpatient Glycemic Control (2015)  Target Ranges:  Prepandial:   less than 140 mg/dL      Peak postprandial:   less than 180 mg/dL (1-2 hours)      Critically ill patients:  140 - 180 mg/dL   Lab Results  Component Value Date   GLUCAP 141 (H) 02/07/2024   HGBA1C >15.5 (H) 11/26/2023    Latest Reference Range & Units 02/06/24 21:34 02/06/24 23:16 02/07/24 02:57 02/07/24 03:31 02/07/24 06:19 02/07/24 07:08  Glucose-Capillary 70 - 99 mg/dL 824 (H) 852 (H) 35 (LL) 130 (H) 45 (L) 141 (H)  (LL): Data is critically low (H): Data is abnormally high (L): Data is abnormally low  Diabetes history: DM2 Outpatient Diabetes medications:  Tesiba 8-15 units hs Novolog  8 units tid meal coverage Current orders for Inpatient glycemic control: Semglee  10 units daily Novolog  0-9 units tid, 0-5 units hs  Inpatient Diabetes Program Recommendations:   Fasting CBG 45. Please consider: -Decrease Semglee  to 6 units daily -Decrease Novolog  correction to 0-6 units q 6 hrs.  Thank you, Jerrilynn Mikowski E. Kahlen Boyde, RN, MSN, CNS, CDCES  Diabetes Coordinator Inpatient Glycemic Control Team Team Pager [phone] (8am-5pm) 02/07/2024 9:34 AM

## 2024-02-07 NOTE — Progress Notes (Addendum)
 Bump on right eyelid, About the size of a grape.

## 2024-02-07 NOTE — Anesthesia Postprocedure Evaluation (Signed)
"   Anesthesia Post Note  Patient: LOYCE FLAMING  Procedure(s) Performed: CHOLECYSTECTOMY, ROBOT-ASSISTED, LAPAROSCOPIC (Abdomen)  Patient location during evaluation: PACU Anesthesia Type: General Level of consciousness: sedated Pain management: pain level controlled Vital Signs Assessment: post-procedure vital signs reviewed and stable Respiratory status: spontaneous breathing, nonlabored ventilation, respiratory function stable and patient connected to nasal cannula oxygen  Cardiovascular status: blood pressure returned to baseline and stable Postop Assessment: no apparent nausea or vomiting Anesthetic complications: no Comments: Based on OR events patient to ICU after PACU in stable condition CXR shows cardiomegaly and vascular congestion   No notable events documented.   Last Vitals:  Vitals:   02/07/24 1700 02/07/24 1723  BP: (!) 117/94   Pulse: 78 76  Resp: 15 17  Temp:    SpO2: (!) 88% 92%    Last Pain:  Vitals:   02/07/24 1723  TempSrc:   PainSc: 4                  Andrea Limes      "

## 2024-02-07 NOTE — Consult Note (Addendum)
 [facility] Consult  Reason for Consult: Concern for acute cholecystitis Referring Physician: Thersia Salt, PA-C  Chief Complaint   Back Pain; Flank Pain     HPI: Megan Mullins is a 54 y.o. female who presented to the hospital with a 1 day history of right-sided abdominal pain and right back pain.  She denies ever having any episodes like this previously.  She denies any associated nausea or vomiting.  Her past medical history is significant for diabetes.  Her surgical history is significant for debridement of a right groin necrotizing soft tissue infection and tubal ligation.  In the ED, she was noted to be hemodynamically stable.  She has chronic elevation in her alkaline phosphatase, but otherwise has normal LFTs.  She has no leukocytosis.  She underwent a CT of the abdomen and pelvis which demonstrated fatty liver with possible hepatitis, diffuse gallbladder wall thickening versus pericholecystic edema which could be related to liver disease versus gallbladder pathology.  She underwent an abdominal ultrasound which demonstrated no gallstones but gallbladder wall thickening and positive sonographic Murphy's which could possibly to be due to underlying liver disease versus acalculous cholecystitis.  She subsequently underwent a HIDA scan which demonstrates patent cystic and common duct without evidence of acute cholecystitis and a decreased gallbladder ejection fraction of 15%.  Her abdominal pain significantly worsened after getting CCK for the HIDA scan.  Past Medical History:  Diagnosis Date   Diabetes mellitus without complication (HCC)     Past Surgical History:  Procedure Laterality Date   IRRIGATION AND DEBRIDEMENT OF NECROTIZING SOFT TISSUE INFECTION Right 04/23/2023   Procedure: IRRIGATION, DRAINAGE AND DEBRIDEMENT OF NECROTIZING SOFT TISSUE INFECTION RIGHT GROIN DOWN TO MUSCLE, 11CM X 12CM X 5CM;  Surgeon: Evonnie Dorothyann LABOR, DO;  Location: [facility];  Service:  General;  Laterality: Right;   TUBAL LIGATION      Family History  Problem Relation Age of Onset   Heart disease Mother    Diabetes Mother     Social History[1]  Medications: I have reviewed the patient's current medications.  Allergies[2]   ROS:  Pertinent items are noted in HPI.  Blood pressure (!) 153/89, pulse 92, temperature 97.8 F (36.6 C), temperature source Oral, resp. rate 18, height 5' (1.524 m), weight 80.7 kg, last menstrual period 07/18/2021, SpO2 94%. Physical Exam Vitals reviewed.  Constitutional:      Appearance: Normal appearance.  HENT:     Head: Normocephalic and atraumatic.  Eyes:     Extraocular Movements: Extraocular movements intact.     Pupils: Pupils are equal, round, and reactive to light.  Cardiovascular:     Rate and Rhythm: Normal rate.  Pulmonary:     Effort: Pulmonary effort is normal.  Abdominal:     Comments: Abdomen soft, nondistended, no percussion tenderness, mild right upper quadrant tenderness to palpation; no rigidity, guarding, rebound tenderness; negative Murphy sign  Musculoskeletal:        General: Normal range of motion.     Cervical back: Normal range of motion.  Skin:    General: Skin is warm and dry.  Neurological:     General: No focal deficit present.     Mental Status: She is alert and oriented to person, place, and time.  Psychiatric:        Mood and Affect: Mood normal.        Behavior: Behavior normal.     Results: Results for orders placed or performed during the hospital encounter of 02/06/24 (  from the past 48 hours)  Comprehensive metabolic panel     Status: Abnormal   Collection Time: 02/06/24  1:43 PM  Result Value Ref Range   Sodium 139 135 - 145 mmol/L   Potassium 4.2 3.5 - 5.1 mmol/L   Chloride 103 98 - 111 mmol/L   CO2 21 (L) 22 - 32 mmol/L   Glucose, Bld 116 (H) 70 - 99 mg/dL    Comment: Glucose reference range applies only to samples taken after fasting for at least 8 hours.   BUN 27 (H) 6 -  20 mg/dL   Creatinine, Ser 8.08 (H) 0.44 - 1.00 mg/dL   Calcium  9.3 8.9 - 10.3 mg/dL   Total Protein 7.1 6.5 - 8.1 g/dL   Albumin 3.5 3.5 - 5.0 g/dL   AST 42 (H) 15 - 41 U/L   ALT 36 0 - 44 U/L   Alkaline Phosphatase 214 (H) 38 - 126 U/L   Total Bilirubin 0.7 0.0 - 1.2 mg/dL   GFR, Estimated 31 (L) >60 mL/min    Comment: (NOTE) Calculated using the CKD-EPI Creatinine Equation (2021)    Anion gap 14 5 - 15    Comment: Performed at [facility], [address] [place], [place] [address]  CBC with Differential     Status: Abnormal   Collection Time: 02/06/24  1:43 PM  Result Value Ref Range   WBC 5.3 4.0 - 10.5 K/uL   RBC 4.23 3.87 - 5.11 MIL/uL   Hemoglobin 13.7 12.0 - 15.0 g/dL   HCT 58.5 63.9 - 53.9 %   MCV 97.9 80.0 - 100.0 fL   MCH 32.4 26.0 - 34.0 pg   MCHC 33.1 30.0 - 36.0 g/dL   RDW 84.3 (H) 88.4 - 84.4 %   Platelets 383 150 - 400 K/uL   nRBC 0.0 0.0 - 0.2 %   Neutrophils Relative % 65 %   Neutro Abs 3.4 1.7 - 7.7 K/uL   Lymphocytes Relative 30 %   Lymphs Abs 1.6 0.7 - 4.0 K/uL   Monocytes Relative 4 %   Monocytes Absolute 0.2 0.1 - 1.0 K/uL   Eosinophils Relative 0 %   Eosinophils Absolute 0.0 0.0 - 0.5 K/uL   Basophils Relative 1 %   Basophils Absolute 0.0 0.0 - 0.1 K/uL   Immature Granulocytes 0 %   Abs Immature Granulocytes 0.01 0.00 - 0.07 K/uL    Comment: Performed at [facility], [address] [place], [place] [address]  Lipase, blood     Status: Abnormal   Collection Time: 02/06/24  1:43 PM  Result Value Ref Range   Lipase <10 (L) 11 - 51 U/L    Comment: Performed at [facility], [address], [place], [place] [address]  Protime-INR     Status: None   Collection Time: 02/06/24  1:43 PM  Result Value Ref Range   Prothrombin Time 13.1 11.4 - 15.2 seconds   INR 0.9 0.8 - 1.2    Comment: (NOTE) INR goal varies based on device and disease states. Performed at [facility], [address], [place], [place] [address]   Glucose, capillary     Status:  Abnormal   Collection Time: 02/06/24  9:34 PM  Result Value Ref Range   Glucose-Capillary 175 (H) 70 - 99 mg/dL    Comment: Glucose reference range applies only to samples taken after fasting for at least 8 hours.  Glucose, capillary     Status: Abnormal   Collection Time: 02/06/24  11:16 PM  Result Value Ref Range   Glucose-Capillary 147 (H) 70 - 99 mg/dL    Comment: Glucose reference range applies only to samples taken after fasting for at least 8 hours.  Glucose, capillary     Status: Abnormal   Collection Time: 02/07/24  2:57 AM  Result Value Ref Range   Glucose-Capillary 35 (LL) 70 - 99 mg/dL    Comment: Glucose reference range applies only to samples taken after fasting for at least 8 hours.   Comment 1 Notify RN   Glucose, capillary     Status: Abnormal   Collection Time: 02/07/24  3:31 AM  Result Value Ref Range   Glucose-Capillary 130 (H) 70 - 99 mg/dL    Comment: Glucose reference range applies only to samples taken after fasting for at least 8 hours.  Comprehensive metabolic panel     Status: Abnormal   Collection Time: 02/07/24  3:38 AM  Result Value Ref Range   Sodium 141 135 - 145 mmol/L   Potassium 3.9 3.5 - 5.1 mmol/L   Chloride 106 98 - 111 mmol/L   CO2 22 22 - 32 mmol/L   Glucose, Bld 104 (H) 70 - 99 mg/dL    Comment: Glucose reference range applies only to samples taken after fasting for at least 8 hours.   BUN 26 (H) 6 - 20 mg/dL   Creatinine, Ser 7.90 (H) 0.44 - 1.00 mg/dL   Calcium  8.7 (L) 8.9 - 10.3 mg/dL   Total Protein 6.3 (L) 6.5 - 8.1 g/dL   Albumin 3.2 (L) 3.5 - 5.0 g/dL   AST 33 15 - 41 U/L   ALT 33 0 - 44 U/L   Alkaline Phosphatase 180 (H) 38 - 126 U/L   Total Bilirubin 0.4 0.0 - 1.2 mg/dL   GFR, Estimated 28 (L) >60 mL/min    Comment: (NOTE) Calculated using the CKD-EPI Creatinine Equation (2021)    Anion gap 14 5 - 15    Comment: Performed at [facility], [address], [place], [place] [address]  Hepatitis panel, acute     Status: None    Collection Time: 02/07/24  3:38 AM  Result Value Ref Range   Hepatitis B Surface Ag NON REACTIVE NON REACTIVE   HCV Ab NON REACTIVE NON REACTIVE    Comment: (NOTE) Nonreactive HCV antibody screen is consistent with no HCV infections,  unless recent infection is suspected or other evidence exists to indicate HCV infection.     Hep A IgM NON REACTIVE NON REACTIVE   Hep B C IgM NON REACTIVE NON REACTIVE    Comment: Performed at [facility], [address] [place], [place] [address]  Lipid panel     Status: None   Collection Time: 02/07/24  3:38 AM  Result Value Ref Range   Cholesterol 127 0 - 200 mg/dL    Comment:        ATP III CLASSIFICATION:  <200     mg/dL   Desirable  799-760  mg/dL   Borderline High  >=759    mg/dL   High           Triglycerides 94 <150 mg/dL   HDL 67 >59 mg/dL   Total CHOL/HDL Ratio 1.9 RATIO   VLDL 19 0 - 40 mg/dL   LDL Cholesterol 41 0 - 99 mg/dL    Comment:        Total Cholesterol/HDL:CHD Risk Coronary Heart Disease Risk Table  Men   Women  1/2 Average Risk   3.4   3.3  Average Risk       5.0   4.4  2 X Average Risk   9.6   7.1  3 X Average Risk  23.4   11.0        Use the calculated Patient Ratio above and the CHD Risk Table to determine the patient's CHD Risk.        ATP III CLASSIFICATION (LDL):  <100     mg/dL   Optimal  899-870  mg/dL   Near or Above                    Optimal  130-159  mg/dL   Borderline  839-810  mg/dL   High  >809     mg/dL   Very High Performed at [facility], [address] [place], [place] [address]   Glucose, capillary     Status: Abnormal   Collection Time: 02/07/24  6:19 AM  Result Value Ref Range   Glucose-Capillary 45 (L) 70 - 99 mg/dL    Comment: Glucose reference range applies only to samples taken after fasting for at least 8 hours.  Glucose, capillary     Status: Abnormal   Collection Time: 02/07/24  7:08 AM  Result Value Ref Range   Glucose-Capillary 141 (H) 70 - 99  mg/dL    Comment: Glucose reference range applies only to samples taken after fasting for at least 8 hours.  Glucose, capillary     Status: Abnormal   Collection Time: 02/07/24 10:30 AM  Result Value Ref Range   Glucose-Capillary 50 (L) 70 - 99 mg/dL    Comment: Glucose reference range applies only to samples taken after fasting for at least 8 hours.  Glucose, capillary     Status: Abnormal   Collection Time: 02/07/24 11:03 AM  Result Value Ref Range   Glucose-Capillary 146 (H) 70 - 99 mg/dL    Comment: Glucose reference range applies only to samples taken after fasting for at least 8 hours.    NM Hepato W/EF Result Date: 02/07/2024 CLINICAL DATA:  Right-sided abdominal pain for 1 day. Clinical concern for acalculous cholecystitis. EXAM: NUCLEAR MEDICINE HEPATOBILIARY IMAGING WITH GALLBLADDER EF TECHNIQUE: Sequential images of the abdomen were obtained out to 60 minutes following intravenous administration of radiopharmaceutical. After slow intravenous infusion of 1.6 micrograms Cholecystokinin, gallbladder ejection fraction was determined. RADIOPHARMACEUTICALS:  5.1 mCi Tc-19m Choletec  IV COMPARISON:  Abdominal CT and ultrasound 02/06/2024. FINDINGS: Prompt uptake and biliary excretion of activity by the liver is seen. Gallbladder activity is visualized, consistent with patency of cystic duct. Biliary activity passes into small bowel, consistent with patent common bile duct. Calculated gallbladder ejection fraction is 15%. (At 60 min, normal ejection fraction is greater than 40% and less than 80%.) IMPRESSION: 1. The cystic and common bile ducts are patent. No evidence of acute cholecystitis. 2. 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 Electronically Signed   By: Elsie Perone M.D.   On: 02/07/2024 10:48   US  Abdomen Limited RUQ (LIVER/GB) Result Date: 02/06/2024 CLINICAL DATA:  Right upper quadrant abdominal pain. EXAM: ULTRASOUND  ABDOMEN LIMITED RIGHT UPPER QUADRANT COMPARISON:  CT abdomen pelvis dated 02/06/2024. FINDINGS: Gallbladder: No gallstone. The gallbladder wall is thickened and edematous measuring up to 13 mm. Positive sonographic Murphy's sign is reported. Common bile duct: Diameter: 4 mm Liver: There is diffuse increased liver echogenicity most commonly seen  in the setting of fatty infiltration. Superimposed inflammation or fibrosis is not excluded. Clinical correlation is recommended. Portal vein is patent on color Doppler imaging with normal direction of blood flow towards the liver. Other: None. IMPRESSION: 1. No gallstone. Thickened gallbladder wall with reported sonographic Murphy's sign. Although findings may be related to underlying liver disease, an acalculous cholecystitis cannot be excluded. A hepatobiliary scintigraphy may provide better evaluation of the gallbladder if there is a high clinical concern for acute cholecystitis . 2. Fatty liver. Electronically Signed   By: Vanetta Chou M.D.   On: 02/06/2024 16:19   CT ABDOMEN PELVIS W CONTRAST Result Date: 02/06/2024 CLINICAL DATA:  Right-sided abdominal pain. EXAM: CT ABDOMEN AND PELVIS WITH CONTRAST TECHNIQUE: Multidetector CT imaging of the abdomen and pelvis was performed using the standard protocol following bolus administration of intravenous contrast. RADIATION DOSE REDUCTION: This exam was performed according to the departmental dose-optimization program which includes automated exposure control, adjustment of the mA and/or kV according to patient size and/or use of iterative reconstruction technique. CONTRAST:  80mL OMNIPAQUE  IOHEXOL  300 MG/ML  SOLN COMPARISON:  CT abdomen pelvis dated 07/28/2021. FINDINGS: Lower chest: The visualized lung bases are clear. No intra-abdominal free air or free fluid. Hepatobiliary: Fatty appearing liver. Slight heterogeneous enhancement of the liver parenchyma may represent hepatitis. Clinical correlation recommended. No  biliary dilatation. No calcified gallstone. There is diffuse gallbladder wall thickening versus pericholecystic edema which may be related to underlying liver disease. Ultrasound may provide better evaluation if there is clinical concern for acute gallbladder pathology. Pancreas: Unremarkable. No pancreatic ductal dilatation or surrounding inflammatory changes. Spleen: Normal in size without focal abnormality. Adrenals/Urinary Tract: The right adrenal glands unremarkable. Mild left adrenal thickening/hyperplasia or adenoma. There is mild bilateral renal parenchyma atrophy. There is no hydronephrosis on either side. There is symmetric enhancement and excretion of contrast by both kidneys. The visualized ureters and urinary bladder appear unremarkable. Stomach/Bowel: There is sigmoid diverticulosis. There is no bowel obstruction or active inflammation. The appendix is normal. Vascular/Lymphatic: Mild aortoiliac atherosclerotic disease. The IVC is unremarkable. No portal venous gas. There is no adenopathy. Reproductive: The uterus is anteverted. No suspicious adnexal masses. Other: Mild diffuse subcutaneous edema Musculoskeletal: Severe arthritic changes of the hips. No acute osseous pathology. IMPRESSION: 1. Fatty liver with possible hepatitis. Clinical correlation is recommended. 2. Diffuse gallbladder wall thickening versus pericholecystic edema which may be related to underlying liver disease. Ultrasound may provide better evaluation if there is clinical concern for acute gallbladder pathology. 3. Sigmoid diverticulosis. No bowel obstruction. Normal appendix. 4.  Aortic Atherosclerosis (ICD10-I70.0). Electronically Signed   By: Vanetta Chou M.D.   On: 02/06/2024 15:46     Assessment & Plan:  Megan Mullins is a 54 y.o. female who presents with right-sided abdominal pain.  Initial imaging was equivocal for acute cholecystitis, so she was admitted for plans for HIDA scan.  Imaging and blood work evaluated by  myself.  -We discussed the pathophysiology of gallbladder disease and the recommendations for cholecystectomy.  I explained that she does not have acute cholecystitis, but given that she has persistent pain after receiving CCK during HIDA, she likely has biliary dyskinesia -I counseled the patient about the indication, risks and benefits of robotic assisted laparoscopic cholecystectomy.  She understands there is a very small chance for bleeding, infection, injury to normal structures (including common bile duct), conversion to open surgery, persistent symptoms, evolution of postcholecystectomy diarrhea, need for secondary interventions, anesthesia reaction, cardiopulmonary issues and other risks  not specifically detailed here. I described the expected recovery, the plan for follow-up and the restrictions during the recovery phase.  All questions were answered. -Patient has decided to proceed with surgery today over discharge and outpatient follow-up given her intractable abdominal pain -NPO -IVF -Prophylactic Cefotan  ordered.  ICG also ordered -Further recommendations to follow surgery -Appreciate hospitalist recommendations  All questions were answered to the satisfaction of the patient.  Note: Portions of this report may have been transcribed using voice recognition software. Every effort has been made to ensure accuracy; however, inadvertent computerized transcription errors may still be present.   -- Dorothyann Brittle, DO [facility] [address] [place], [place] [address] [phone] (office)       [1]  Social History Tobacco Use   Smoking status: Every Day    Current packs/day: 0.50    Types: Cigarettes   Smokeless tobacco: Never  Vaping Use   Vaping status: Never Used  Substance Use Topics   Alcohol use: Not Currently    Comment: haven't drank in 2xmos   Drug use: Not Currently    Types: Cocaine    Comment: last used today  [2] No  Known Allergies

## 2024-02-07 NOTE — Progress Notes (Signed)
 " PROGRESS NOTE  Megan Mullins, is a 54 y.o. female, DOB - 01-22-1970, FMW:[id number]  Admit date - 02/06/2024   Admitting Physician Ejiroghene FORBES Carwin, MD  Outpatient Primary MD for the Megan Mullins is Megan Mullins, No Pcp Per  LOS - 0  Chief Complaint  Megan Mullins presents with   Back Pain   Flank Pain      Brief Narrative:  54 y.o. female with medical history significant for hypertension, diabetes mellitus, CKD 3b admitted on 02/06/2024 with concerns for  Biliary dyskinesia  and possible acalculous cholecystitis. - Megan Mullins underwent lap chole on 02/05/2024 developed acute hypoxic respiratory failure postoperatively    -Assessment and Plan: 1) Biliary dyskinesia/intractable abdominal pain--- -CT abdomen and pelvis, right upper quadrant ultrasound and HIDA scan noted  status post lap chole -- Postoperative management per surgical team  2)Acute hypoxic respiratory failure----chest x-ray clinical exam suggest possible volume overload/pulmonary venous congestion -- Give Lasix  as ordered -Cannot rule out concomitant/underlying pneumonia so treat empirically for now with Rocephin /azithromycin  pending further data - Daily weight and fluid input and output monitor - Check echo - Rule out possible PE--- Lower extremity venous Dopplers are negative - Unable to do VQ scan until tomorrow due to HIDA scan that was done earlier today -- Avoid CTA chest due to poor renal function -- No need for therapeutic heparin  given postoperative state with higher risk for bleeding --- Hypoxia resolving Megan Mullins actually is currently on room air at this time -- As needed bronchodilators  3)OSA/Class 2 obesity--- Megan Mullins clearly has obstructive sleep apnea -- She desaturates into the 70s when she falls asleep - Use BiPAP nightly - She will need outpatient sleep study postdischarge -Low calorie diet, portion control and increase physical activity discussed with Megan Mullins after acute medical problems resolved -Body mass  index is 35.31 kg/m.  4)Fatty Liver--AST/ALT unremarkable - Alk phos elevated T. bili WNL -- Fasting lipid profile unremarkable with LDL of 41 triglycerides of 94 HDL of 67, viral hepatitis profile negative -- diet and lifestyle modifications as outlined in #3 above  5)DM2 --A1c greater than 15.5 consistent with uncontrolled DM with hyperglycemia - Resume insulin  therapy--follow-up with PCP for further adjustments of her regimen Use Novolog /Humalog Sliding scale insulin  with Accu-Cheks/Fingersticks as ordered   6)CKD stage -3B   -renal function currently close to baseline -- renally adjust medications, avoid nephrotoxic agents / dehydration  / hypotension  7)HTN--stable, continue amlodipine  5 mg daily -Hold losartan /HCTZ for now while on IV Lasix  - Monitor BPs closely while on IV Lasix   Status is: Inpatient   Disposition: The Megan Mullins is from: Home              Anticipated d/c is to: Home              Anticipated d/c date is: 1 day              Megan Mullins currently is not medically stable to d/c. Barriers: Not Clinically Stable-   Code Status :  -  Code Status: Full Code   Family Communication:    NA (Megan Mullins is alert, awake and coherent)   DVT Prophylaxis  :   - SCDs   heparin  injection 5,000 Units Start: 02/07/24 2200   Lab Results  Component Value Date   PLT 383 02/06/2024    Inpatient Medications  Scheduled Meds:  acetaminophen   1,000 mg Oral Q6H   amLODipine   5 mg Oral Daily   atorvastatin   20 mg Oral Daily   azithromycin   500  mg Oral Daily   chlorhexidine        furosemide   40 mg Intravenous Q12H   heparin  injection (subcutaneous)  5,000 Units Subcutaneous Q8H   indocyanine green        insulin  aspart  0-5 Units Subcutaneous QHS   insulin  aspart  0-9 Units Subcutaneous TID WC   insulin  glargine  6 Units Subcutaneous QHS   ipratropium-albuterol   3 mL Nebulization Q6H   Continuous Infusions:  cefTRIAXone  (ROCEPHIN )  IV     dextrose  5 % and 0.45 % NaCl 83 mL/hr  at 02/07/24 1600   PRN Meds:.albuterol , chlorhexidine , HYDROmorphone  (DILAUDID ) injection, indocyanine green , labetalol , ondansetron  **OR** ondansetron  (ZOFRAN ) IV, oxyCODONE , polyethylene glycol   Anti-infectives (From admission, onward)    Start     Dose/Rate Route Frequency Ordered Stop   02/07/24 2000  cefTRIAXone  (ROCEPHIN ) 2 g in sodium chloride  0.9 % 100 mL IVPB        2 g 200 mL/hr over 30 Minutes Intravenous Every 24 hours 02/07/24 1510     02/07/24 1700  azithromycin  (ZITHROMAX ) tablet 500 mg        500 mg Oral Daily 02/07/24 1510 02/10/24 0959   02/07/24 1154  cefoTEtan  (CEFOTAN ) 2 g in sodium chloride  0.9 % 100 mL IVPB        2 g 200 mL/hr over 30 Minutes Intravenous On call to O.R. 02/07/24 1144 02/07/24 1520   02/07/24 1145  sodium chloride  0.9 % with cefoTEtan  (CEFOTAN ) ADS Med       Note to Pharmacy: Ulla Hurl S: cabinet override      02/07/24 1145 02/07/24 1233         Subjective: Charlies Birk today has no fevers, no further emesis,  No chest pain,   - Hypoxia resolving now on room air - Episodes of snoring and desaturation when she falls asleep consistent with sleep apnea -- No leg pains, no leg swelling No fever  Or chills   Objective: Vitals:   02/07/24 1445 02/07/24 1600 02/07/24 1700 02/07/24 1723  BP: (!) 145/106 (!) 141/102 (!) 117/94   Pulse: 84 76 78 76  Resp: 20 17 15 17   Temp:  (!) 97.4 F (36.3 C)    TempSrc:  Oral    SpO2: 100% 96% (!) 88% 92%  Weight:  82 kg    Height:  5' (1.524 m)      Intake/Output Summary (Last 24 hours) at 02/07/2024 1735 Last data filed at 02/07/2024 1600 Gross per 24 hour  Intake 2038.93 ml  Output 5 ml  Net 2033.93 ml   Filed Weights   02/06/24 1331 02/07/24 1140 02/07/24 1600  Weight: 80.7 kg 80.7 kg 82 kg    Physical Exam  Gen:- Awake Alert, no acute distress HEENT:- .AT, No sclera icterus Neck-Supple Neck,No JVD,.  Lungs-diminished in bases, no wheezing  CV- S1, S2 normal, regular  Abd-   +ve B.Sounds, Abd Soft, postoperative wounds are clean dry and intact, appropriate postoperative tenderness , increased truncal adiposity Extremity/Skin:- No  edema, negative Homans pedal pulses present  Psych-affect is appropriate, oriented x3 Neuro-no new focal deficits, no tremors  Data Reviewed: I have personally reviewed following labs and imaging studies  CBC: Recent Labs  Lab 02/06/24 1343  WBC 5.3  NEUTROABS 3.4  HGB 13.7  HCT 41.4  MCV 97.9  PLT 383   Basic Metabolic Panel: Recent Labs  Lab 02/06/24 1343 02/07/24 0338  NA 139 141  K 4.2 3.9  CL 103 106  CO2  21* 22  GLUCOSE 116* 104*  BUN 27* 26*  CREATININE 1.91* 2.09*  CALCIUM  9.3 8.7*   GFR: Estimated Creatinine Clearance: 30.2 mL/min (A) (by C-G formula based on SCr of 2.09 mg/dL (H)). Liver Function Tests: Recent Labs  Lab 02/06/24 1343 02/07/24 0338  AST 42* 33  ALT 36 33  ALKPHOS 214* 180*  BILITOT 0.7 0.4  PROT 7.1 6.3*  ALBUMIN 3.5 3.2*   Radiology Studies: US  Venous Img Lower Bilateral (DVT) Result Date: 02/07/2024 CLINICAL DATA:  Postop shortness of breath EXAM: Bilateral Lower Extremity Venous Doppler Ultrasound TECHNIQUE: Gray-scale sonography with compression, as well as color and duplex ultrasound, were performed to evaluate the deep venous system(s) from the level of the common femoral vein through the popliteal and proximal calf veins. COMPARISON:  None available FINDINGS: VENOUS Normal compressibility of the common femoral, femoral, and popliteal veins, as well as the visualized calf veins. Visualized portions of profunda femoral vein and great saphenous vein unremarkable. No filling defects to suggest DVT on grayscale or color Doppler imaging. Doppler waveforms show normal direction of venous flow, normal respiratory plasticity and response to augmentation. OTHER None. Limitations: Limited visualization of calf veins due to presence of compression devices. IMPRESSION: No lower extremity DVT.  Electronically Signed   By: Aliene Lloyd M.D.   On: 02/07/2024 16:56   DG Chest Port 1 View Result Date: 02/07/2024 CLINICAL DATA:  Hypoxia. EXAM: PORTABLE CHEST 1 VIEW COMPARISON:  Chest radiograph dated 02/20/2021. FINDINGS: There is cardiomegaly with vascular congestion. Left lung interstitial densities may represent mild congestion/edema. Atypical pneumonia is not excluded. No pleural effusion pneumothorax. No acute osseous pathology. IMPRESSION: 1. Cardiomegaly with vascular congestion. 2. Left lung interstitial densities may represent mild congestion/edema. Pneumonia is not excluded. Electronically Signed   By: Vanetta Chou M.D.   On: 02/07/2024 14:18   NM Hepato W/EF Result Date: 02/07/2024 CLINICAL DATA:  Right-sided abdominal pain for 1 day. Clinical concern for acalculous cholecystitis. EXAM: NUCLEAR MEDICINE HEPATOBILIARY IMAGING WITH GALLBLADDER EF TECHNIQUE: Sequential images of the abdomen were obtained out to 60 minutes following intravenous administration of radiopharmaceutical. After slow intravenous infusion of 1.6 micrograms Cholecystokinin, gallbladder ejection fraction was determined. RADIOPHARMACEUTICALS:  5.1 mCi Tc-30m Choletec  IV COMPARISON:  Abdominal CT and ultrasound 02/06/2024. FINDINGS: Prompt uptake and biliary excretion of activity by the liver is seen. Gallbladder activity is visualized, consistent with patency of cystic duct. Biliary activity passes into small bowel, consistent with patent common bile duct. Calculated gallbladder ejection fraction is 15%. (At 60 min, normal ejection fraction is greater than 40% and less than 80%.) IMPRESSION: 1. The cystic and common bile ducts are patent. No evidence of acute cholecystitis. 2. 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 Electronically Signed   By: Elsie Perone M.D.   On: 02/07/2024 10:48   US  Abdomen Limited RUQ (LIVER/GB) Result Date: 02/06/2024 CLINICAL DATA:   Right upper quadrant abdominal pain. EXAM: ULTRASOUND ABDOMEN LIMITED RIGHT UPPER QUADRANT COMPARISON:  CT abdomen pelvis dated 02/06/2024. FINDINGS: Gallbladder: No gallstone. The gallbladder wall is thickened and edematous measuring up to 13 mm. Positive sonographic Murphy's sign is reported. Common bile duct: Diameter: 4 mm Liver: There is diffuse increased liver echogenicity most commonly seen in the setting of fatty infiltration. Superimposed inflammation or fibrosis is not excluded. Clinical correlation is recommended. Portal vein is patent on color Doppler imaging with normal direction of blood flow towards the liver. Other: None. IMPRESSION: 1. No gallstone. Thickened gallbladder wall with  reported sonographic Murphy's sign. Although findings may be related to underlying liver disease, an acalculous cholecystitis cannot be excluded. A hepatobiliary scintigraphy may provide better evaluation of the gallbladder if there is a high clinical concern for acute cholecystitis . 2. Fatty liver. Electronically Signed   By: Vanetta Chou M.D.   On: 02/06/2024 16:19   CT ABDOMEN PELVIS W CONTRAST Result Date: 02/06/2024 CLINICAL DATA:  Right-sided abdominal pain. EXAM: CT ABDOMEN AND PELVIS WITH CONTRAST TECHNIQUE: Multidetector CT imaging of the abdomen and pelvis was performed using the standard protocol following bolus administration of intravenous contrast. RADIATION DOSE REDUCTION: This exam was performed according to the departmental dose-optimization program which includes automated exposure control, adjustment of the mA and/or kV according to Megan Mullins size and/or use of iterative reconstruction technique. CONTRAST:  80mL OMNIPAQUE  IOHEXOL  300 MG/ML  SOLN COMPARISON:  CT abdomen pelvis dated 07/28/2021. FINDINGS: Lower chest: The visualized lung bases are clear. No intra-abdominal free air or free fluid. Hepatobiliary: Fatty appearing liver. Slight heterogeneous enhancement of the liver parenchyma may  represent hepatitis. Clinical correlation recommended. No biliary dilatation. No calcified gallstone. There is diffuse gallbladder wall thickening versus pericholecystic edema which may be related to underlying liver disease. Ultrasound may provide better evaluation if there is clinical concern for acute gallbladder pathology. Pancreas: Unremarkable. No pancreatic ductal dilatation or surrounding inflammatory changes. Spleen: Normal in size without focal abnormality. Adrenals/Urinary Tract: The right adrenal glands unremarkable. Mild left adrenal thickening/hyperplasia or adenoma. There is mild bilateral renal parenchyma atrophy. There is no hydronephrosis on either side. There is symmetric enhancement and excretion of contrast by both kidneys. The visualized ureters and urinary bladder appear unremarkable. Stomach/Bowel: There is sigmoid diverticulosis. There is no bowel obstruction or active inflammation. The appendix is normal. Vascular/Lymphatic: Mild aortoiliac atherosclerotic disease. The IVC is unremarkable. No portal venous gas. There is no adenopathy. Reproductive: The uterus is anteverted. No suspicious adnexal masses. Other: Mild diffuse subcutaneous edema Musculoskeletal: Severe arthritic changes of the hips. No acute osseous pathology. IMPRESSION: 1. Fatty liver with possible hepatitis. Clinical correlation is recommended. 2. Diffuse gallbladder wall thickening versus pericholecystic edema which may be related to underlying liver disease. Ultrasound may provide better evaluation if there is clinical concern for acute gallbladder pathology. 3. Sigmoid diverticulosis. No bowel obstruction. Normal appendix. 4.  Aortic Atherosclerosis (ICD10-I70.0). Electronically Signed   By: Vanetta Chou M.D.   On: 02/06/2024 15:46   Scheduled Meds:  acetaminophen   1,000 mg Oral Q6H   amLODipine   5 mg Oral Daily   atorvastatin   20 mg Oral Daily   azithromycin   500 mg Oral Daily   chlorhexidine        furosemide    40 mg Intravenous Q12H   heparin  injection (subcutaneous)  5,000 Units Subcutaneous Q8H   indocyanine green        insulin  aspart  0-5 Units Subcutaneous QHS   insulin  aspart  0-9 Units Subcutaneous TID WC   insulin  glargine  6 Units Subcutaneous QHS   ipratropium-albuterol   3 mL Nebulization Q6H   Continuous Infusions:  cefTRIAXone  (ROCEPHIN )  IV     dextrose  5 % and 0.45 % NaCl 83 mL/hr at 02/07/24 1600    LOS: 0 days   Rendall Carwin M.D on 02/07/2024 at 5:35 PM  Go to [email or web address] - for contact info  [facility] - Office  [phone]  If 7PM-7AM, please contact night-coverage [email or web address] 02/07/2024, 5:35 PM    "

## 2024-02-07 NOTE — Progress Notes (Signed)
 Hypoglycemic Event  CBG: 45 @t  0619  Treatment: D50 25 mL (12.5 gm)  Symptoms: None  Follow-up CBG: Time:0708 CBG Result:141  Possible Reasons for Event: Unknown  Comments/MD notified: Notified Lynwood Kipper, NP. New order placed for D5 amp     Andrea Mayer, LPN

## 2024-02-07 NOTE — Progress Notes (Signed)
 Hypoglycemic Event  CBG: 35 @ 0257  Treatment: D50 50 mL (25 gm)  Symptoms: None  Follow-up CBG: Time:0317 CBG Result:130  Possible Reasons for Event: Unknown. However, pt rec'd 10 units of long acting Lantus  at 2214 per scheduled meds.  Comments/MD notified: Charge Nurse Rayfield Ned, RN and Lynwood Kipper notified.    Andrea Mayer, LPN

## 2024-02-07 NOTE — Progress Notes (Signed)
 [facility]  Spoke with the patient's son on the phone.  I explained that she tolerated the procedure, though she did have some hypoxia at one point during the case.  She has dissolvable stitches under the skin with overlying skin glue.  This will flake off in 10 to 14 days.  She will get admitted to the ICU overnight for monitoring of her respiratory status and evaluation by the hospitalist to see if if she needs any other workup.  All questions were answered to his expressed satisfaction.  Plan: -Patient to be transferred to ICU for respiratory monitoring overnight per anesthesia request -Arterial line in place.  Last ABG with improved PaO2 -Foley catheter placed at the completion of the case to assess urine output given renal insufficiency -Once patient more awake from anesthesia, appreciate hospitalist recommendations in regards to respiratory status.  Once patient medically stable for removal of arterial line and Foley catheter, removal okay from surgical standpoint -No need for antibiotics postoperatively -Low-fat diet ordered -PRN pain control and antiemetics -Appreciate hospitalist recommendations  Dorothyann Brittle, DO [facility] [address] [place], [place] [address] [phone] (office)

## 2024-02-08 ENCOUNTER — Inpatient Hospital Stay (HOSPITAL_COMMUNITY)

## 2024-02-08 DIAGNOSIS — R0609 Other forms of dyspnea: Secondary | ICD-10-CM

## 2024-02-08 DIAGNOSIS — I5021 Acute systolic (congestive) heart failure: Secondary | ICD-10-CM

## 2024-02-08 DIAGNOSIS — I272 Pulmonary hypertension, unspecified: Secondary | ICD-10-CM | POA: Diagnosis not present

## 2024-02-08 DIAGNOSIS — R109 Unspecified abdominal pain: Secondary | ICD-10-CM | POA: Diagnosis not present

## 2024-02-08 LAB — BASIC METABOLIC PANEL WITH GFR
Anion gap: 13 (ref 5–15)
BUN: 27 mg/dL — ABNORMAL HIGH (ref 6–20)
CO2: 24 mmol/L (ref 22–32)
Calcium: 8.9 mg/dL (ref 8.9–10.3)
Chloride: 101 mmol/L (ref 98–111)
Creatinine, Ser: 2.83 mg/dL — ABNORMAL HIGH (ref 0.44–1.00)
GFR, Estimated: 19 mL/min — ABNORMAL LOW
Glucose, Bld: 180 mg/dL — ABNORMAL HIGH (ref 70–99)
Potassium: 5.2 mmol/L — ABNORMAL HIGH (ref 3.5–5.1)
Sodium: 138 mmol/L (ref 135–145)

## 2024-02-08 LAB — GLUCOSE, CAPILLARY
Glucose-Capillary: 58 mg/dL — ABNORMAL LOW (ref 70–99)
Glucose-Capillary: 68 mg/dL — ABNORMAL LOW (ref 70–99)
Glucose-Capillary: 79 mg/dL (ref 70–99)
Glucose-Capillary: 89 mg/dL (ref 70–99)
Glucose-Capillary: 172 mg/dL — ABNORMAL HIGH (ref 70–99)
Glucose-Capillary: 200 mg/dL — ABNORMAL HIGH (ref 70–99)
Glucose-Capillary: 131 mg/dL — ABNORMAL HIGH (ref 70–99)

## 2024-02-08 LAB — ECHOCARDIOGRAM COMPLETE
AR max vel: 2.07 cm2
AV Area VTI: 2.63 cm2
AV Area mean vel: 1.97 cm2
AV Mean grad: 1.9 mmHg
AV Peak grad: 3.7 mmHg
Ao pk vel: 0.96 m/s
Area-P 1/2: 4.89 cm2
Calc EF: 25.6 %
Height: 60 in
S' Lateral: 4.5 cm
Single Plane A2C EF: 28.9 %
Single Plane A4C EF: 24.6 %
Weight: 2867.74 [oz_av]

## 2024-02-08 LAB — SURGICAL PATHOLOGY

## 2024-02-08 MED ORDER — POLYETHYLENE GLYCOL 3350 17 G PO PACK
17.0000 g | PACK | Freq: Every day | ORAL | Status: DC
  Administered 2024-02-08 – 2024-02-09 (×2): 17 g via ORAL
  Filled 2024-02-08 (×2): qty 1

## 2024-02-08 MED ORDER — DEXTROSE-SODIUM CHLORIDE 5-0.45 % IV SOLN: INTRAVENOUS | Status: DC

## 2024-02-08 MED ORDER — DEXTROSE 10 % IV SOLN: INTRAVENOUS | Status: DC

## 2024-02-08 MED ORDER — TECHNETIUM TO 99M ALBUMIN AGGREGATED
4.0000 | Freq: Once | INTRAVENOUS | Status: AC | PRN
  Administered 2024-02-08: 4 via INTRAVENOUS

## 2024-02-08 MED ORDER — OXYCODONE HCL 5 MG PO TABS: 5.0000 mg | ORAL_TABLET | Freq: Four times a day (QID) | ORAL | 0 refills | Status: AC | PRN

## 2024-02-08 MED ORDER — DOCUSATE SODIUM 100 MG PO CAPS: 100.0000 mg | ORAL_CAPSULE | Freq: Two times a day (BID) | ORAL | 2 refills | Status: DC

## 2024-02-08 MED ORDER — CARVEDILOL 3.125 MG PO TABS
3.1250 mg | ORAL_TABLET | Freq: Two times a day (BID) | ORAL | Status: DC
  Administered 2024-02-08 – 2024-02-09 (×2): 3.125 mg via ORAL
  Filled 2024-02-08 (×2): qty 1

## 2024-02-08 MED ORDER — ONDANSETRON HCL 4 MG PO TABS: 4.0000 mg | ORAL_TABLET | Freq: Three times a day (TID) | ORAL | 0 refills | Status: AC | PRN

## 2024-02-08 MED ORDER — BISACODYL 10 MG RE SUPP
10.0000 mg | Freq: Every day | RECTAL | Status: DC
  Administered 2024-02-08 – 2024-02-09 (×2): 10 mg via RECTAL
  Filled 2024-02-08 (×2): qty 1

## 2024-02-08 MED ORDER — CHLORHEXIDINE GLUCONATE CLOTH 2 % EX PADS
6.0000 | MEDICATED_PAD | Freq: Every day | CUTANEOUS | Status: DC
  Administered 2024-02-08: 6 via TOPICAL

## 2024-02-08 MED ORDER — SENNOSIDES-DOCUSATE SODIUM 8.6-50 MG PO TABS
1.0000 | ORAL_TABLET | Freq: Two times a day (BID) | ORAL | Status: DC
  Administered 2024-02-08 – 2024-02-09 (×2): 1 via ORAL
  Filled 2024-02-08 (×2): qty 1

## 2024-02-08 MED ORDER — ACETAMINOPHEN 500 MG PO TABS: 1000.0000 mg | ORAL_TABLET | Freq: Four times a day (QID) | ORAL | 0 refills | Status: DC

## 2024-02-08 MED ORDER — PERFLUTREN LIPID MICROSPHERE
1.0000 mL | INTRAVENOUS | Status: AC | PRN
  Administered 2024-02-08: 2 mL via INTRAVENOUS

## 2024-02-08 NOTE — Progress Notes (Signed)
 " PROGRESS NOTE  Megan Mullins, is a 54 y.o. female, DOB - 1970-10-27, FMW:[id number]  Admit date - 02/06/2024   Admitting Physician Ejiroghene FORBES Carwin, MD  Outpatient Primary MD for the patient is Patient, No Pcp Per  LOS - 1  Chief Complaint  Patient presents with   Back Pain   Flank Pain      Brief Narrative:  54 y.o. female with medical history significant for hypertension, diabetes mellitus, CKD 3b admitted on 02/06/2024 with concerns for  Biliary dyskinesia  and possible acalculous cholecystitis. - Patient underwent lap chole on 02/05/2024 developed acute hypoxic respiratory failure postoperatively    -Assessment and Plan: 1) Biliary dyskinesia/intractable abdominal pain--- -CT abdomen and pelvis, right upper quadrant ultrasound and HIDA scan noted  status post lap chole -- Postoperative management per surgical team  2)Acute hypoxic respiratory failure----due to CHF  --chest x-ray and clinical exam on 02/06/2018 suggested possible volume overload/pulmonary venous congestion Much improved with IV Lasix  --Patient has been weaned off oxygen  at this time hypoxia is resolved - Pneumonia deemed to be less likely so okay to discontinue Rocephin  / azithromycin  after 02/08/2024 dose -Lower extremity venous Dopplers negative for DVT - VQ scan pending-  3) acute systolic dysfunction CHF exacerbation/HFrEF-- -echo from 02/07/2021 showed EF of 20 to 25%, with global hypokinesis of the left ventricle, mild LVH, moderately dilated left and right atria, severe pulmonary hypertension, moderate to severe MR, No AS, No MS --No prior echo for comparison Much improved with IV Lasix ,  -- Cardiology consult appreciated,  -per cardiologist okay to change Lasix  to as needed -Start Coreg today -May add isosorbide/hydralazine  combo on 02/08/2026 if BP allows Avoid ACEI/ARB/ARNI/MRA/SGLT2i  due to renal concerns -- Daily weight and fluid input and output monitor  4) OSA/Class 2 obesity--- patient  clearly has obstructive sleep apnea -- She desaturates into the 70s when she falls asleep - Use BiPAP nightly - She will need outpatient sleep study postdischarge -Low calorie diet, portion control and increase physical activity discussed with patient after acute medical problems resolved -Body mass index is 35 kg/m.  5)Fatty Liver--AST/ALT unremarkable - Alk phos elevated T. bili WNL -- Fasting lipid profile unremarkable with LDL of 41 triglycerides of 94 HDL of 67, viral hepatitis profile negative -- diet and lifestyle modifications as outlined in #3 above  6)CKD stage -3B   -renal function currently close to baseline -- renally adjust medications, avoid nephrotoxic agents / dehydration  / hypotension  7)HTN--stable, continue amlodipine  5 mg daily and Coreg - Monitor BPs closely while on IV Lasix   8)DM2 --A1c greater than 15.5 consistent with uncontrolled DM with hyperglycemia - Resume insulin  therapy--follow-up with PCP for further adjustments of her regimen Use Novolog /Humalog Sliding scale insulin  with Accu-Cheks/Fingersticks as ordered   9) polysubstance abuse--including cocaine, alcohol and tobacco --Patient declines referral to drug rehab programs -  Status is: Inpatient   Disposition: The patient is from: Home              Anticipated d/c is to: Home              Anticipated d/c date is: 1 day              Patient currently is not medically stable to d/c. Barriers: Not Clinically Stable-   Code Status :  -  Code Status: Full Code   Family Communication:    NA (patient is alert, awake and coherent)   DVT Prophylaxis  :   - SCDs  heparin  injection 5,000 Units Start: 02/07/24 2200   Lab Results  Component Value Date   PLT 383 02/06/2024    Inpatient Medications  Scheduled Meds:  acetaminophen   1,000 mg Oral Q6H   atorvastatin   20 mg Oral Daily   azithromycin   500 mg Oral Daily   bisacodyl   10 mg Rectal Daily   carvedilol  3.125 mg Oral BID WC    Chlorhexidine  Gluconate Cloth  6 each Topical Daily   heparin  injection (subcutaneous)  5,000 Units Subcutaneous Q8H   insulin  aspart  0-5 Units Subcutaneous QHS   insulin  aspart  0-9 Units Subcutaneous TID WC   insulin  glargine  6 Units Subcutaneous QHS   ipratropium-albuterol   3 mL Nebulization TID   polyethylene glycol  17 g Oral Daily   senna-docusate  1 tablet Oral BID   Continuous Infusions:  cefTRIAXone  (ROCEPHIN )  IV Stopped (02/07/24 2051)   PRN Meds:.albuterol , HYDROmorphone  (DILAUDID ) injection, labetalol , ondansetron  **OR** ondansetron  (ZOFRAN ) IV, oxyCODONE    Anti-infectives (From admission, onward)    Start     Dose/Rate Route Frequency Ordered Stop   02/07/24 2000  cefTRIAXone  (ROCEPHIN ) 2 g in sodium chloride  0.9 % 100 mL IVPB        2 g 200 mL/hr over 30 Minutes Intravenous Every 24 hours 02/07/24 1510     02/07/24 1700  azithromycin  (ZITHROMAX ) tablet 500 mg        500 mg Oral Daily 02/07/24 1510 02/10/24 0959   02/07/24 1154  cefoTEtan  (CEFOTAN ) 2 g in sodium chloride  0.9 % 100 mL IVPB        2 g 200 mL/hr over 30 Minutes Intravenous On call to O.R. 02/07/24 1144 02/07/24 1520   02/07/24 1145  sodium chloride  0.9 % with cefoTEtan  (CEFOTAN ) ADS Med       Note to Pharmacy: Ulla Hurl S: cabinet override      02/07/24 1145 02/07/24 1233         Subjective: Megan Mullins today has no fevers, no further emesis,  No chest pain,   --Dyspnea improved - VQ scan report pending - No leg pains or pleuritic symptoms - Voiding well  Objective: Vitals:   02/08/24 0845 02/08/24 1245 02/08/24 1422 02/08/24 1612  BP:      Pulse:      Resp:      Temp:  97.9 F (36.6 C)  (!) 97.5 F (36.4 C)  TempSrc:  Oral  Oral  SpO2: 95%  95%   Weight:      Height:        Intake/Output Summary (Last 24 hours) at 02/08/2024 1752 Last data filed at 02/08/2024 9297 Gross per 24 hour  Intake 1023.36 ml  Output 650 ml  Net 373.36 ml   Filed Weights   02/07/24 1140  02/07/24 1600 02/08/24 0503  Weight: 80.7 kg 82 kg 81.3 kg    Physical Exam  Gen:- Awake Alert, no acute distress HEENT:- [place].AT, No sclera icterus Neck-Supple Neck,  JVD improving.  Lungs-diminished in bases, no wheezing  CV- S1, S2 normal, regular 3/6 SM  Abd-  +ve B.Sounds, Abd Soft, postoperative wounds are clean dry and intact, appropriate postoperative tenderness , increased truncal adiposity Extremity/Skin:- No significant edema, negative Homans pedal pulses present  Psych-affect is appropriate, oriented x3 Neuro-no new focal deficits, no tremors  Data Reviewed: I have personally reviewed following labs and imaging studies  CBC: Recent Labs  Lab 02/06/24 1343  WBC 5.3  NEUTROABS 3.4  HGB 13.7  HCT  41.4  MCV 97.9  PLT 383   Basic Metabolic Panel: Recent Labs  Lab 02/06/24 1343 02/07/24 0338 02/08/24 1436  NA 139 141 138  K 4.2 3.9 5.2*  CL 103 106 101  CO2 21* 22 24  GLUCOSE 116* 104* 180*  BUN 27* 26* 27*  CREATININE 1.91* 2.09* 2.83*  CALCIUM  9.3 8.7* 8.9   GFR: Estimated Creatinine Clearance: 22.2 mL/min (A) (by C-G formula based on SCr of 2.83 mg/dL (H)). Liver Function Tests: Recent Labs  Lab 02/06/24 1343 02/07/24 0338  AST 42* 33  ALT 36 33  ALKPHOS 214* 180*  BILITOT 0.7 0.4  PROT 7.1 6.3*  ALBUMIN 3.5 3.2*   Radiology Studies: ECHOCARDIOGRAM COMPLETE Result Date: 02/08/2024    ECHOCARDIOGRAM REPORT   Patient Name:   DENIM START Date of Exam: 02/08/2024 Medical Rec #:  [id number]     Height:       60.0 in Accession #:    [id number]    Weight:       179.2 lb Date of Birth:  September 21, 1970     BSA:          1.782 m Patient Age:    53 years      BP:           124/103 mmHg Patient Gender: F             HR:           84 bpm. Exam Location:  [facility] Procedure: 2D Echo, Color Doppler, Cardiac Doppler and Intracardiac            Opacification Agent (Both Spectral and Color Flow Doppler were            utilized during procedure). Indications:    Dyspnea  R06.00  History:        Patient has no prior history of Echocardiogram examinations.                 Risk Factors:Hypertension and Diabetes.  Sonographer:    Sydnee Wilson RDCS Referring Phys: [id number] Giara Mcgaughey IMPRESSIONS  1. Left ventricular ejection fraction, by estimation, is 20 to 25%. The left ventricle has severely decreased function. The left ventricle demonstrates global hypokinesis. There is mild left ventricular hypertrophy. Left ventricular diastolic parameters  are indeterminate.  2. Right ventricular systolic function is mildly reduced. The right ventricular size is moderately enlarged. There is severely elevated pulmonary artery systolic pressure.  3. Left atrial size was moderately dilated.  4. Right atrial size was moderately dilated.  5. The mitral valve is abnormal. Moderate to severe mitral valve regurgitation. No evidence of mitral stenosis.  6. The tricuspid valve is abnormal. Tricuspid valve regurgitation is severe.  7. The aortic valve is tricuspid. Aortic valve regurgitation is not visualized. No aortic stenosis is present.  8. The inferior vena cava is dilated in size with <50% respiratory variability, suggesting right atrial pressure of 15 mmHg. FINDINGS  Left Ventricle: Left ventricular ejection fraction, by estimation, is 20 to 25%. The left ventricle has severely decreased function. The left ventricle demonstrates global hypokinesis. Definity contrast agent was given IV to delineate the left ventricular endocardial borders. The left ventricular internal cavity size was normal in size. There is mild left ventricular hypertrophy. Left ventricular diastolic parameters are indeterminate. Right Ventricle: The right ventricular size is moderately enlarged. Right vetricular wall thickness was not well visualized. Right ventricular systolic function is mildly reduced. There is severely elevated pulmonary artery systolic pressure.  The tricuspid regurgitant velocity is 3.67 m/s, and with an  assumed right atrial pressure of 15 mmHg, the estimated right ventricular systolic pressure is 68.9 mmHg. Left Atrium: Left atrial size was moderately dilated. Right Atrium: Right atrial size was moderately dilated. Pericardium: There is no evidence of pericardial effusion. Mitral Valve: The mitral valve is abnormal. Moderate to severe mitral valve regurgitation. No evidence of mitral valve stenosis. Tricuspid Valve: Systolic hepatic vein flow reversal consistent with severe TR. The tricuspid valve is abnormal. Tricuspid valve regurgitation is severe. No evidence of tricuspid stenosis. Aortic Valve: The aortic valve is tricuspid. Aortic valve regurgitation is not visualized. No aortic stenosis is present. Aortic valve mean gradient measures 1.9 mmHg. Aortic valve peak gradient measures 3.7 mmHg. Aortic valve area, by VTI measures 2.63 cm. Pulmonic Valve: The pulmonic valve was not well visualized. Pulmonic valve regurgitation is mild. No evidence of pulmonic stenosis. Aorta: The aortic root and ascending aorta are structurally normal, with no evidence of dilitation. Venous: The inferior vena cava is dilated in size with less than 50% respiratory variability, suggesting right atrial pressure of 15 mmHg. IAS/Shunts: No atrial level shunt detected by color flow Doppler.  LEFT VENTRICLE PLAX 2D LVIDd:         4.80 cm      Diastology LVIDs:         4.50 cm      LV e' medial:    8.05 cm/s LV PW:         1.20 cm      LV E/e' medial:  13.3 LV IVS:        1.20 cm      LV e' lateral:   9.68 cm/s LVOT diam:     2.10 cm      LV E/e' lateral: 11.1 LV SV:         35 LV SV Index:   20 LVOT Area:     3.46 cm  LV Volumes (MOD) LV vol d, MOD A2C: 126.0 ml LV vol d, MOD A4C: 110.0 ml LV vol s, MOD A2C: 89.6 ml LV vol s, MOD A4C: 82.9 ml LV SV MOD A2C:     36.4 ml LV SV MOD A4C:     110.0 ml LV SV MOD BP:      30.7 ml RIGHT VENTRICLE RV S prime:     9.03 cm/s TAPSE (M-mode): 1.2 cm LEFT ATRIUM              Index        RIGHT ATRIUM            Index LA diam:        4.70 cm  2.64 cm/m   RA Area:     21.70 cm LA Vol (A2C):   102.0 ml 57.25 ml/m  RA Volume:   72.90 ml  40.92 ml/m LA Vol (A4C):   70.7 ml  39.68 ml/m LA Biplane Vol: 85.2 ml  47.82 ml/m  AORTIC VALVE AV Area (Vmax):    2.07 cm AV Area (Vmean):   1.97 cm AV Area (VTI):     2.63 cm AV Vmax:           96.25 cm/s AV Vmean:          63.697 cm/s AV VTI:            0.135 m AV Peak Grad:      3.7 mmHg AV Mean Grad:      1.9 mmHg  LVOT Vmax:         57.50 cm/s LVOT Vmean:        36.300 cm/s LVOT VTI:          0.102 m LVOT/AV VTI ratio: 0.76  AORTA Ao Root diam: 3.00 cm Ao Asc diam:  3.10 cm MITRAL VALVE                TRICUSPID VALVE MV Area (PHT): 4.89 cm     TR Peak grad:   53.9 mmHg MV Decel Time: 155 msec     TR Vmax:        367.00 cm/s MV E velocity: 107.00 cm/s MV A velocity: 49.80 cm/s   SHUNTS MV E/A ratio:  2.15         Systemic VTI:  0.10 m                             Systemic Diam: 2.10 cm Dorn Ross MD Electronically signed by Dorn Ross MD Signature Date/Time: 02/08/2024/12:52:42 PM    Final    US  Venous Img Lower Bilateral (DVT) Result Date: 02/07/2024 CLINICAL DATA:  Postop shortness of breath EXAM: Bilateral Lower Extremity Venous Doppler Ultrasound TECHNIQUE: Gray-scale sonography with compression, as well as color and duplex ultrasound, were performed to evaluate the deep venous system(s) from the level of the common femoral vein through the popliteal and proximal calf veins. COMPARISON:  None available FINDINGS: VENOUS Normal compressibility of the common femoral, femoral, and popliteal veins, as well as the visualized calf veins. Visualized portions of profunda femoral vein and great saphenous vein unremarkable. No filling defects to suggest DVT on grayscale or color Doppler imaging. Doppler waveforms show normal direction of venous flow, normal respiratory plasticity and response to augmentation. OTHER None. Limitations: Limited visualization of calf  veins due to presence of compression devices. IMPRESSION: No lower extremity DVT. Electronically Signed   By: Aliene Lloyd M.D.   On: 02/07/2024 16:56   DG Chest Port 1 View Result Date: 02/07/2024 CLINICAL DATA:  Hypoxia. EXAM: PORTABLE CHEST 1 VIEW COMPARISON:  Chest radiograph dated 02/20/2021. FINDINGS: There is cardiomegaly with vascular congestion. Left lung interstitial densities may represent mild congestion/edema. Atypical pneumonia is not excluded. No pleural effusion pneumothorax. No acute osseous pathology. IMPRESSION: 1. Cardiomegaly with vascular congestion. 2. Left lung interstitial densities may represent mild congestion/edema. Pneumonia is not excluded. Electronically Signed   By: Vanetta Chou M.D.   On: 02/07/2024 14:18   NM Hepato W/EF Result Date: 02/07/2024 CLINICAL DATA:  Right-sided abdominal pain for 1 day. Clinical concern for acalculous cholecystitis. EXAM: NUCLEAR MEDICINE HEPATOBILIARY IMAGING WITH GALLBLADDER EF TECHNIQUE: Sequential images of the abdomen were obtained out to 60 minutes following intravenous administration of radiopharmaceutical. After slow intravenous infusion of 1.6 micrograms Cholecystokinin, gallbladder ejection fraction was determined. RADIOPHARMACEUTICALS:  5.1 mCi Tc-30m Choletec  IV COMPARISON:  Abdominal CT and ultrasound 02/06/2024. FINDINGS: Prompt uptake and biliary excretion of activity by the liver is seen. Gallbladder activity is visualized, consistent with patency of cystic duct. Biliary activity passes into small bowel, consistent with patent common bile duct. Calculated gallbladder ejection fraction is 15%. (At 60 min, normal ejection fraction is greater than 40% and less than 80%.) IMPRESSION: 1. The cystic and common bile ducts are patent. No evidence of acute cholecystitis. 2. 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 Electronically Signed   By: Elsie Perone M.D.   On: 02/07/2024  10:48   Scheduled Meds:  acetaminophen   1,000 mg Oral Q6H   atorvastatin   20 mg Oral Daily   azithromycin   500 mg Oral Daily   bisacodyl   10 mg Rectal Daily   carvedilol  3.125 mg Oral BID WC   Chlorhexidine  Gluconate Cloth  6 each Topical Daily   heparin  injection (subcutaneous)  5,000 Units Subcutaneous Q8H   insulin  aspart  0-5 Units Subcutaneous QHS   insulin  aspart  0-9 Units Subcutaneous TID WC   insulin  glargine  6 Units Subcutaneous QHS   ipratropium-albuterol   3 mL Nebulization TID   polyethylene glycol  17 g Oral Daily   senna-docusate  1 tablet Oral BID   Continuous Infusions:  cefTRIAXone  (ROCEPHIN )  IV Stopped (02/07/24 2051)    LOS: 1 day   Rendall Carwin M.D on 02/08/2024 at 5:52 PM  Go to [email or web address] - for contact info  [facility] - Office  [phone]  If 7PM-7AM, please contact night-coverage [email or web address] 02/08/2024, 5:52 PM    "

## 2024-02-08 NOTE — Progress Notes (Signed)
 I was present with the medical student for this service. I personally verified the history of present illness, performed the physical exam, and made the plan for this encounter. I have verified the medical student's documentation and made modifications where appropriately. I have personally documented in my own words a brief history, physical, and plan below.     Patient seen and examined.  She is resting comfortably in bed.  She was able to tolerate a diet without nausea and vomiting.  She has some abdominal discomfort, but otherwise has no complaints.  She denies any shortness of breath.  Exam: Abdomen: Soft, nondistended, no percussion tenderness, minimal incisional tenderness to palpation; no rigidity, guarding, rebound tenderness; laparoscopic incision sites C/D/I with Dermabond in place  Assessment and plan: Patient is a 54 year old female who was admitted with intractable abdominal pain and HIDA scan demonstrating biliary dyskinesia.  She is status post robotic assisted laparoscopic cholecystectomy on 1/22.  -From a postoperative surgical standpoint, patient is doing well.  Tolerating a diet and pain controlled -Continue low-fat diet -PRN pain control and antiemetics -Patient with chronic constipation.  Bowel regimen ordered with Senokot-S, MiraLAX , and Dulcolax suppositories -Patient underwent VQ scan today given her intraoperative hypoxia to evaluate for PE -If PE noted on VQ scan, okay for therapeutic anticoagulation from surgical standpoint -Patient stable for discharge from a surgical standpoint once medically cleared -Appreciate hospitalist recommendations  Dorothyann Brittle, DO [facility] [address] [place], [place] [address] [phone] (office)   1 Day Post-Op  Subjective: Patient reports no acute concerns this morning. Has not ambulated since surgery. Endorses minimal pain. Denies nausea, vomiting, fevers, chills. Has not had BM or  passed flatus. Has not eaten much since surgery, has only had a cup of ice cream.   Objective: Vital signs in last 24 hours: Temp:  [97.1 F (36.2 C)-98.2 F (36.8 C)] 97.9 F (36.6 C) (01/23 0749) Pulse Rate:  [64-98] 75 (01/23 0700) Resp:  [10-40] 18 (01/23 0700) BP: (107-170)/(83-129) 124/103 (01/23 0700) SpO2:  [41 %-100 %] 100 % (01/23 0700) Arterial Line BP: (100-181)/(79-116) 134/94 (01/23 0600) Weight:  [80.7 kg-82 kg] 81.3 kg (01/23 0503) Last BM Date : 02/04/24  Intake/Output from previous day: 01/22 0701 - 01/23 0700 In: 2226.2 [P.O.:240; I.V.:1786.2; IV Piggyback:200] Out: 780 [Urine:775; Blood:5] Intake/Output this shift: Total I/O In: 229.5 [I.V.:229.5] Out: -   General appearance: alert, cooperative, and no distress Resp: Breathing comfortably on room air  Cardio: regular rate and rhythm, S1, S2 normal, no murmur, click, rub or gallop GI: soft, non distended, tender in epigastric area. No rigidity, guarding or rebound tenderness. Incision/Wound:Clean and dry. No pus, erythema, or swelling.   Lab Results:  Recent Labs    02/06/24 1343  WBC 5.3  HGB 13.7  HCT 41.4  PLT 383   BMET Recent Labs    02/06/24 1343 02/07/24 0338  NA 139 141  K 4.2 3.9  CL 103 106  CO2 21* 22  GLUCOSE 116* 104*  BUN 27* 26*  CREATININE 1.91* 2.09*  CALCIUM  9.3 8.7*   PT/INR Recent Labs    02/06/24 1343  LABPROT 13.1  INR 0.9    Studies/Results: US  Venous Img Lower Bilateral (DVT) Result Date: 02/07/2024 CLINICAL DATA:  Postop shortness of breath EXAM: Bilateral Lower Extremity Venous Doppler Ultrasound TECHNIQUE: Gray-scale sonography with compression, as well as color and duplex ultrasound, were performed to evaluate the deep venous system(s) from the level of the common femoral vein through the  popliteal and proximal calf veins. COMPARISON:  None available FINDINGS: VENOUS Normal compressibility of the common femoral, femoral, and popliteal veins, as well as the  visualized calf veins. Visualized portions of profunda femoral vein and great saphenous vein unremarkable. No filling defects to suggest DVT on grayscale or color Doppler imaging. Doppler waveforms show normal direction of venous flow, normal respiratory plasticity and response to augmentation. OTHER None. Limitations: Limited visualization of calf veins due to presence of compression devices. IMPRESSION: No lower extremity DVT. Electronically Signed   By: Aliene Lloyd M.D.   On: 02/07/2024 16:56   DG Chest Port 1 View Result Date: 02/07/2024 CLINICAL DATA:  Hypoxia. EXAM: PORTABLE CHEST 1 VIEW COMPARISON:  Chest radiograph dated 02/20/2021. FINDINGS: There is cardiomegaly with vascular congestion. Left lung interstitial densities may represent mild congestion/edema. Atypical pneumonia is not excluded. No pleural effusion pneumothorax. No acute osseous pathology. IMPRESSION: 1. Cardiomegaly with vascular congestion. 2. Left lung interstitial densities may represent mild congestion/edema. Pneumonia is not excluded. Electronically Signed   By: Vanetta Chou M.D.   On: 02/07/2024 14:18   NM Hepato W/EF Result Date: 02/07/2024 CLINICAL DATA:  Right-sided abdominal pain for 1 day. Clinical concern for acalculous cholecystitis. EXAM: NUCLEAR MEDICINE HEPATOBILIARY IMAGING WITH GALLBLADDER EF TECHNIQUE: Sequential images of the abdomen were obtained out to 60 minutes following intravenous administration of radiopharmaceutical. After slow intravenous infusion of 1.6 micrograms Cholecystokinin, gallbladder ejection fraction was determined. RADIOPHARMACEUTICALS:  5.1 mCi Tc-43m Choletec  IV COMPARISON:  Abdominal CT and ultrasound 02/06/2024. FINDINGS: Prompt uptake and biliary excretion of activity by the liver is seen. Gallbladder activity is visualized, consistent with patency of cystic duct. Biliary activity passes into small bowel, consistent with patent common bile duct. Calculated gallbladder ejection fraction  is 15%. (At 60 min, normal ejection fraction is greater than 40% and less than 80%.) IMPRESSION: 1. The cystic and common bile ducts are patent. No evidence of acute cholecystitis. 2. 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 Electronically Signed   By: Elsie Perone M.D.   On: 02/07/2024 10:48   US  Abdomen Limited RUQ (LIVER/GB) Result Date: 02/06/2024 CLINICAL DATA:  Right upper quadrant abdominal pain. EXAM: ULTRASOUND ABDOMEN LIMITED RIGHT UPPER QUADRANT COMPARISON:  CT abdomen pelvis dated 02/06/2024. FINDINGS: Gallbladder: No gallstone. The gallbladder wall is thickened and edematous measuring up to 13 mm. Positive sonographic Murphy's sign is reported. Common bile duct: Diameter: 4 mm Liver: There is diffuse increased liver echogenicity most commonly seen in the setting of fatty infiltration. Superimposed inflammation or fibrosis is not excluded. Clinical correlation is recommended. Portal vein is patent on color Doppler imaging with normal direction of blood flow towards the liver. Other: None. IMPRESSION: 1. No gallstone. Thickened gallbladder wall with reported sonographic Murphy's sign. Although findings may be related to underlying liver disease, an acalculous cholecystitis cannot be excluded. A hepatobiliary scintigraphy may provide better evaluation of the gallbladder if there is a high clinical concern for acute cholecystitis . 2. Fatty liver. Electronically Signed   By: Vanetta Chou M.D.   On: 02/06/2024 16:19   CT ABDOMEN PELVIS W CONTRAST Result Date: 02/06/2024 CLINICAL DATA:  Right-sided abdominal pain. EXAM: CT ABDOMEN AND PELVIS WITH CONTRAST TECHNIQUE: Multidetector CT imaging of the abdomen and pelvis was performed using the standard protocol following bolus administration of intravenous contrast. RADIATION DOSE REDUCTION: This exam was performed according to the departmental dose-optimization program which includes  automated exposure control, adjustment of the mA and/or kV according to patient  size and/or use of iterative reconstruction technique. CONTRAST:  80mL OMNIPAQUE  IOHEXOL  300 MG/ML  SOLN COMPARISON:  CT abdomen pelvis dated 07/28/2021. FINDINGS: Lower chest: The visualized lung bases are clear. No intra-abdominal free air or free fluid. Hepatobiliary: Fatty appearing liver. Slight heterogeneous enhancement of the liver parenchyma may represent hepatitis. Clinical correlation recommended. No biliary dilatation. No calcified gallstone. There is diffuse gallbladder wall thickening versus pericholecystic edema which may be related to underlying liver disease. Ultrasound may provide better evaluation if there is clinical concern for acute gallbladder pathology. Pancreas: Unremarkable. No pancreatic ductal dilatation or surrounding inflammatory changes. Spleen: Normal in size without focal abnormality. Adrenals/Urinary Tract: The right adrenal glands unremarkable. Mild left adrenal thickening/hyperplasia or adenoma. There is mild bilateral renal parenchyma atrophy. There is no hydronephrosis on either side. There is symmetric enhancement and excretion of contrast by both kidneys. The visualized ureters and urinary bladder appear unremarkable. Stomach/Bowel: There is sigmoid diverticulosis. There is no bowel obstruction or active inflammation. The appendix is normal. Vascular/Lymphatic: Mild aortoiliac atherosclerotic disease. The IVC is unremarkable. No portal venous gas. There is no adenopathy. Reproductive: The uterus is anteverted. No suspicious adnexal masses. Other: Mild diffuse subcutaneous edema Musculoskeletal: Severe arthritic changes of the hips. No acute osseous pathology. IMPRESSION: 1. Fatty liver with possible hepatitis. Clinical correlation is recommended. 2. Diffuse gallbladder wall thickening versus pericholecystic edema which may be related to underlying liver disease. Ultrasound may provide better  evaluation if there is clinical concern for acute gallbladder pathology. 3. Sigmoid diverticulosis. No bowel obstruction. Normal appendix. 4.  Aortic Atherosclerosis (ICD10-I70.0). Electronically Signed   By: Vanetta Chou M.D.   On: 02/06/2024 15:46    Anti-infectives: Anti-infectives (From admission, onward)    Start     Dose/Rate Route Frequency Ordered Stop   02/07/24 2000  cefTRIAXone  (ROCEPHIN ) 2 g in sodium chloride  0.9 % 100 mL IVPB        2 g 200 mL/hr over 30 Minutes Intravenous Every 24 hours 02/07/24 1510     02/07/24 1700  azithromycin  (ZITHROMAX ) tablet 500 mg        500 mg Oral Daily 02/07/24 1510 02/10/24 0959   02/07/24 1154  cefoTEtan  (CEFOTAN ) 2 g in sodium chloride  0.9 % 100 mL IVPB        2 g 200 mL/hr over 30 Minutes Intravenous On call to O.R. 02/07/24 1144 02/07/24 1520   02/07/24 1145  sodium chloride  0.9 % with cefoTEtan  (CEFOTAN ) AD[facility] Med       Note to Pharmacy: Ulla Hurl S: cabinet override      02/07/24 1145 02/07/24 1233       Assessment/Plan: s/p Procedures: CHOLECYSTECTOMY, ROBOT-ASSISTED, LAPAROSCOPIC  POD#1 s/p cholecystectomy. Patient doing well and would be good to discharge from surgical standpoint. However will refer to hospitalist if further medical management is needed  Plan:  -Begin ambulation -Bowl regimen including senna, miralax , and dulcolax  -Advance diet as tolerated -Once patient medically stable for removal of arterial line and Foley catheter, removal okay from surgical standpoint  -PRN pain control and antiemetics -Appreciate hospitalist recommendations   [place] JINNY Pereyra 02/08/2024

## 2024-02-08 NOTE — Consult Note (Addendum)
 "  Cardiology Consultation   Patient ID: Megan Mullins MRN: [id number]; DOB: 08-10-1970  Admit date: 02/06/2024 Date of Consult: 02/08/2024  PCP:  Patient, No Pcp Per   [facility] Providers Cardiologist:  New   Patient Profile: Megan Mullins is a 54 y.o. female with a hx of HTN, DM2, CKD3B, polysubstance abuse who is being seen 02/08/2024 for the evaluation of SOB at the request of Dr Rendall.  History of Present Illness: Megan Mullins 54 yo female history of HTN, DM2, CKD 3B initially admitted 02/06/24 with abdominal pain. Work up per primary team and general surgery, diagnosed with biliary dyskinesia and intractable abdominal pain. On 02/07/24 she had lap chole. Some issues with hypoxia during the case but able to complete. Postop ongoing respiratory issues with hypoxia, signs of fluid overload. Echo done today and showed LVEF 20-25%, cardiology consulted to help manage   K 4.2 Cr 1.91 GFR 31 WBC 5.3 Hgb 13.7 Plt 383  EKG SR, no acute ischemic changes CXR: cardiomegaly with vascular congestion Jan 2026 echo: LVEF 20-25%, indet diastolic function, mild RV dysfunction, severe pulm HTN, mod BAE, mod tos evere MR, severe TR  Past Medical History:  Diagnosis Date   Diabetes mellitus without complication (HCC)    Hypertension     Past Surgical History:  Procedure Laterality Date   IRRIGATION AND DEBRIDEMENT OF NECROTIZING SOFT TISSUE INFECTION Right 04/23/2023   Procedure: IRRIGATION, DRAINAGE AND DEBRIDEMENT OF NECROTIZING SOFT TISSUE INFECTION RIGHT GROIN DOWN TO MUSCLE, 11CM X 12CM X 5CM;  Surgeon: Evonnie Dorothyann LABOR, DO;  Location: [facility];  Service: General;  Laterality: Right;   TUBAL LIGATION        Scheduled Meds:  acetaminophen   1,000 mg Oral Q6H   amLODipine   5 mg Oral Daily   atorvastatin   20 mg Oral Daily   azithromycin   500 mg Oral Daily   bisacodyl   10 mg Rectal Daily   Chlorhexidine  Gluconate Cloth  6 each Topical Daily   furosemide   40 mg Intravenous Q8H    heparin  injection (subcutaneous)  5,000 Units Subcutaneous Q8H   insulin  aspart  0-5 Units Subcutaneous QHS   insulin  aspart  0-9 Units Subcutaneous TID WC   insulin  glargine  6 Units Subcutaneous QHS   ipratropium-albuterol   3 mL Nebulization TID   polyethylene glycol  17 g Oral Daily   senna-docusate  1 tablet Oral BID   Continuous Infusions:  cefTRIAXone  (ROCEPHIN )  IV Stopped (02/07/24 2051)   PRN Meds: albuterol , HYDROmorphone  (DILAUDID ) injection, labetalol , ondansetron  **OR** ondansetron  (ZOFRAN ) IV, oxyCODONE , perflutren lipid microspheres (DEFINITY) IV suspension  Allergies:   Allergies[1]  Social History:   Social History   Socioeconomic History   Marital status: Single    Spouse name: Not on file   Number of children: Not on file   Years of education: Not on file   Highest education level: Not on file  Occupational History   Not on file  Tobacco Use   Smoking status: Every Day    Current packs/day: 0.50    Types: Cigarettes   Smokeless tobacco: Never  Vaping Use   Vaping status: Never Used  Substance and Sexual Activity   Alcohol use: Not Currently    Comment: haven't drank in 2xmos   Drug use: Not Currently    Types: Cocaine    Comment: last used today   Sexual activity: Yes    Birth control/protection: Surgical  Other Topics Concern   Not on file  Social History Narrative   Lives alone, on disability.    Social Drivers of Health   Tobacco Use: High Risk (02/07/2024)   Patient History    Smoking Tobacco Use: Every Day    Smokeless Tobacco Use: Never    Passive Exposure: Not on file  Financial Resource Strain: Not on file  Food Insecurity: No Food Insecurity (02/06/2024)   Epic    Worried About Programme Researcher, Broadcasting/film/video in the Last Year: Never true    Ran Out of Food in the Last Year: Never true  Transportation Needs: No Transportation Needs (02/06/2024)   Epic    Lack of Transportation (Medical): No    Lack of Transportation (Non-Medical): No   Physical Activity: Not on file  Stress: Not on file  Social Connections: Moderately Isolated (04/23/2023)   Social Connection and Isolation Panel    Frequency of Communication with Friends and Family: More than three times a week    Frequency of Social Gatherings with Friends and Family: More than three times a week    Attends Religious Services: More than 4 times per year    Active Member of [employer] or Organizations: No    Attends Banker Meetings: Never    Marital Status: Never married  Intimate Partner Violence: Unknown (02/06/2024)   Epic    Fear of Current or Ex-Partner: No    Emotionally Abused: No    Physically Abused: Not on file    Sexually Abused: Not on file  Depression (EYV7-0): Not on file  Alcohol Screen: Not on file  Housing: Low Risk (02/06/2024)   Epic    Unable to Pay for Housing in the Last Year: No    Number of Times Moved in the Last Year: 0    Homeless in the Last Year: No  Utilities: Not At Risk (02/06/2024)   Epic    Threatened with loss of utilities: No  Health Literacy: Not on file    Family History:    Family History  Problem Relation Age of Onset   Heart disease Mother    Diabetes Mother      ROS:  Please see the history of present illness.   All other ROS reviewed and negative.     Physical Exam/Data: Vitals:   02/08/24 0700 02/08/24 0749 02/08/24 0845 02/08/24 1245  BP: (!) 124/103     Pulse: 75     Resp: 18     Temp:  97.9 F (36.6 C)  97.9 F (36.6 C)  TempSrc:  Oral  Oral  SpO2: 100%  95%   Weight:      Height:        Intake/Output Summary (Last 24 hours) at 02/08/2024 1301 Last data filed at 02/08/2024 9297 Gross per 24 hour  Intake 2355.78 ml  Output 780 ml  Net 1575.78 ml      02/08/2024    5:03 AM 02/07/2024    4:00 PM 02/07/2024   11:40 AM  Last 3 Weights  Weight (lbs) 179 lb 3.7 oz 180 lb 12.4 oz 178 lb  Weight (kg) 81.3 kg 82 kg 80.74 kg     Body mass index is 35 kg/m.  General:  Well nourished, well  developed, in no acute distress HEENT: normal Neck: +JVD Vascular: No carotid bruits; Distal pulses 2+ bilaterally Cardiac:  RRR, 2/6 systolic murmur apex Lungs:  clear to auscultation bilaterally, no wheezing, rhonchi or rales  Abd: soft, nontender, no hepatomegaly  Ext: no edema Musculoskeletal:  No deformities, BUE and BLE strength normal and equal Skin: warm and dry  Neuro:  CNs 2-12 intact, no focal abnormalities noted Psych:  Normal affect     Laboratory Data: High Sensitivity Troponin:  No results for input(s): TROPONINIHS in the last 720 hours. No results for input(s): TRNPT in the last 720 hours.    Chemistry Recent Labs  Lab 02/06/24 1343 02/07/24 0338  NA 139 141  K 4.2 3.9  CL 103 106  CO2 21* 22  GLUCOSE 116* 104*  BUN 27* 26*  CREATININE 1.91* 2.09*  CALCIUM  9.3 8.7*  GFRNONAA 31* 28*  ANIONGAP 14 14    Recent Labs  Lab 02/06/24 1343 02/07/24 0338  PROT 7.1 6.3*  ALBUMIN 3.5 3.2*  AST 42* 33  ALT 36 33  ALKPHOS 214* 180*  BILITOT 0.7 0.4   Lipids  Recent Labs  Lab 02/07/24 0338  CHOL 127  TRIG 94  HDL 67  LDLCALC 41  CHOLHDL 1.9    Hematology Recent Labs  Lab 02/06/24 1343  WBC 5.3  RBC 4.23  HGB 13.7  HCT 41.4  MCV 97.9  MCH 32.4  MCHC 33.1  RDW 15.6*  PLT 383   Thyroid  No results for input(s): TSH, FREET4 in the last 168 hours.  BNPNo results for input(s): BNP, PROBNP in the last 168 hours.  DDimer No results for input(s): DDIMER in the last 168 hours.  Radiology/Studies:  ECHOCARDIOGRAM COMPLETE Result Date: 02/08/2024    ECHOCARDIOGRAM REPORT   Patient Name:   Megan Mullins Date of Exam: 02/08/2024 Medical Rec #:  [id number]     Height:       60.0 in Accession #:    [id number]    Weight:       179.2 lb Date of Birth:  [birth year]     BSA:          1.782 m Patient Age:    53 years      BP:           124/103 mmHg Patient Gender: F             HR:           84 bpm. Exam Location:  [facility] Procedure: 2D Echo, Color  Doppler, Cardiac Doppler and Intracardiac            Opacification Agent (Both Spectral and Color Flow Doppler were            utilized during procedure). Indications:    Dyspnea R06.00  History:        Patient has no prior history of Echocardiogram examinations.                 Risk Factors:Hypertension and Diabetes.  Sonographer:    Sydnee Wilson RDCS Referring Phys: [id number] COURAGE EMOKPAE IMPRESSIONS  1. Left ventricular ejection fraction, by estimation, is 20 to 25%. The left ventricle has severely decreased function. The left ventricle demonstrates global hypokinesis. There is mild left ventricular hypertrophy. Left ventricular diastolic parameters  are indeterminate.  2. Right ventricular systolic function is mildly reduced. The right ventricular size is moderately enlarged. There is severely elevated pulmonary artery systolic pressure.  3. Left atrial size was moderately dilated.  4. Right atrial size was moderately dilated.  5. The mitral valve is abnormal. Moderate to severe mitral valve regurgitation. No evidence of mitral stenosis.  6. The tricuspid valve is abnormal. Tricuspid valve regurgitation is severe.  7. The aortic valve is  tricuspid. Aortic valve regurgitation is not visualized. No aortic stenosis is present.  8. The inferior vena cava is dilated in size with <50% respiratory variability, suggesting right atrial pressure of 15 mmHg. FINDINGS  Left Ventricle: Left ventricular ejection fraction, by estimation, is 20 to 25%. The left ventricle has severely decreased function. The left ventricle demonstrates global hypokinesis. Definity contrast agent was given IV to delineate the left ventricular endocardial borders. The left ventricular internal cavity size was normal in size. There is mild left ventricular hypertrophy. Left ventricular diastolic parameters are indeterminate. Right Ventricle: The right ventricular size is moderately enlarged. Right vetricular wall thickness was not well visualized.  Right ventricular systolic function is mildly reduced. There is severely elevated pulmonary artery systolic pressure. The tricuspid regurgitant velocity is 3.67 m/s, and with an assumed right atrial pressure of 15 mmHg, the estimated right ventricular systolic pressure is 68.9 mmHg. Left Atrium: Left atrial size was moderately dilated. Right Atrium: Right atrial size was moderately dilated. Pericardium: There is no evidence of pericardial effusion. Mitral Valve: The mitral valve is abnormal. Moderate to severe mitral valve regurgitation. No evidence of mitral valve stenosis. Tricuspid Valve: Systolic hepatic vein flow reversal consistent with severe TR. The tricuspid valve is abnormal. Tricuspid valve regurgitation is severe. No evidence of tricuspid stenosis. Aortic Valve: The aortic valve is tricuspid. Aortic valve regurgitation is not visualized. No aortic stenosis is present. Aortic valve mean gradient measures 1.9 mmHg. Aortic valve peak gradient measures 3.7 mmHg. Aortic valve area, by VTI measures 2.63 cm. Pulmonic Valve: The pulmonic valve was not well visualized. Pulmonic valve regurgitation is mild. No evidence of pulmonic stenosis. Aorta: The aortic root and ascending aorta are structurally normal, with no evidence of dilitation. Venous: The inferior vena cava is dilated in size with less than 50% respiratory variability, suggesting right atrial pressure of 15 mmHg. IAS/Shunts: No atrial level shunt detected by color flow Doppler.  LEFT VENTRICLE PLAX 2D LVIDd:         4.80 cm      Diastology LVIDs:         4.50 cm      LV e' medial:    8.05 cm/s LV PW:         1.20 cm      LV E/e' medial:  13.3 LV IVS:        1.20 cm      LV e' lateral:   9.68 cm/s LVOT diam:     2.10 cm      LV E/e' lateral: 11.1 LV SV:         35 LV SV Index:   20 LVOT Area:     3.46 cm  LV Volumes (MOD) LV vol d, MOD A2C: 126.0 ml LV vol d, MOD A4C: 110.0 ml LV vol s, MOD A2C: 89.6 ml LV vol s, MOD A4C: 82.9 ml LV SV MOD A2C:      36.4 ml LV SV MOD A4C:     110.0 ml LV SV MOD BP:      30.7 ml RIGHT VENTRICLE RV S prime:     9.03 cm/s TAPSE (M-mode): 1.2 cm LEFT ATRIUM              Index        RIGHT ATRIUM           Index LA diam:        4.70 cm  2.64 cm/m   RA Area:     21.70  cm LA Vol (A2C):   102.0 ml 57.25 ml/m  RA Volume:   72.90 ml  40.92 ml/m LA Vol (A4C):   70.7 ml  39.68 ml/m LA Biplane Vol: 85.2 ml  47.82 ml/m  AORTIC VALVE AV Area (Vmax):    2.07 cm AV Area (Vmean):   1.97 cm AV Area (VTI):     2.63 cm AV Vmax:           96.25 cm/s AV Vmean:          63.697 cm/s AV VTI:            0.135 m AV Peak Grad:      3.7 mmHg AV Mean Grad:      1.9 mmHg LVOT Vmax:         57.50 cm/s LVOT Vmean:        36.300 cm/s LVOT VTI:          0.102 m LVOT/AV VTI ratio: 0.76  AORTA Ao Root diam: 3.00 cm Ao Asc diam:  3.10 cm MITRAL VALVE                TRICUSPID VALVE MV Area (PHT): 4.89 cm     TR Peak grad:   53.9 mmHg MV Decel Time: 155 msec     TR Vmax:        367.00 cm/s MV E velocity: 107.00 cm/s MV A velocity: 49.80 cm/s   SHUNTS MV E/A ratio:  2.15         Systemic VTI:  0.10 m                             Systemic Diam: 2.10 cm Dorn Ross MD Electronically signed by Dorn Ross MD Signature Date/Time: 02/08/2024/12:52:42 PM    Final    US  Venous Img Lower Bilateral (DVT) Result Date: 02/07/2024 CLINICAL DATA:  Postop shortness of breath EXAM: Bilateral Lower Extremity Venous Doppler Ultrasound TECHNIQUE: Gray-scale sonography with compression, as well as color and duplex ultrasound, were performed to evaluate the deep venous system(s) from the level of the common femoral vein through the popliteal and proximal calf veins. COMPARISON:  None available FINDINGS: VENOUS Normal compressibility of the common femoral, femoral, and popliteal veins, as well as the visualized calf veins. Visualized portions of profunda femoral vein and great saphenous vein unremarkable. No filling defects to suggest DVT on grayscale or color Doppler  imaging. Doppler waveforms show normal direction of venous flow, normal respiratory plasticity and response to augmentation. OTHER None. Limitations: Limited visualization of calf veins due to presence of compression devices. IMPRESSION: No lower extremity DVT. Electronically Signed   By: Aliene Lloyd M.D.   On: 02/07/2024 16:56   DG Chest Port 1 View Result Date: 02/07/2024 CLINICAL DATA:  Hypoxia. EXAM: PORTABLE CHEST 1 VIEW COMPARISON:  Chest radiograph dated 02/20/2021. FINDINGS: There is cardiomegaly with vascular congestion. Left lung interstitial densities may represent mild congestion/edema. Atypical pneumonia is not excluded. No pleural effusion pneumothorax. No acute osseous pathology. IMPRESSION: 1. Cardiomegaly with vascular congestion. 2. Left lung interstitial densities may represent mild congestion/edema. Pneumonia is not excluded. Electronically Signed   By: Vanetta Chou M.D.   On: 02/07/2024 14:18   NM Hepato W/EF Result Date: 02/07/2024 CLINICAL DATA:  Right-sided abdominal pain for 1 day. Clinical concern for acalculous cholecystitis. EXAM: NUCLEAR MEDICINE HEPATOBILIARY IMAGING WITH GALLBLADDER EF TECHNIQUE: Sequential images of the abdomen were obtained out to 60 minutes following  intravenous administration of radiopharmaceutical. After slow intravenous infusion of 1.6 micrograms Cholecystokinin, gallbladder ejection fraction was determined. RADIOPHARMACEUTICALS:  5.1 mCi Tc-64m Choletec  IV COMPARISON:  Abdominal CT and ultrasound 02/06/2024. FINDINGS: Prompt uptake and biliary excretion of activity by the liver is seen. Gallbladder activity is visualized, consistent with patency of cystic duct. Biliary activity passes into small bowel, consistent with patent common bile duct. Calculated gallbladder ejection fraction is 15%. (At 60 min, normal ejection fraction is greater than 40% and less than 80%.) IMPRESSION: 1. The cystic and common bile ducts are patent. No evidence of acute  cholecystitis. 2. 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 Electronically Signed   By: Elsie Perone M.D.   On: 02/07/2024 10:48   US  Abdomen Limited RUQ (LIVER/GB) Result Date: 02/06/2024 CLINICAL DATA:  Right upper quadrant abdominal pain. EXAM: ULTRASOUND ABDOMEN LIMITED RIGHT UPPER QUADRANT COMPARISON:  CT abdomen pelvis dated 02/06/2024. FINDINGS: Gallbladder: No gallstone. The gallbladder wall is thickened and edematous measuring up to 13 mm. Positive sonographic Murphy's sign is reported. Common bile duct: Diameter: 4 mm Liver: There is diffuse increased liver echogenicity most commonly seen in the setting of fatty infiltration. Superimposed inflammation or fibrosis is not excluded. Clinical correlation is recommended. Portal vein is patent on color Doppler imaging with normal direction of blood flow towards the liver. Other: None. IMPRESSION: 1. No gallstone. Thickened gallbladder wall with reported sonographic Murphy's sign. Although findings may be related to underlying liver disease, an acalculous cholecystitis cannot be excluded. A hepatobiliary scintigraphy may provide better evaluation of the gallbladder if there is a high clinical concern for acute cholecystitis . 2. Fatty liver. Electronically Signed   By: Vanetta Chou M.D.   On: 02/06/2024 16:19   CT ABDOMEN PELVIS W CONTRAST Result Date: 02/06/2024 CLINICAL DATA:  Right-sided abdominal pain. EXAM: CT ABDOMEN AND PELVIS WITH CONTRAST TECHNIQUE: Multidetector CT imaging of the abdomen and pelvis was performed using the standard protocol following bolus administration of intravenous contrast. RADIATION DOSE REDUCTION: This exam was performed according to the departmental dose-optimization program which includes automated exposure control, adjustment of the mA and/or kV according to patient size and/or use of iterative reconstruction technique. CONTRAST:  80mL OMNIPAQUE  IOHEXOL  300  MG/ML  SOLN COMPARISON:  CT abdomen pelvis dated 07/28/2021. FINDINGS: Lower chest: The visualized lung bases are clear. No intra-abdominal free air or free fluid. Hepatobiliary: Fatty appearing liver. Slight heterogeneous enhancement of the liver parenchyma may represent hepatitis. Clinical correlation recommended. No biliary dilatation. No calcified gallstone. There is diffuse gallbladder wall thickening versus pericholecystic edema which may be related to underlying liver disease. Ultrasound may provide better evaluation if there is clinical concern for acute gallbladder pathology. Pancreas: Unremarkable. No pancreatic ductal dilatation or surrounding inflammatory changes. Spleen: Normal in size without focal abnormality. Adrenals/Urinary Tract: The right adrenal glands unremarkable. Mild left adrenal thickening/hyperplasia or adenoma. There is mild bilateral renal parenchyma atrophy. There is no hydronephrosis on either side. There is symmetric enhancement and excretion of contrast by both kidneys. The visualized ureters and urinary bladder appear unremarkable. Stomach/Bowel: There is sigmoid diverticulosis. There is no bowel obstruction or active inflammation. The appendix is normal. Vascular/Lymphatic: Mild aortoiliac atherosclerotic disease. The IVC is unremarkable. No portal venous gas. There is no adenopathy. Reproductive: The uterus is anteverted. No suspicious adnexal masses. Other: Mild diffuse subcutaneous edema Musculoskeletal: Severe arthritic changes of the hips. No acute osseous pathology. IMPRESSION: 1. Fatty liver with possible hepatitis. Clinical correlation is recommended. 2. Diffuse gallbladder  wall thickening versus pericholecystic edema which may be related to underlying liver disease. Ultrasound may provide better evaluation if there is clinical concern for acute gallbladder pathology. 3. Sigmoid diverticulosis. No bowel obstruction. Normal appendix. 4.  Aortic Atherosclerosis (ICD10-I70.0).  Electronically Signed   By: Vanetta Chou M.D.   On: 02/06/2024 15:46     Assessment and Plan:  1.Acute HFrEF -Jan 2026 echo: LVEF 20-25%, indet diastolic function, mild RV dysfunction, severe pulm HTN, mod BAE, mod tos severe MR, severe TR - acute hypoxia intra and postop lap chole, signs of fluid overload - history of cocaine, EtoH abuse possible etiology.   - she is on IV lasix  40mg  bid, just increased to tid dosing today for 3 total doses. by I/Os reported + 1.4 L yesterday. Weight down 1 lbs. Symptoms wise appears improved, sats 95% on RA and she denies any ongoing SOB. Labs are pending today - appears near euvolemic by exam, JVD but potentially misleading with her severe TR - would transition to lasix  40mg  just prn at discharge.  - renal function limits medical therapy. Avoid ACE/ARB/ARNI/MRA/SGLT2i at this time.  - start coreg 3.125mg  bid. Somewhat labile bp's, may consider hydral/nitrates pending bp's over time. Cocaine use but nonselective beta blocker would be reasonable.   - lap chole yesterday, there is no urgent indication to plan for inpatient ischemic testing. Reassess as outpatient timing. CKD though variable function over the last several would also be a factor on deciding on candidacy and timing for cath   2.Severe pulmonary HTN - in setting of left sided HF. Cocaine use, suspected OSA as she desats to 70s with sleeping at night - needs outpatient sleep study - potentially RHC in time.    3.CKD3B  I think potentially could be discharged tomorrow with close cards f/u   Signed, Alvan Carrier, MD  02/08/2024 1:01 PM     [1] No Known Allergies  "

## 2024-02-08 NOTE — Inpatient Diabetes Management (Addendum)
 Inpatient Diabetes Program Recommendations  AACE/ADA: New Consensus Statement on Inpatient Glycemic Control (2015)  Target Ranges:  Prepandial:   less than 140 mg/dL      Peak postprandial:   less than 180 mg/dL (1-2 hours)      Critically ill patients:  140 - 180 mg/dL   Lab Results  Component Value Date   GLUCAP 89 02/08/2024   HGBA1C >15.5 (H) 11/26/2023    Review of Glycemic Control  Latest Reference Range & Units 02/08/24 05:18 02/08/24 05:20 02/08/24 05:52 02/08/24 07:48  Glucose-Capillary 70 - 99 mg/dL 58 (L) 68 (L) 79 89  (L): Data is abnormally low  Diabetes history: DM2  Outpatient Diabetes medications:  Tesiba 8-15 units hs Novolog  8 units tid meal coverage  Current orders for Inpatient glycemic control: Semglee  6 units daily Novolog  0-9 units tid, 0-5 units hs D10 @ 30 ml/hr  Inpatient Diabetes Program Recommendations:    Noted basal insulin  decreased to 6 units today.  Might consider:  -Discontinue Semglee  as she is having persistent hypoglycemia.  D10 infusing at 30 ml/hr.    -decrease correction to Novolog  0-6 TID and 0-5 QHS  Thank you, Wyvonna Pinal, MSN, CDCES Diabetes Coordinator Inpatient Diabetes Program [phone] (team pager from 8a-5p)

## 2024-02-08 NOTE — Significant Event (Signed)
"  ° °      CROSS COVER NOTE  NAME: Megan Mullins MRN: [id number] DOB : [birth year] ATTENDING PHYSICIAN: Pearlean Manus, MD    Date of Service   02/08/2024   HPI/Events of Note   [facility] cross cover at [facility] recurrent hypoglycemia post cholecystectomy with heart failure history   Interventions   Assessment/Plan: Blood pressure (!) 142/109, pulse 88, temperature 98.1 F (36.7 C), temperature source Oral, resp. rate 16, height 5' (1.524 m), weight 81.3 kg, last menstrual period 07/18/2021, SpO2 95%.  Change d51/2 ns to D10 at 30 ml/h        Erminio LITTIE Cone NP [facility] Cover 7pm-7am - check amion for availability Pager [phone]  "

## 2024-02-08 NOTE — Progress Notes (Signed)
 Bipap order is PRN.  Not needed at this time; will continue to monitor.

## 2024-02-09 DIAGNOSIS — R109 Unspecified abdominal pain: Secondary | ICD-10-CM | POA: Diagnosis not present

## 2024-02-09 LAB — GLUCOSE, CAPILLARY
Glucose-Capillary: 113 mg/dL — ABNORMAL HIGH (ref 70–99)
Glucose-Capillary: 116 mg/dL — ABNORMAL HIGH (ref 70–99)
Glucose-Capillary: 143 mg/dL — ABNORMAL HIGH (ref 70–99)
Glucose-Capillary: 160 mg/dL — ABNORMAL HIGH (ref 70–99)
Glucose-Capillary: 67 mg/dL — ABNORMAL LOW (ref 70–99)

## 2024-02-09 LAB — BASIC METABOLIC PANEL WITH GFR
Anion gap: 10 (ref 5–15)
BUN: 29 mg/dL — ABNORMAL HIGH (ref 6–20)
CO2: 24 mmol/L (ref 22–32)
Calcium: 8.7 mg/dL — ABNORMAL LOW (ref 8.9–10.3)
Chloride: 104 mmol/L (ref 98–111)
Creatinine, Ser: 2.9 mg/dL — ABNORMAL HIGH (ref 0.44–1.00)
GFR, Estimated: 19 mL/min — ABNORMAL LOW
Glucose, Bld: 142 mg/dL — ABNORMAL HIGH (ref 70–99)
Potassium: 4.7 mmol/L (ref 3.5–5.1)
Sodium: 138 mmol/L (ref 135–145)

## 2024-02-09 LAB — PRO BRAIN NATRIURETIC PEPTIDE: Pro Brain Natriuretic Peptide: 3213 pg/mL — ABNORMAL HIGH

## 2024-02-09 LAB — TSH: TSH: 7.2 u[IU]/mL — ABNORMAL HIGH (ref 0.350–4.500)

## 2024-02-09 MED ORDER — CARVEDILOL 3.125 MG PO TABS: 3.1250 mg | ORAL_TABLET | Freq: Two times a day (BID) | ORAL | 5 refills | Status: DC

## 2024-02-09 MED ORDER — LEVOTHYROXINE SODIUM 50 MCG PO TABS: 50.0000 ug | ORAL_TABLET | Freq: Every day | ORAL | 11 refills | Status: AC

## 2024-02-09 MED ORDER — LANTUS 100 UNIT/ML ~~LOC~~ SOLN: 30.0000 [IU] | Freq: Every day | SUBCUTANEOUS | 11 refills | Status: AC

## 2024-02-09 MED ORDER — ACETAMINOPHEN 325 MG PO CAPS: 2.0000 | ORAL_CAPSULE | Freq: Four times a day (QID) | ORAL | Status: AC | PRN

## 2024-02-09 MED ORDER — AMLODIPINE BESYLATE 5 MG PO TABS: 5.0000 mg | ORAL_TABLET | Freq: Every day | ORAL | 5 refills | Status: AC

## 2024-02-09 MED ORDER — ATORVASTATIN CALCIUM 20 MG PO TABS: 20.0000 mg | ORAL_TABLET | Freq: Every day | ORAL | 3 refills | Status: AC

## 2024-02-09 MED ORDER — SENNOSIDES-DOCUSATE SODIUM 8.6-50 MG PO TABS: 2.0000 | ORAL_TABLET | Freq: Every day | ORAL | 1 refills | Status: AC

## 2024-02-09 NOTE — Progress Notes (Signed)
 [facility] Progress Note  2 Days Post-Op  Subjective: Patient seen and examined.  She is resting comfortably in bed.  She complains of some abdominal soreness and back pain.  She tolerated her diet without nausea and vomiting.  She has also had a bowel movement since surgery.  Objective: Vital signs in last 24 hours: Temp:  [97.4 F (36.3 C)-98.3 F (36.8 C)] 97.6 F (36.4 C) (01/24 0524) Pulse Rate:  [84-87] 85 (01/24 0524) Resp:  [16-20] 16 (01/24 0524) BP: (132-148)/(74-107) 141/106 (01/24 0524) SpO2:  [95 %-100 %] 96 % (01/24 0524) Weight:  [80.1 kg] 80.1 kg (01/24 0524) Last BM Date : 02/04/24  Intake/Output from previous day: 01/23 0701 - 01/24 0700 In: 1049.5 [P.O.:720; I.V.:229.5; IV Piggyback:100] Out: -  Intake/Output this shift: No intake/output data recorded.  General appearance: alert, cooperative, and no distress GI: Abdomen soft, nondistended, no percussion tenderness, nontender palpation; no rigidity, guarding, rebound tenderness; laparoscopic incision sites C/D/I with Dermabond in place  Lab Results:  Recent Labs    02/06/24 1343  WBC 5.3  HGB 13.7  HCT 41.4  PLT 383   BMET Recent Labs    02/08/24 1436 02/09/24 0354  NA 138 138  K 5.2* 4.7  CL 101 104  CO2 24 24  GLUCOSE 180* 142*  BUN 27* 29*  CREATININE 2.83* 2.90*  CALCIUM  8.9 8.7*   PT/INR Recent Labs    02/06/24 1343  LABPROT 13.1  INR 0.9    Studies/Results: ECHOCARDIOGRAM COMPLETE Result Date: 02/08/2024    ECHOCARDIOGRAM REPORT   Patient Name:   ANGINETTE ESPEJO Schlitt Date of Exam: 02/08/2024 Medical Rec #:  [id number]     Height:       60.0 in Accession #:    [id number]    Weight:       179.2 lb Date of Birth:  1970-09-04     BSA:          1.782 m Patient Age:    53 years      BP:           124/103 mmHg Patient Gender: F             HR:           84 bpm. Exam Location:  [facility] Procedure: 2D Echo, Color Doppler, Cardiac Doppler and Intracardiac             Opacification Agent (Both Spectral and Color Flow Doppler were            utilized during procedure). Indications:    Dyspnea R06.00  History:        Patient has no prior history of Echocardiogram examinations.                 Risk Factors:Hypertension and Diabetes.  Sonographer:    Sydnee Wilson RDCS Referring Phys: [id number] COURAGE EMOKPAE IMPRESSIONS  1. Left ventricular ejection fraction, by estimation, is 20 to 25%. The left ventricle has severely decreased function. The left ventricle demonstrates global hypokinesis. There is mild left ventricular hypertrophy. Left ventricular diastolic parameters  are indeterminate.  2. Right ventricular systolic function is mildly reduced. The right ventricular size is moderately enlarged. There is severely elevated pulmonary artery systolic pressure.  3. Left atrial size was moderately dilated.  4. Right atrial size was moderately dilated.  5. The mitral valve is abnormal. Moderate to severe mitral valve regurgitation. No evidence of mitral stenosis.  6. The tricuspid valve is abnormal. 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  7. The aortic valve is tricuspid. Aortic valve regurgitation is not visualized. No aortic stenosis is present.  8. The inferior vena cava is dilated in size with <50% respiratory variability, suggesting right atrial pressure of 15 mmHg. FINDINGS  Left Ventricle: Left ventricular ejection fraction, by estimation, is 20 to 25%. The left ventricle has severely decreased function. The left ventricle demonstrates global hypokinesis. Definity  contrast agent was given IV to delineate the left ventricular endocardial borders. The left ventricular internal cavity size was normal in size. There is mild left ventricular hypertrophy. Left ventricular diastolic parameters are indeterminate. Right Ventricle: The right ventricular size is moderately enlarged. Right vetricular wall thickness was not well visualized. Right ventricular systolic function is mildly  reduced. There is severely elevated pulmonary artery systolic pressure. The tricuspid regurgitant velocity is 3.67 m/s, and with an assumed right atrial pressure of 15 mmHg, the estimated right ventricular systolic pressure is 68.9 mmHg. Left Atrium: Left atrial size was moderately dilated. Right Atrium: Right atrial size was moderately dilated. Pericardium: There is no evidence of pericardial effusion. Mitral Valve: The mitral valve is abnormal. Moderate to severe mitral valve regurgitation. No evidence of mitral valve stenosis. Tricuspid Valve: Systolic hepatic vein flow reversal consistent with severe TR. The tricuspid valve is abnormal. Tricuspid valve regurgitation is severe. No evidence of tricuspid stenosis. Aortic Valve: The aortic valve is tricuspid. Aortic valve regurgitation is not visualized. No aortic stenosis is present. Aortic valve mean gradient measures 1.9 mmHg. Aortic valve peak gradient measures 3.7 mmHg. Aortic valve area, by VTI measures 2.63 cm. Pulmonic Valve: The pulmonic valve was not well visualized. Pulmonic valve regurgitation is mild. No evidence of pulmonic stenosis. Aorta: The aortic root and ascending aorta are structurally normal, with no evidence of dilitation. Venous: The inferior vena cava is dilated in size with less than 50% respiratory variability, suggesting right atrial pressure of 15 mmHg. IAS/Shunts: No atrial level shunt detected by color flow Doppler.  LEFT VENTRICLE PLAX 2D LVIDd:         4.80 cm      Diastology LVIDs:         4.50 cm      LV e' medial:    8.05 cm/s LV PW:         1.20 cm      LV E/e' medial:  13.3 LV IVS:        1.20 cm      LV e' lateral:   9.68 cm/s LVOT diam:     2.10 cm      LV E/e' lateral: 11.1 LV SV:         35 LV SV Index:   20 LVOT Area:     3.46 cm  LV Volumes (MOD) LV vol d, MOD A2C: 126.0 ml LV vol d, MOD A4C: 110.0 ml LV vol s, MOD A2C: 89.6 ml LV vol s, MOD A4C: 82.9 ml LV SV MOD A2C:     36.4 ml LV SV MOD A4C:     110.0 ml LV SV MOD  BP:      30.7 ml RIGHT VENTRICLE RV S prime:     9.03 cm/s TAPSE (M-mode): 1.2 cm LEFT ATRIUM              Index        RIGHT ATRIUM           Index LA diam:        4.70 cm  2.64  cm/m   RA Area:     21.70 cm LA Vol (A2C):   102.0 ml 57.25 ml/m  RA Volume:   72.90 ml  40.92 ml/m LA Vol (A4C):   70.7 ml  39.68 ml/m LA Biplane Vol: 85.2 ml  47.82 ml/m  AORTIC VALVE AV Area (Vmax):    2.07 cm AV Area (Vmean):   1.97 cm AV Area (VTI):     2.63 cm AV Vmax:           96.25 cm/s AV Vmean:          63.697 cm/s AV VTI:            0.135 m AV Peak Grad:      3.7 mmHg AV Mean Grad:      1.9 mmHg LVOT Vmax:         57.50 cm/s LVOT Vmean:        36.300 cm/s LVOT VTI:          0.102 m LVOT/AV VTI ratio: 0.76  AORTA Ao Root diam: 3.00 cm Ao Asc diam:  3.10 cm MITRAL VALVE                TRICUSPID VALVE MV Area (PHT): 4.89 cm     TR Peak grad:   53.9 mmHg MV Decel Time: 155 msec     TR Vmax:        367.00 cm/s MV E velocity: 107.00 cm/s MV A velocity: 49.80 cm/s   SHUNTS MV E/A ratio:  2.15         Systemic VTI:  0.10 m                             Systemic Diam: 2.10 cm Dorn Ross MD Electronically signed by Dorn Ross MD Signature Date/Time: 02/08/2024/12:52:42 PM    Final    US  Venous Img Lower Bilateral (DVT) Result Date: 02/07/2024 CLINICAL DATA:  Postop shortness of breath EXAM: Bilateral Lower Extremity Venous Doppler Ultrasound TECHNIQUE: Gray-scale sonography with compression, as well as color and duplex ultrasound, were performed to evaluate the deep venous system(s) from the level of the common femoral vein through the popliteal and proximal calf veins. COMPARISON:  None available FINDINGS: VENOUS Normal compressibility of the common femoral, femoral, and popliteal veins, as well as the visualized calf veins. Visualized portions of profunda femoral vein and great saphenous vein unremarkable. No filling defects to suggest DVT on grayscale or color Doppler imaging. Doppler waveforms show normal direction  of venous flow, normal respiratory plasticity and response to augmentation. OTHER None. Limitations: Limited visualization of calf veins due to presence of compression devices. IMPRESSION: No lower extremity DVT. Electronically Signed   By: Aliene Lloyd M.D.   On: 02/07/2024 16:56   DG Chest Port 1 View Result Date: 02/07/2024 CLINICAL DATA:  Hypoxia. EXAM: PORTABLE CHEST 1 VIEW COMPARISON:  Chest radiograph dated 02/20/2021. FINDINGS: There is cardiomegaly with vascular congestion. Left lung interstitial densities may represent mild congestion/edema. Atypical pneumonia is not excluded. No pleural effusion pneumothorax. No acute osseous pathology. IMPRESSION: 1. Cardiomegaly with vascular congestion. 2. Left lung interstitial densities may represent mild congestion/edema. Pneumonia is not excluded. Electronically Signed   By: Vanetta Chou M.D.   On: 02/07/2024 14:18    Anti-infectives: Anti-infectives (From admission, onward)    Start     Dose/Rate Route Frequency Ordered Stop   02/07/24 2000  cefTRIAXone  (ROCEPHIN ) 2 g in sodium chloride   0.9 % 100 mL IVPB        2 g 200 mL/hr over 30 Minutes Intravenous Every 24 hours 02/07/24 1510     02/07/24 1700  azithromycin  (ZITHROMAX ) tablet 500 mg        500 mg Oral Daily 02/07/24 1510 02/09/24 0807   02/07/24 1154  cefoTEtan  (CEFOTAN ) 2 g in sodium chloride  0.9 % 100 mL IVPB        2 g 200 mL/hr over 30 Minutes Intravenous On call to O.R. 02/07/24 1144 02/07/24 1520   02/07/24 1145  sodium chloride  0.9 % with cefoTEtan  (CEFOTAN ) ADS Med       Note to Pharmacy: Ulla Hurl S: cabinet override      02/07/24 1145 02/07/24 1233       Assessment/Plan:  Patient is a 54 year old female who was admitted with intractable abdominal pain and HIDA scan demonstrating biliary dyskinesia.  She is status post robotic assisted laparoscopic cholecystectomy on 1/22.   -From a postoperative surgical standpoint, patient is doing well.  Tolerating a diet,  pain controlled, and having bowel function -We discussed that any back pain related to her gallbladder should improve, however back pain related to other causes may not improve with removal of her gallbladder -Continue low-fat diet -PRN pain control and antiemetics -Patient with chronic constipation.  Bowel regimen ordered with Senokot-S, MiraLAX , and Dulcolax suppositories -If PE noted on VQ scan, okay for therapeutic anticoagulation from surgical standpoint -Patient stable for discharge from a surgical standpoint once medically cleared.  Patient's prescriptions already sent to pharmacy -I will call the patient in 2 weeks for follow-up -Appreciate hospitalist recommendations   LOS: 2 days    Linnie Delgrande A Brandin Dilday 02/09/2024  Note: Portions of this report may have been transcribed using voice recognition software. Every effort has been made to ensure accuracy; however, inadvertent computerized transcription errors may still be present.

## 2024-02-09 NOTE — Discharge Summary (Signed)
 "                                                                                  Megan Mullins, is a 54 y.o. female  DOB 02-27-1970  MRN [id number].  Admission date:  02/06/2024  Admitting Physician  Tully FORBES Carwin, MD  Discharge Date:  02/09/2024   Primary MD  Patient, No Pcp Per  Recommendations for primary care physician for things to follow:  1)Very Low-salt diet advised---Less than 2 gm of Sodium per day advised----ok to use Mrs DASH salt substitute instead of Salt 2)Weigh yourself daily, call if you gain more than 3 pounds in 1 day or more than 5 pounds in 1 week as your diuretic medications may need to be adjusted 3)Avoid ibuprofen /Advil /Aleve/Motrin Josefine Powders/Naproxen/BC powders/Meloxicam/Diclofenac/Indomethacin and other Nonsteroidal anti-inflammatory medications as these will make you more likely to bleed and can cause stomach ulcers, can also cause Kidney problems.  4)Repeat BMP Blood Test in 1 week  5)Repeat TSH --Thyroid  Test in 6 weeks  6)Complete Abstinence from Tobacco and Cocaine advised  Admission Diagnosis  Right upper quadrant pain [R10.11] Right sided abdominal pain [R10.9] Thickening of wall of gallbladder [K82.8]   Discharge Diagnosis  Right upper quadrant pain [R10.11] Right sided abdominal pain [R10.9] Thickening of wall of gallbladder [K82.8]    Principal Problem:   Right sided abdominal pain Active Problems:   Essential hypertension   CKD stage 3b, GFR 30-44 ml/min (HCC)   DM (diabetes mellitus) (HCC)   Biliary dyskinesia      Past Medical History:  Diagnosis Date   Diabetes mellitus without complication (HCC)    Hypertension     Past Surgical History:  Procedure Laterality Date   IRRIGATION AND DEBRIDEMENT OF NECROTIZING SOFT TISSUE INFECTION Right 04/23/2023   Procedure: IRRIGATION, DRAINAGE AND DEBRIDEMENT OF NECROTIZING SOFT TISSUE INFECTION RIGHT GROIN DOWN TO MUSCLE, 11CM X 12CM X 5CM;  Surgeon: Evonnie Dorothyann LABOR, DO;   Location: [facility];  Service: General;  Laterality: Right;   TUBAL LIGATION       HPI  from the history and physical done on the day of admission:   Patient coming from: Home   I have personally briefly reviewed patient's old medical records in [facility]   Chief Complaint: Abdominal pain   HPI: Megan Mullins is a 54 y.o. female with medical history significant for hypertension, diabetes mellitus, CKD 3b. Patient presented to the ED with complaints of pain in her right upper abdomen yesterday yesterday.  She reports she was constipated took a stool softener and had a bowel movement and pain subsequently started.  No vomiting, no diarrhea.  She has had this persistent pain since onset.  No urinary symptoms.   ED Course: Temperature 97.6.  Heart rate 97-102.  Respiratory rate 19-20.  Blood pressure systolic 140s to 839d.  O2 sats greater 96% on room air. Normal lipase less than 10, WBC 5.3. Mildly elevated ALP 214, AST 42. CT abdomen pelvis with contrast- Fatty liver with possible hepatitis. Diffuse gallbladder wall thickening versus pericholecystic edema which may be related to underlying liver disease.  Ultrasound recommended. Subsequent RUQ ultrasound-no gallstone.  Thickened gallbladder  wall with reported sonographic Murphy sign.  Acalculous cholecystitis not excluded.  HIDA recommended. EDP talked to general surgeon Dr. Evonnie.  Will see in consult, agreed with HIDA.     Review of Systems: As per HPI all other systems reviewed and negative.   Hospital Course:     Brief Narrative:  54 y.o. female with medical history significant for hypertension, diabetes mellitus, CKD 3b admitted on 02/06/2024 with concerns for  Biliary dyskinesia  and possible acalculous cholecystitis. - Patient underwent lap chole on 02/05/2024 developed acute hypoxic respiratory failure postoperatively     -Assessment and Plan: 1) Biliary dyskinesia/intractable abdominal pain--- -CT abdomen and pelvis,  right upper quadrant ultrasound and HIDA scan noted  status post lap chole on 02/07/24 -- Postoperative management per surgical team   2)Acute hypoxic respiratory failure----due to CHF  --chest x-ray and clinical exam on 02/06/2018 suggested possible volume overload/pulmonary venous congestion Much improved with IV Lasix  --Patient has been weaned off oxygen  at this time hypoxia is resolved - Pneumonia deemed to be less likely so okay to discontinue Rocephin  / azithromycin  after 02/08/2024 dose -Lower extremity venous Dopplers negative for DVT - VQ scan negative for PE   3) acute systolic dysfunction CHF exacerbation/HFrEF-- -echo from 02/07/2021 showed EF of 20 to 25%, with global hypokinesis of the left ventricle, mild LVH, moderately dilated left and right atria, severe pulmonary hypertension, moderate to severe MR, No AS, No MS --No prior echo for comparison Much improved with IV Lasix ,  -- Cardiology consult appreciated,  -per cardiologist no need to schedule Lasix ,  May use Lasix  as needed in the future -Discharge on Coreg  -May add isosorbide/hydralazine  combo as outpatient Avoid ACEI/ARB/ARNI/MRA/SGLT2i  due to renal concerns -- Daily weight and fluid input and output monitor   4) OSA/Class 2 obesity--- patient clearly has obstructive sleep apnea -- She desaturates into the 70s when she falls asleep - She will need outpatient sleep study postdischarge -Low calorie diet, portion control and increase physical activity discussed with patient after acute medical problems resolved -Body mass index is 35 kg/m.   5)Fatty Liver--AST/ALT unremarkable - Alk phos elevated T. bili WNL -- Fasting lipid profile unremarkable with LDL of 41 triglycerides of 94 HDL of 67, viral hepatitis profile negative -- diet and lifestyle modifications as outlined in #3 above   6)CKD stage -3B   -renal function currently close to baseline -- renally adjust medications, avoid nephrotoxic agents / dehydration   / hypotension   7)HTN--stable, continue amlodipine   and Coreg ,  --may add isosorbide/hydralazine  combo as outpatient   8)DM2 --A1c greater than 15.5 consistent with uncontrolled DM with hyperglycemia - Resume insulin  therapy-- --follow-up with PCP for further adjustments of her regimen   9) polysubstance abuse--including cocaine, alcohol and tobacco --Patient declines referral to drug rehab programs - 10) severe pulmonary hypertension--- in the setting of OSA and CHF as above --- May need RHC as outpatient  Disposition: The patient is from: Home              Anticipated d/c is to: Home  Discharge Condition: stable  Follow UP   Follow-up Information     Pappayliou, Dorothyann A, DO. Call.   Specialty: General Surgery Why: I will call you for phone follow-up in 2 weeks Contact information: [address] [place] [facility] [address] [phone]         Johnson Laymon HERO, PA-C Follow up on 02/22/2024.   Specialties: Cardiology, Cardiology Why: Cardiology Follow-up on 02/22/2024 at 3:30 PM. Contact  information: [address] [place] [address] [phone]                 Consults obtained - Cardiology  Diet and Activity recommendation:  As advised  Discharge Instructions    Discharge Instructions     Call MD for:  difficulty breathing, headache or visual disturbances   Complete by: As directed    Call MD for:  persistant dizziness or light-headedness   Complete by: As directed    Call MD for:  persistant nausea and vomiting   Complete by: As directed    Call MD for:  persistant nausea and vomiting   Complete by: As directed    Call MD for:  redness, tenderness, or signs of infection (pain, swelling, redness, odor or green/yellow discharge around incision site)   Complete by: As directed    Call MD for:  severe uncontrolled pain   Complete by: As directed    Call MD for:  temperature >100.4   Complete by: As directed    Call MD for:  temperature  >100.4   Complete by: As directed    Diet - low sodium heart healthy   Complete by: As directed    Diet Carb Modified   Complete by: As directed    Discharge instructions   Complete by: As directed    1)Very Low-salt diet advised---Less than 2 gm of Sodium per day advised----ok to use Mrs DASH salt substitute instead of Salt 2)Weigh yourself daily, call if you gain more than 3 pounds in 1 day or more than 5 pounds in 1 week as your diuretic medications may need to be adjusted 3)Avoid ibuprofen /Advil /Aleve/Motrin /Goody Powders/Naproxen/BC powders/Meloxicam/Diclofenac/Indomethacin and other Nonsteroidal anti-inflammatory medications as these will make you more likely to bleed and can cause stomach ulcers, can also cause Kidney problems.  4)Repeat BMP Blood Test in 1 week  5)Repeat TSH --Thyroid  Test in 6 weeks  6)Complete Abstinence from Tobacco and Cocaine advised   Increase activity slowly   Complete by: As directed    Increase activity slowly   Complete by: As directed         Discharge Medications     Allergies as of 02/09/2024   No Known Allergies      Medication List     STOP taking these medications    losartan -hydrochlorothiazide 50-12.5 MG tablet Commonly known as: HYZAAR       TAKE these medications    Acetaminophen  325 MG Caps Take 2 capsules by mouth every 6 (six) hours as needed.   amLODipine  5 MG tablet Commonly known as: NORVASC  Take 1 tablet (5 mg total) by mouth daily.   atorvastatin  20 MG tablet Commonly known as: LIPITOR Take 1 tablet (20 mg total) by mouth daily.   carvedilol  3.125 MG tablet Commonly known as: COREG  Take 1 tablet (3.125 mg total) by mouth 2 (two) times daily with a meal.   Insulin  Syringe-Needle U-100 25G X 5/8 1 ML Misc 1 each by Does not apply route 3 (three) times daily. May dispense any manufacturer covered by patient's insurance.   Lancets Misc 1 each by Does not apply route 3 (three) times daily. Use as directed to  check blood sugar. May dispense any manufacturer covered by patient's insurance and fits patient's device.   Lantus  100 UNIT/ML injection Generic drug: insulin  glargine Inject 0.3 mLs (30 Units total) into the skin at bedtime.   levothyroxine  50 MCG tablet Commonly known as: Synthroid  Take 1 tablet (50 mcg  total) by mouth daily.   ondansetron  4 MG tablet Commonly known as: ZOFRAN  Take 1 tablet (4 mg total) by mouth every 8 (eight) hours as needed for nausea or vomiting.   oxyCODONE  5 MG immediate release tablet Commonly known as: Roxicodone  Take 1 tablet (5 mg total) by mouth every 6 (six) hours as needed.   senna-docusate 8.6-50 MG tablet Commonly known as: Senokot-S Take 2 tablets by mouth at bedtime.       Major procedures and Radiology Reports - PLEASE review detailed and final reports for all details, in brief -   NM Pulmonary Perfusion Result Date: 02/09/2024 EXAM: NM Lung Perfusion Scan. CLINICAL HISTORY: Dyspnea on exertion (DOE) Shortness of breath on exertion. TECHNIQUE: Radiolabeled MAA was administered intravenously and planar images of the lungs were obtained in multiple projections. RADIOPHARMACEUTICAL: 4 millicurie TECHNETIUM TO 81M ALBUMIN  AGGREGATED COMPARISON: Chest radiograph 01/06/2025. FINDINGS: PERFUSION: No wedge-shaped peripheral perfusion defect within left or right lung to suggest acute pulmonary embolism. Normal perfusion pattern. IMPRESSION: 1. No perfusion defects to indicate pulmonary embolism. Electronically signed by: Norleen Boxer MD 02/09/2024 02:11 PM EST RP Workstation: [id number]   ECHOCARDIOGRAM COMPLETE Result Date: 02/08/2024    ECHOCARDIOGRAM REPORT   Patient Name:   Megan Mullins Date of Exam: 02/08/2024 Medical Rec #:  [id number]     Height:       60.0 in Accession #:    [id number]    Weight:       179.2 lb Date of Birth:  Nov 30, 1970     BSA:          1.782 m Patient Age:    53 years      BP:           124/103 mmHg Patient Gender: F              HR:           84 bpm. Exam Location:  [facility] Procedure: 2D Echo, Color Doppler, Cardiac Doppler and Intracardiac            Opacification Agent (Both Spectral and Color Flow Doppler were            utilized during procedure). Indications:    Dyspnea R06.00  History:        Patient has no prior history of Echocardiogram examinations.                 Risk Factors:Hypertension and Diabetes.  Sonographer:    Sydnee Wilson RDCS Referring Phys: [id number] Takeysha Bonk IMPRESSIONS  1. Left ventricular ejection fraction, by estimation, is 20 to 25%. The left ventricle has severely decreased function. The left ventricle demonstrates global hypokinesis. There is mild left ventricular hypertrophy. Left ventricular diastolic parameters  are indeterminate.  2. Right ventricular systolic function is mildly reduced. The right ventricular size is moderately enlarged. There is severely elevated pulmonary artery systolic pressure.  3. Left atrial size was moderately dilated.  4. Right atrial size was moderately dilated.  5. The mitral valve is abnormal. Moderate to severe mitral valve regurgitation. No evidence of mitral stenosis.  6. The tricuspid valve is abnormal. Tricuspid valve regurgitation is severe.  7. The aortic valve is tricuspid. Aortic valve regurgitation is not visualized. No aortic stenosis is present.  8. The inferior vena cava is dilated in size with <50% respiratory variability, suggesting right atrial pressure of 15 mmHg. FINDINGS  Left Ventricle: Left ventricular ejection fraction, by estimation, is 20 to 25%. The left ventricle has  severely decreased function. The left ventricle demonstrates global hypokinesis. Definity  contrast agent was given IV to delineate the left ventricular endocardial borders. The left ventricular internal cavity size was normal in size. There is mild left ventricular hypertrophy. Left ventricular diastolic parameters are indeterminate. Right Ventricle: The right ventricular size is  moderately enlarged. Right vetricular wall thickness was not well visualized. Right ventricular systolic function is mildly reduced. There is severely elevated pulmonary artery systolic pressure. The tricuspid regurgitant velocity is 3.67 m/s, and with an assumed right atrial pressure of 15 mmHg, the estimated right ventricular systolic pressure is 68.9 mmHg. Left Atrium: Left atrial size was moderately dilated. Right Atrium: Right atrial size was moderately dilated. Pericardium: There is no evidence of pericardial effusion. Mitral Valve: The mitral valve is abnormal. Moderate to severe mitral valve regurgitation. No evidence of mitral valve stenosis. Tricuspid Valve: Systolic hepatic vein flow reversal consistent with severe TR. The tricuspid valve is abnormal. Tricuspid valve regurgitation is severe. No evidence of tricuspid stenosis. Aortic Valve: The aortic valve is tricuspid. Aortic valve regurgitation is not visualized. No aortic stenosis is present. Aortic valve mean gradient measures 1.9 mmHg. Aortic valve peak gradient measures 3.7 mmHg. Aortic valve area, by VTI measures 2.63 cm. Pulmonic Valve: The pulmonic valve was not well visualized. Pulmonic valve regurgitation is mild. No evidence of pulmonic stenosis. Aorta: The aortic root and ascending aorta are structurally normal, with no evidence of dilitation. Venous: The inferior vena cava is dilated in size with less than 50% respiratory variability, suggesting right atrial pressure of 15 mmHg. IAS/Shunts: No atrial level shunt detected by color flow Doppler.  LEFT VENTRICLE PLAX 2D LVIDd:         4.80 cm      Diastology LVIDs:         4.50 cm      LV e' medial:    8.05 cm/s LV PW:         1.20 cm      LV E/e' medial:  13.3 LV IVS:        1.20 cm      LV e' lateral:   9.68 cm/s LVOT diam:     2.10 cm      LV E/e' lateral: 11.1 LV SV:         35 LV SV Index:   20 LVOT Area:     3.46 cm  LV Volumes (MOD) LV vol d, MOD A2C: 126.0 ml LV vol d, MOD A4C: 110.0  ml LV vol s, MOD A2C: 89.6 ml LV vol s, MOD A4C: 82.9 ml LV SV MOD A2C:     36.4 ml LV SV MOD A4C:     110.0 ml LV SV MOD BP:      30.7 ml RIGHT VENTRICLE RV S prime:     9.03 cm/s TAPSE (M-mode): 1.2 cm LEFT ATRIUM              Index        RIGHT ATRIUM           Index LA diam:        4.70 cm  2.64 cm/m   RA Area:     21.70 cm LA Vol (A2C):   102.0 ml 57.25 ml/m  RA Volume:   72.90 ml  40.92 ml/m LA Vol (A4C):   70.7 ml  39.68 ml/m LA Biplane Vol: 85.2 ml  47.82 ml/m  AORTIC VALVE AV Area (Vmax):    2.07 cm AV Area (  Vmean):   1.97 cm AV Area (VTI):     2.63 cm AV Vmax:           96.25 cm/s AV Vmean:          63.697 cm/s AV VTI:            0.135 m AV Peak Grad:      3.7 mmHg AV Mean Grad:      1.9 mmHg LVOT Vmax:         57.50 cm/s LVOT Vmean:        36.300 cm/s LVOT VTI:          0.102 m LVOT/AV VTI ratio: 0.76  AORTA Ao Root diam: 3.00 cm Ao Asc diam:  3.10 cm MITRAL VALVE                TRICUSPID VALVE MV Area (PHT): 4.89 cm     TR Peak grad:   53.9 mmHg MV Decel Time: 155 msec     TR Vmax:        367.00 cm/s MV E velocity: 107.00 cm/s MV A velocity: 49.80 cm/s   SHUNTS MV E/A ratio:  2.15         Systemic VTI:  0.10 m                             Systemic Diam: 2.10 cm Dorn Ross MD Electronically signed by Dorn Ross MD Signature Date/Time: 02/08/2024/12:52:42 PM    Final    US  Venous Img Lower Bilateral (DVT) Result Date: 02/07/2024 CLINICAL DATA:  Postop shortness of breath EXAM: Bilateral Lower Extremity Venous Doppler Ultrasound TECHNIQUE: Gray-scale sonography with compression, as well as color and duplex ultrasound, were performed to evaluate the deep venous system(s) from the level of the common femoral vein through the popliteal and proximal calf veins. COMPARISON:  None available FINDINGS: VENOUS Normal compressibility of the common femoral, femoral, and popliteal veins, as well as the visualized calf veins. Visualized portions of profunda femoral vein and great saphenous vein  unremarkable. No filling defects to suggest DVT on grayscale or color Doppler imaging. Doppler waveforms show normal direction of venous flow, normal respiratory plasticity and response to augmentation. OTHER None. Limitations: Limited visualization of calf veins due to presence of compression devices. IMPRESSION: No lower extremity DVT. Electronically Signed   By: Aliene Lloyd M.D.   On: 02/07/2024 16:56   DG Chest Port 1 View Result Date: 02/07/2024 CLINICAL DATA:  Hypoxia. EXAM: PORTABLE CHEST 1 VIEW COMPARISON:  Chest radiograph dated 02/20/2021. FINDINGS: There is cardiomegaly with vascular congestion. Left lung interstitial densities may represent mild congestion/edema. Atypical pneumonia is not excluded. No pleural effusion pneumothorax. No acute osseous pathology. IMPRESSION: 1. Cardiomegaly with vascular congestion. 2. Left lung interstitial densities may represent mild congestion/edema. Pneumonia is not excluded. Electronically Signed   By: Vanetta Chou M.D.   On: 02/07/2024 14:18   NM Hepato W/EF Result Date: 02/07/2024 CLINICAL DATA:  Right-sided abdominal pain for 1 day. Clinical concern for acalculous cholecystitis. EXAM: NUCLEAR MEDICINE HEPATOBILIARY IMAGING WITH GALLBLADDER EF TECHNIQUE: Sequential images of the abdomen were obtained out to 60 minutes following intravenous administration of radiopharmaceutical. After slow intravenous infusion of 1.6 micrograms Cholecystokinin, gallbladder ejection fraction was determined. RADIOPHARMACEUTICALS:  5.1 mCi Tc-38m Choletec  IV COMPARISON:  Abdominal CT and ultrasound 02/06/2024. FINDINGS: Prompt uptake and biliary excretion of activity by the liver is seen. Gallbladder activity is visualized, consistent with patency  of cystic duct. Biliary activity passes into small bowel, consistent with patent common bile duct. Calculated gallbladder ejection fraction is 15%. (At 60 min, normal ejection fraction is greater than 40% and less than 80%.)  IMPRESSION: 1. The cystic and common bile ducts are patent. No evidence of acute cholecystitis. 2. 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 Electronically Signed   By: Elsie Perone M.D.   On: 02/07/2024 10:48   US  Abdomen Limited RUQ (LIVER/GB) Result Date: 02/06/2024 CLINICAL DATA:  Right upper quadrant abdominal pain. EXAM: ULTRASOUND ABDOMEN LIMITED RIGHT UPPER QUADRANT COMPARISON:  CT abdomen pelvis dated 02/06/2024. FINDINGS: Gallbladder: No gallstone. The gallbladder wall is thickened and edematous measuring up to 13 mm. Positive sonographic Murphy's sign is reported. Common bile duct: Diameter: 4 mm Liver: There is diffuse increased liver echogenicity most commonly seen in the setting of fatty infiltration. Superimposed inflammation or fibrosis is not excluded. Clinical correlation is recommended. Portal vein is patent on color Doppler imaging with normal direction of blood flow towards the liver. Other: None. IMPRESSION: 1. No gallstone. Thickened gallbladder wall with reported sonographic Murphy's sign. Although findings may be related to underlying liver disease, an acalculous cholecystitis cannot be excluded. A hepatobiliary scintigraphy may provide better evaluation of the gallbladder if there is a high clinical concern for acute cholecystitis . 2. Fatty liver. Electronically Signed   By: Vanetta Chou M.D.   On: 02/06/2024 16:19   CT ABDOMEN PELVIS W CONTRAST Result Date: 02/06/2024 CLINICAL DATA:  Right-sided abdominal pain. EXAM: CT ABDOMEN AND PELVIS WITH CONTRAST TECHNIQUE: Multidetector CT imaging of the abdomen and pelvis was performed using the standard protocol following bolus administration of intravenous contrast. RADIATION DOSE REDUCTION: This exam was performed according to the departmental dose-optimization program which includes automated exposure control, adjustment of the mA and/or kV according to patient size and/or  use of iterative reconstruction technique. CONTRAST:  80mL OMNIPAQUE  IOHEXOL  300 MG/ML  SOLN COMPARISON:  CT abdomen pelvis dated 07/28/2021. FINDINGS: Lower chest: The visualized lung bases are clear. No intra-abdominal free air or free fluid. Hepatobiliary: Fatty appearing liver. Slight heterogeneous enhancement of the liver parenchyma may represent hepatitis. Clinical correlation recommended. No biliary dilatation. No calcified gallstone. There is diffuse gallbladder wall thickening versus pericholecystic edema which may be related to underlying liver disease. Ultrasound may provide better evaluation if there is clinical concern for acute gallbladder pathology. Pancreas: Unremarkable. No pancreatic ductal dilatation or surrounding inflammatory changes. Spleen: Normal in size without focal abnormality. Adrenals/Urinary Tract: The right adrenal glands unremarkable. Mild left adrenal thickening/hyperplasia or adenoma. There is mild bilateral renal parenchyma atrophy. There is no hydronephrosis on either side. There is symmetric enhancement and excretion of contrast by both kidneys. The visualized ureters and urinary bladder appear unremarkable. Stomach/Bowel: There is sigmoid diverticulosis. There is no bowel obstruction or active inflammation. The appendix is normal. Vascular/Lymphatic: Mild aortoiliac atherosclerotic disease. The IVC is unremarkable. No portal venous gas. There is no adenopathy. Reproductive: The uterus is anteverted. No suspicious adnexal masses. Other: Mild diffuse subcutaneous edema Musculoskeletal: Severe arthritic changes of the hips. No acute osseous pathology. IMPRESSION: 1. Fatty liver with possible hepatitis. Clinical correlation is recommended. 2. Diffuse gallbladder wall thickening versus pericholecystic edema which may be related to underlying liver disease. Ultrasound may provide better evaluation if there is clinical concern for acute gallbladder pathology. 3. Sigmoid diverticulosis.  No bowel obstruction. Normal appendix. 4.  Aortic Atherosclerosis (ICD10-I70.0). Electronically Signed   By: Vanetta Chou HERO.D.  On: 02/06/2024 15:46   Micro Results  Recent Results (from the past 240 hours)  MRSA Next Gen by PCR, Nasal     Status: None   Collection Time: 02/07/24  3:19 PM   Specimen: Nasal Mucosa; Nasal Swab  Result Value Ref Range Status   MRSA by PCR Next Gen NOT DETECTED NOT DETECTED Final    Comment: (NOTE) The GeneXpert MRSA Assay (FDA approved for NASAL specimens only), is one component of a comprehensive MRSA colonization surveillance program. It is not intended to diagnose MRSA infection nor to guide or monitor treatment for MRSA infections. Test performance is not FDA approved in patients less than 67 years old. Performed at [facility] [facility], [address] [place], [place] [address]     Today   Subjective    Megan Mullins today has no new complaint       Patient has been seen and examined prior to discharge   Objective   Blood pressure (!) 141/106, pulse 85, temperature 97.6 F (36.4 C), temperature source Oral, resp. rate 16, height 5' (1.524 m), weight 80.1 kg, last menstrual period 07/18/2021, SpO2 96%.   Intake/Output Summary (Last 24 hours) at 02/09/2024 1459 Last data filed at 02/09/2024 0524 Gross per 24 hour  Intake 820 ml  Output --  Net 820 ml   Exam Gen:- Awake Alert, no acute distress  HEENT:- [place].AT, No sclera icterus Neck-Supple Neck,No JVD,.  Lungs-  -improved air movement, no wheezing CV- S1, S2 normal, regular, 3/6 SM Abd-  +ve B.Sounds, Abd Soft, postop wounds are clean dry and intact, no significant tenderness    Extremity/Skin:- No  edema,   good pulses Psych-affect is appropriate, oriented x3 Neuro-no new focal deficits, no tremors    Data Review   CBC w Diff:  Lab Results  Component Value Date   WBC 5.3 02/06/2024   HGB 13.7 02/06/2024   HCT 41.4 02/06/2024   PLT 383 02/06/2024   LYMPHOPCT 30 02/06/2024    MONOPCT 4 02/06/2024   EOSPCT 0 02/06/2024   BASOPCT 1 02/06/2024   CMP:  Lab Results  Component Value Date   NA 138 02/09/2024   K 4.7 02/09/2024   CL 104 02/09/2024   CO2 24 02/09/2024   BUN 29 (H) 02/09/2024   CREATININE 2.90 (H) 02/09/2024   PROT 6.3 (L) 02/07/2024   ALBUMIN  3.2 (L) 02/07/2024   BILITOT 0.4 02/07/2024   ALKPHOS 180 (H) 02/07/2024   AST 33 02/07/2024   ALT 33 02/07/2024   Total Discharge time is about 33 minutes  Rendall Carwin M.D on 02/09/2024 at 2:59 PM  Go to [email or web address] -  for contact info  [facility] - Office  [phone]   "

## 2024-02-09 NOTE — Plan of Care (Signed)
" °  Problem: Education: Goal: Knowledge of General Education information will improve Description: Including pain rating scale, medication(s)/side effects and non-pharmacologic comfort measures Outcome: Progressing   Problem: Health Behavior/Discharge Planning: Goal: Ability to manage health-related needs will improve Outcome: Progressing   Problem: Clinical Measurements: Goal: Ability to maintain clinical measurements within normal limits will improve Outcome: Progressing Goal: Will remain free from infection Outcome: Progressing Goal: Diagnostic test results will improve Outcome: Progressing Goal: Respiratory complications will improve Outcome: Progressing Goal: Cardiovascular complication will be avoided Outcome: Progressing   Problem: Activity: Goal: Risk for activity intolerance will decrease Outcome: Progressing   Problem: Nutrition: Goal: Adequate nutrition will be maintained Outcome: Progressing   Problem: Coping: Goal: Level of anxiety will decrease Outcome: Progressing   Problem: Elimination: Goal: Will not experience complications related to bowel motility Outcome: Progressing Goal: Will not experience complications related to urinary retention Outcome: Progressing   Problem: Pain Managment: Goal: General experience of comfort will improve and/or be controlled Outcome: Progressing   Problem: Safety: Goal: Ability to remain free from injury will improve Outcome: Progressing   Problem: Skin Integrity: Goal: Risk for impaired skin integrity will decrease Outcome: Progressing   Problem: Education: Goal: Ability to describe self-care measures that may prevent or decrease complications (Diabetes Survival Skills Education) will improve Outcome: Progressing Goal: Individualized Educational Video(s) Outcome: Progressing   Problem: Coping: Goal: Ability to adjust to condition or change in health will improve Outcome: Progressing   Problem: Fluid  Volume: Goal: Ability to maintain a balanced intake and output will improve Outcome: Progressing   Problem: Health Behavior/Discharge Planning: Goal: Ability to identify and utilize available resources and services will improve Outcome: Progressing Goal: Ability to manage health-related needs will improve Outcome: Progressing   Problem: Metabolic: Goal: Ability to maintain appropriate glucose levels will improve Outcome: Progressing   Problem: Nutritional: Goal: Maintenance of adequate nutrition will improve Outcome: Progressing Goal: Progress toward achieving an optimal weight will improve Outcome: Progressing   Problem: Skin Integrity: Goal: Risk for impaired skin integrity will decrease Outcome: Progressing   Problem: Tissue Perfusion: Goal: Adequacy of tissue perfusion will improve Outcome: Progressing   Problem: Education: Goal: Knowledge of the prescribed therapeutic regimen will improve Outcome: Progressing   Problem: Bowel/Gastric: Goal: Gastrointestinal status for postoperative course will improve Outcome: Progressing   Problem: Cardiac: Goal: Ability to maintain an adequate cardiac output Outcome: Progressing Goal: Will show no evidence of cardiac arrhythmias Outcome: Progressing   Problem: Nutritional: Goal: Will attain and maintain optimal nutritional status Outcome: Progressing   Problem: Neurological: Goal: Will regain or maintain usual level of consciousness Outcome: Progressing   Problem: Clinical Measurements: Goal: Ability to maintain clinical measurements within normal limits Outcome: Progressing Goal: Postoperative complications will be avoided or minimized Outcome: Progressing   Problem: Respiratory: Goal: Will regain and/or maintain adequate ventilation Outcome: Progressing Goal: Respiratory status will improve Outcome: Progressing   Problem: Skin Integrity: Goal: Demonstrates signs of wound healing without infection Outcome:  Progressing   Problem: Urinary Elimination: Goal: Will remain free from infection Outcome: Progressing Goal: Ability to achieve and maintain adequate urine output Outcome: Progressing   "

## 2024-02-09 NOTE — Progress Notes (Signed)
 1.Acute HFrEF -Jan 2026 echo: LVEF 20-25%, indet diastolic function, mild RV dysfunction, severe pulm HTN, mod BAE, mod tos severe MR, severe TR - acute hypoxia intra and postop lap chole, signs of fluid overload - history of cocaine, EtoH abuse possible etiology.    - she received IV lasix  40mg  x 2doses yesterday. I/Os incomplete, weight is down 4 lbs over the last 48 hrs. Uptrend in Cr but within prior range, no additional diuretics today. Symptoms had resolved yesterday - would transition to lasix  40mg  just prn at discharge.    - renal function limits medical therapy. Avoid ACE/ARB/ARNI/MRA/SGLT2i at this time.  - start coreg  3.125mg  bid. Cocaine use but nonselective beta blocker would be reasonable.  - just started coreg , bp's up and down, would monitor over time and consider hydral/nitrates at f/u pending bp's.    - lap chole this admit, there is no urgent indication to plan for inpatient ischemic testing. Reassess as outpatient timing. CKD though variable function over the last several would also be a factor on deciding on candidacy and timing for cath     2.Severe pulmonary HTN - in setting of left sided HF. Cocaine use, suspected OSA as she desats to 70s with sleeping at night - needs outpatient sleep study - potentially RHC in time.      3.CKD3B    Ok for discharge from cardiac standpoint, she has f/u scheduled 03/03/24. We will sign off inpatient care.    Dorn Ross MD

## 2024-02-18 ENCOUNTER — Emergency Department (HOSPITAL_COMMUNITY): Admission: EM | Admit: 2024-02-18 | Discharge: 2024-02-18 | Disposition: A | Discharge status: Home / Self Care

## 2024-02-18 ENCOUNTER — Other Ambulatory Visit: Payer: Self-pay

## 2024-02-18 DIAGNOSIS — B029 Zoster without complications: Secondary | ICD-10-CM | POA: Insufficient documentation

## 2024-02-18 DIAGNOSIS — Z794 Long term (current) use of insulin: Secondary | ICD-10-CM | POA: Insufficient documentation

## 2024-02-18 LAB — COMPREHENSIVE METABOLIC PANEL WITH GFR
ALT: 33 U/L (ref 0–44)
AST: 30 U/L (ref 15–41)
Albumin: 3.4 g/dL — ABNORMAL LOW (ref 3.5–5.0)
Alkaline Phosphatase: 204 U/L — ABNORMAL HIGH (ref 38–126)
Anion gap: 17 — ABNORMAL HIGH (ref 5–15)
BUN: 15 mg/dL (ref 6–20)
CO2: 23 mmol/L (ref 22–32)
Calcium: 8.9 mg/dL (ref 8.9–10.3)
Chloride: 101 mmol/L (ref 98–111)
Creatinine, Ser: 1.92 mg/dL — ABNORMAL HIGH (ref 0.44–1.00)
GFR, Estimated: 31 mL/min — ABNORMAL LOW
Glucose, Bld: 257 mg/dL — ABNORMAL HIGH (ref 70–99)
Potassium: 4.5 mmol/L (ref 3.5–5.1)
Sodium: 142 mmol/L (ref 135–145)
Total Bilirubin: 0.4 mg/dL (ref 0.0–1.2)
Total Protein: 7.3 g/dL (ref 6.5–8.1)

## 2024-02-18 LAB — CBC WITH DIFFERENTIAL/PLATELET
Abs Immature Granulocytes: 0.02 10*3/uL (ref 0.00–0.07)
Basophils Absolute: 0 10*3/uL (ref 0.0–0.1)
Basophils Relative: 1 %
Eosinophils Absolute: 0.2 10*3/uL (ref 0.0–0.5)
Eosinophils Relative: 3 %
HCT: 42.8 % (ref 36.0–46.0)
Hemoglobin: 13.7 g/dL (ref 12.0–15.0)
Immature Granulocytes: 0 %
Lymphocytes Relative: 25 %
Lymphs Abs: 1.4 10*3/uL (ref 0.7–4.0)
MCH: 31.4 pg (ref 26.0–34.0)
MCHC: 32 g/dL (ref 30.0–36.0)
MCV: 98.2 fL (ref 80.0–100.0)
Monocytes Absolute: 0.3 10*3/uL (ref 0.1–1.0)
Monocytes Relative: 5 %
Neutro Abs: 3.8 10*3/uL (ref 1.7–7.7)
Neutrophils Relative %: 66 %
Platelets: 348 10*3/uL (ref 150–400)
RBC: 4.36 MIL/uL (ref 3.87–5.11)
RDW: 15.1 % (ref 11.5–15.5)
WBC: 5.7 10*3/uL (ref 4.0–10.5)
nRBC: 0 % (ref 0.0–0.2)

## 2024-02-18 MED ORDER — VALACYCLOVIR HCL 1 G PO TABS: 1000.0000 mg | ORAL_TABLET | Freq: Three times a day (TID) | ORAL | 0 refills | Status: AC

## 2024-02-18 MED ORDER — VALACYCLOVIR HCL 500 MG PO TABS
1000.0000 mg | ORAL_TABLET | Freq: Once | ORAL | Status: AC
  Administered 2024-02-18: 1000 mg via ORAL
  Filled 2024-02-18: qty 2

## 2024-02-18 MED ORDER — OXYCODONE-ACETAMINOPHEN 5-325 MG PO TABS
1.0000 | ORAL_TABLET | Freq: Once | ORAL | Status: AC
  Administered 2024-02-18: 1 via ORAL
  Filled 2024-02-18: qty 1

## 2024-02-18 NOTE — ED Triage Notes (Addendum)
 Patient arrived POV complaining of a burning sensation on the skin of her abdomen. She states she had a cholecystectomy Thursday. Rash began Friday. Patient states she has not taken any of her home meds since except insulin  because she thought she was having an allergic reaction.

## 2024-02-18 NOTE — ED Provider Notes (Signed)
 " [facility] Provider Note   CSN: [id number] Arrival date & time: 02/18/24  9070     Patient presents with: Post-op Problem   Kennedi AMANADA PHILBRICK is a 54 y.o. female.   54 year old female presents for evaluation of rash.  Recently had a cholecystectomy and a few days ago developed a rash over her right upper quadrant.  She states that it is burning and radiates to her back.  States is very tender.  She denies any nausea or vomiting or any other symptoms or concerns at this time.        Prior to Admission medications  Medication Sig Start Date End Date Taking? Authorizing Provider  LANTUS  100 UNIT/ML injection Inject 0.3 mLs (30 Units total) into the skin at bedtime. 02/09/24  Yes Emokpae, Courage, MD  valACYclovir  (VALTREX ) 1000 MG tablet Take 1 tablet (1,000 mg total) by mouth 3 (three) times daily for 7 days. 02/18/24 02/25/24 Yes Jazzalyn Loewenstein L, DO  Acetaminophen  325 MG CAPS Take 2 capsules by mouth every 6 (six) hours as needed. 02/09/24   Pearlean Manus, MD  amLODipine  (NORVASC ) 5 MG tablet Take 1 tablet (5 mg total) by mouth daily. 02/09/24   Pearlean Manus, MD  atorvastatin  (LIPITOR) 20 MG tablet Take 1 tablet (20 mg total) by mouth daily. 02/09/24   Pearlean Manus, MD  carvedilol  (COREG ) 3.125 MG tablet Take 1 tablet (3.125 mg total) by mouth 2 (two) times daily with a meal. 02/09/24   Pearlean Manus, MD  Insulin  Syringe-Needle U-100 25G X 5/8 1 ML MISC 1 each by Does not apply route 3 (three) times daily. May dispense any manufacturer covered by patient's insurance. 11/27/23   Ricky Fines, MD  Lancets MISC 1 each by Does not apply route 3 (three) times daily. Use as directed to check blood sugar. May dispense any manufacturer covered by patient's insurance and fits patient's device. 11/27/23   Ricky Fines, MD  levothyroxine  (SYNTHROID ) 50 MCG tablet Take 1 tablet (50 mcg total) by mouth daily. 02/09/24 02/08/25  Pearlean Manus, MD   ondansetron  (ZOFRAN ) 4 MG tablet Take 1 tablet (4 mg total) by mouth every 8 (eight) hours as needed for nausea or vomiting. 02/08/24   Pappayliou, Dorothyann A, DO  oxyCODONE  (ROXICODONE ) 5 MG immediate release tablet Take 1 tablet (5 mg total) by mouth every 6 (six) hours as needed. 02/08/24   Pappayliou, Dorothyann A, DO  senna-docusate (SENOKOT-S) 8.6-50 MG tablet Take 2 tablets by mouth at bedtime. 02/09/24   Pearlean Manus, MD    Allergies: Patient has no known allergies.    Review of Systems  Constitutional:  Negative for chills and fever.  HENT:  Negative for ear pain and sore throat.   Eyes:  Negative for pain and visual disturbance.  Respiratory:  Negative for cough and shortness of breath.   Cardiovascular:  Negative for chest pain and palpitations.  Gastrointestinal:  Negative for abdominal pain and vomiting.  Genitourinary:  Negative for dysuria and hematuria.  Musculoskeletal:  Negative for arthralgias and back pain.  Skin:  Positive for rash. Negative for color change.  Neurological:  Negative for seizures and syncope.  All other systems reviewed and are negative.   Updated Vital Signs BP (!) 176/125 (BP Location: Right Arm)   Pulse (!) 113   Temp 98.4 F (36.9 C) (Oral)   Resp 18   Ht 5' 1 (1.549 m)   Wt 74.3 kg   LMP 07/18/2021  SpO2 99%   BMI 30.95 kg/m   Physical Exam Vitals and nursing note reviewed.  Constitutional:      General: She is not in acute distress.    Appearance: Normal appearance. She is well-developed. She is not ill-appearing or diaphoretic.  HENT:     Head: Normocephalic and atraumatic.  Eyes:     Conjunctiva/sclera: Conjunctivae normal.  Cardiovascular:     Rate and Rhythm: Normal rate and regular rhythm.     Heart sounds: No murmur heard. Pulmonary:     Effort: Pulmonary effort is normal. No respiratory distress.     Breath sounds: Normal breath sounds.  Abdominal:     Palpations: Abdomen is soft.     Tenderness: There is no  abdominal tenderness.  Musculoskeletal:        General: No swelling.     Cervical back: Neck supple.  Skin:    General: Skin is warm.     Capillary Refill: Capillary refill takes less than 2 seconds.     Findings: Rash present.     Comments: Rash over right upper quadrant that does not cross midline, it is an excoriated appearing area with blister like rash consistent with shingles  Neurological:     Mental Status: She is alert.  Psychiatric:        Mood and Affect: Mood normal.     (all labs ordered are listed, but only abnormal results are displayed) Labs Reviewed  COMPREHENSIVE METABOLIC PANEL WITH GFR - Abnormal; Notable for the following components:      Result Value   Glucose, Bld 257 (*)    Creatinine, Ser 1.92 (*)    Albumin  3.4 (*)    Alkaline Phosphatase 204 (*)    GFR, Estimated 31 (*)    Anion gap 17 (*)    All other components within normal limits  CBC WITH DIFFERENTIAL/PLATELET    EKG: None  Radiology: No results found.   Procedures   Medications Ordered in the ED  oxyCODONE -acetaminophen  (PERCOCET/ROXICET) 5-325 MG per tablet 1 tablet (has no administration in time range)  valACYclovir  (VALTREX ) tablet 1,000 mg (has no administration in time range)                                    Medical Decision Making Patient with shingles.  Lab workup fairly unremarkable and baseline for this patient.  Was given valacyclovir  and oxycodone  in the ER.  Will be given a prescription for Valtrex  and advise close follow-up with primary care doctor and Tylenol  Motrin  as needed for pain.  Advised otherwise return for new or worsening symptoms.  She was comfortably discharged home.  Problems Addressed: Herpes zoster without complication: acute illness or injury  Amount and/or Complexity of Data Reviewed External Data Reviewed: notes.    Details: Prior hospital records reviewed and patient with recent cholecystectomy without complications Labs: ordered.  Decision-making details documented in ED Course.    Details: Ordered and reviewed by me and are baseline for the patient  Risk OTC drugs. Prescription drug management.     Final diagnoses:  Herpes zoster without complication    ED Discharge Orders          Ordered    valACYclovir  (VALTREX ) 1000 MG tablet  3 times daily        02/18/24 [address] Landri Dorsainvil L,  DO 02/18/24 1055  "

## 2024-02-21 ENCOUNTER — Ambulatory Visit (INDEPENDENT_AMBULATORY_CARE_PROVIDER_SITE_OTHER): Admitting: Surgery

## 2024-02-21 DIAGNOSIS — Z09 Encounter for follow-up examination after completed treatment for conditions other than malignant neoplasm: Secondary | ICD-10-CM

## 2024-02-21 NOTE — Progress Notes (Signed)
 [facility]  I am calling the patient for post operative evaluation. This is not a billable encounter as it is under the global charges for the surgery.  The patient had a robotic assisted laparoscopic cholecystectomy on 1/22. The patient reports that she is doing well. She is tolerating a diet, having good pain control, and having regular Bms.  The incisions are healing well. The patient has no concerns.  She was seen in the ED for right sided abdominal shingles on 2/2, and she is currently taking Valtrex .  She has no complaints regarding this.  Pathology: A. GALLBLADDER, CHOLECYSTECTOMY:       Chronic cholecystitis.       Gallbladder with muscular atrophy and marked luminal dilation  compatible with biliary dyskinesia.   Will see the patient PRN.   Dorothyann Brittle, DO [facility] [address] [place] [place], [place] [address] [phone] (office)

## 2024-02-22 ENCOUNTER — Ambulatory Visit: Admitting: Student

## 2024-02-22 ENCOUNTER — Encounter: Payer: Self-pay | Admitting: Student

## 2024-02-22 VITALS — BP 152/88 | HR 68 | Ht 61.0 in | Wt 163.2 lb

## 2024-02-22 DIAGNOSIS — N1832 Chronic kidney disease, stage 3b: Secondary | ICD-10-CM

## 2024-02-22 DIAGNOSIS — I272 Pulmonary hypertension, unspecified: Secondary | ICD-10-CM

## 2024-02-22 DIAGNOSIS — I5020 Unspecified systolic (congestive) heart failure: Secondary | ICD-10-CM

## 2024-02-22 MED ORDER — CARVEDILOL 3.125 MG PO TABS: 3.1250 mg | ORAL_TABLET | Freq: Two times a day (BID) | ORAL | 3 refills | Status: AC

## 2024-02-22 MED ORDER — HYDRALAZINE HCL 25 MG PO TABS: 25.0000 mg | ORAL_TABLET | Freq: Two times a day (BID) | ORAL | 3 refills | Status: AC

## 2024-02-22 NOTE — Patient Instructions (Signed)
 Medication Instructions:  Your physician has recommended you make the following change in your medication:   -Start Hydralazine  25 mg twice daily   *If you need a refill on your cardiac medications before your next appointment, please call your pharmacy*  Lab Work: None If you have labs (blood work) drawn today and your tests are completely normal, you will receive your results only by: MyChart Message (if you have MyChart) OR A paper copy in the mail If you have any lab test that is abnormal or we need to change your treatment, we will call you to review the results.  Testing/Procedures: None  Follow-Up: At [facility], you and your health needs are our priority.  As part of our continuing mission to provide you with exceptional heart care, our providers are all part of one team.  This team includes your primary Cardiologist (physician) and Advanced Practice Providers or APPs (Physician Assistants and Nurse Practitioners) who all work together to provide you with the care you need, when you need it.  Your next appointment:   4 week(s)  Provider:   You may see Dorn Ross, MD or one of the following Advanced Practice Providers on your designated Care Team:   Laymon Qua, PA-C  [place], [place] Olivia [place]avy, [place]     We recommend signing up for the patient portal called MyChart.  Sign up information is provided on this After Visit Summary.  MyChart is used to connect with patients for Virtual Visits (Telemedicine).  Patients are able to view lab/test results, encounter notes, upcoming appointments, etc.  Non-urgent messages can be sent to your provider as well.   To learn more about what you can do with MyChart, go to [email or web address].   Other Instructions Thank you for choosing [facility]!

## 2024-02-22 NOTE — Progress Notes (Unsigned)
 "  Cardiology Office Note    Date:  02/22/2024  ID:  Megan Mullins, DOB Feb 11, 1970, MRN [id number] Cardiologist: None { :  History of Present Illness:    Megan Mullins is a 54 y.o. female with past medical history of HTN, Type II DM, Stage III CKD, newly diagnosed HFrEF and polysubstance use who presents to the office today for hospital follow-up.  She was most recently admitted to [facility] from 1/21 - 02/09/2024 for biliary dyskinesia and worsening abdominal pain. Ultimately underwent robotic assisted laparoscopic cholecystectomy on 02/07/2024. Cardiology was consulted during admission as she had acute hypoxic respiratory failure in the postoperative setting and repeat echocardiogram showed a severely reduced EF of 20 to 25% with mildly reduced RV function, severely elevated PASP, moderate to severe MR and severe TR. She received IV Lasix  with improvement in symptoms and was transitioned to Lasix  40 mg daily at discharge. Her renal function limited the use of ACE-I/ARB/ARB/ARNI/MRA and given recent cocaine use, was started on nonselective beta-blocker therapy with Coreg . Was recommended to consider the use of Hydralazine /Nitrates at follow-up. Was recommend to follow renal function as an outpatient to assess candidacy for cardiac catheterization. Creatinine peaked at 2.90 during admission and was rechecked on 02/18/2024 and had improved to 1.92.  In talking the patient and her daughter today, she reports she was diagnosed with shingles earlier this week and is having pain along her rash.  Was started on Valtrex  at that time.  Denies any recurrent abdominal pain besides this since her surgery.  Reports her breathing has been stable and denies any dyspnea on exertion, orthopnea, PND or pitting edema.  No recent exertional chest pain or palpitations.  Says that she has not consumed alcohol in several weeks and has reduced her tobacco use significantly.  She also denies any recurrent cocaine use and says she  was previously using several times a week.   Studies Reviewed:   EKG: EKG is*** ordered today and demonstrates ***   EKG Interpretation Date/Time:    Ventricular Rate:    PR Interval:    QRS Duration:    QT Interval:    QTC Calculation:   R Axis:      Text Interpretation:         Echocardiogram: 01/2024 IMPRESSIONS     1. Left ventricular ejection fraction, by estimation, is 20 to 25%. The  left ventricle has severely decreased function. The left ventricle  demonstrates global hypokinesis. There is mild left ventricular  hypertrophy. Left ventricular diastolic parameters   are indeterminate.   2. Right ventricular systolic function is mildly reduced. The right  ventricular size is moderately enlarged. There is severely elevated  pulmonary artery systolic pressure.   3. Left atrial size was moderately dilated.   4. Right atrial size was moderately dilated.   5. The mitral valve is abnormal. Moderate to severe mitral valve  regurgitation. No evidence of mitral stenosis.   6. The tricuspid valve is abnormal. Tricuspid valve regurgitation is  severe.   7. The aortic valve is tricuspid. Aortic valve regurgitation is not  visualized. No aortic stenosis is present.   8. The inferior vena cava is dilated in size with <50% respiratory  variability, suggesting right atrial pressure of 15 mmHg.    Risk Assessment/Calculations:   {Does this patient have ATRIAL FIBRILLATION?[phone]} No BP recorded.  {Refresh Note OR Click here to enter BP  :1}***         Physical Exam:   VS:  LMP 07/18/2021    Wt Readings from Last 3 Encounters:  02/18/24 163 lb 12.8 oz (74.3 kg)  02/09/24 176 lb 9.4 oz (80.1 kg)  11/26/23 175 lb (79.4 kg)     GEN: Well nourished, well developed in no acute distress NECK: No JVD; No carotid bruits CARDIAC: ***RRR, no murmurs, rubs, gallops RESPIRATORY:  Clear to auscultation without rales, wheezing or rhonchi  ABDOMEN: Appears non-distended. No  obvious abdominal masses. EXTREMITIES: No clubbing or cyanosis. No edema.  Distal pedal pulses are 2+ bilaterally.   Assessment and Plan:   1. HFrEF (heart failure with reduced ejection fraction) (HCC) - Echocardiogram during recent admission showed her EF was severely reduced at 20 to 25% with mild RV dysfunction and severe pulmonary hypertension with moderate to severe MR and severe TR. Felt to possibly be due to substance use in the setting of cocaine use and alcohol use. - Cardiac catheterization was not pursued during admission given her CKD and recent laparoscopic cholecystectomy. ***  2. Pulmonary hypertension (HCC) - PASP was severely elevated at 68.9 mmHg by recent echocardiogram in 01/2024 and this was in the setting of cocaine use and LV dysfunction with EF at 20 to 25%. Also has suspected OSA given nocturnal desaturations during her admission. ***  3. CKD stage 3b, GFR 30-44 ml/min (HCC) - Baseline creatinine appears to be around 1.6 - 1.8. Peaked at 2.90 during her recent admission and had improved to 1.92 when checked on 02/18/2024.   Signed, Laymon CHRISTELLA Qua, PA-C   "

## 2024-03-03 ENCOUNTER — Ambulatory Visit: Payer: Self-pay

## 2024-03-26 ENCOUNTER — Ambulatory Visit: Admitting: Student
